# Patient Record
Sex: Female | Born: 1935
Health system: Southern US, Community
[De-identification: ages and names within clinical notes are randomized; demographics above are authoritative.]

## PROBLEM LIST (undated history)

## (undated) DIAGNOSIS — I10 Essential (primary) hypertension: Secondary | ICD-10-CM

## (undated) DIAGNOSIS — M810 Age-related osteoporosis without current pathological fracture: Secondary | ICD-10-CM

## (undated) DIAGNOSIS — F419 Anxiety disorder, unspecified: Secondary | ICD-10-CM

## (undated) DIAGNOSIS — K2970 Gastritis, unspecified, without bleeding: Secondary | ICD-10-CM

## (undated) DIAGNOSIS — J449 Chronic obstructive pulmonary disease, unspecified: Secondary | ICD-10-CM

## (undated) DIAGNOSIS — K219 Gastro-esophageal reflux disease without esophagitis: Secondary | ICD-10-CM

## (undated) DIAGNOSIS — R0600 Dyspnea, unspecified: Secondary | ICD-10-CM

## (undated) DIAGNOSIS — M1990 Unspecified osteoarthritis, unspecified site: Secondary | ICD-10-CM

## (undated) HISTORY — PX: COLONOSCOPY: SHX174

## (undated) HISTORY — PX: OTHER SURGICAL HISTORY: SHX169

## (undated) HISTORY — PX: LAPAROSCOPY: SHX197

---

## 2004-11-07 ENCOUNTER — Ambulatory Visit: Payer: Self-pay | Admitting: Internal Medicine

## 2005-04-27 ENCOUNTER — Ambulatory Visit: Payer: Self-pay | Admitting: Internal Medicine

## 2005-08-10 ENCOUNTER — Emergency Department: Payer: Self-pay | Admitting: Emergency Medicine

## 2005-08-10 ENCOUNTER — Other Ambulatory Visit: Payer: Self-pay

## 2005-08-17 ENCOUNTER — Ambulatory Visit: Payer: Self-pay | Admitting: Unknown Physician Specialty

## 2006-01-23 ENCOUNTER — Other Ambulatory Visit: Payer: Self-pay

## 2006-01-23 ENCOUNTER — Emergency Department: Payer: Self-pay | Admitting: Emergency Medicine

## 2006-01-24 ENCOUNTER — Ambulatory Visit: Payer: Self-pay | Admitting: Emergency Medicine

## 2006-07-10 ENCOUNTER — Ambulatory Visit: Payer: Self-pay

## 2006-07-22 ENCOUNTER — Ambulatory Visit: Payer: Self-pay | Admitting: Internal Medicine

## 2006-10-25 ENCOUNTER — Ambulatory Visit: Payer: Self-pay | Admitting: Unknown Physician Specialty

## 2007-03-04 ENCOUNTER — Ambulatory Visit: Payer: Self-pay | Admitting: Ophthalmology

## 2007-03-25 ENCOUNTER — Other Ambulatory Visit: Payer: Self-pay

## 2007-03-25 ENCOUNTER — Ambulatory Visit: Payer: Self-pay | Admitting: Ophthalmology

## 2007-04-22 ENCOUNTER — Ambulatory Visit: Payer: Self-pay | Admitting: Ophthalmology

## 2007-04-29 ENCOUNTER — Ambulatory Visit: Payer: Self-pay | Admitting: Ophthalmology

## 2007-08-13 ENCOUNTER — Ambulatory Visit: Payer: Self-pay | Admitting: Internal Medicine

## 2008-08-17 ENCOUNTER — Ambulatory Visit: Payer: Self-pay | Admitting: Internal Medicine

## 2008-11-21 ENCOUNTER — Emergency Department: Payer: Self-pay | Admitting: Internal Medicine

## 2009-08-18 ENCOUNTER — Ambulatory Visit: Payer: Self-pay | Admitting: Internal Medicine

## 2010-04-17 ENCOUNTER — Observation Stay: Payer: Self-pay | Admitting: Internal Medicine

## 2010-06-01 ENCOUNTER — Ambulatory Visit: Payer: Self-pay | Admitting: Internal Medicine

## 2010-06-13 ENCOUNTER — Ambulatory Visit: Payer: Self-pay | Admitting: Unknown Physician Specialty

## 2010-06-15 ENCOUNTER — Ambulatory Visit: Payer: Self-pay | Admitting: Internal Medicine

## 2010-08-25 ENCOUNTER — Ambulatory Visit: Payer: Self-pay

## 2011-07-10 ENCOUNTER — Emergency Department: Payer: Self-pay | Admitting: Emergency Medicine

## 2011-11-08 ENCOUNTER — Ambulatory Visit: Payer: Self-pay | Admitting: Internal Medicine

## 2012-11-10 ENCOUNTER — Ambulatory Visit: Payer: Self-pay | Admitting: Internal Medicine

## 2014-01-05 ENCOUNTER — Ambulatory Visit: Payer: Self-pay | Admitting: Internal Medicine

## 2014-05-03 ENCOUNTER — Inpatient Hospital Stay: Payer: Self-pay | Admitting: Internal Medicine

## 2014-05-03 LAB — URINALYSIS, COMPLETE
BACTERIA: NONE SEEN
Bilirubin,UR: NEGATIVE
Blood: NEGATIVE
GLUCOSE, UR: NEGATIVE mg/dL (ref 0–75)
KETONE: NEGATIVE
LEUKOCYTE ESTERASE: NEGATIVE
NITRITE: NEGATIVE
Ph: 9 (ref 4.5–8.0)
Protein: NEGATIVE
RBC,UR: 1 /HPF (ref 0–5)
Specific Gravity: 1.01 (ref 1.003–1.030)
Squamous Epithelial: NONE SEEN
WBC UR: NONE SEEN /HPF (ref 0–5)

## 2014-05-03 LAB — COMPREHENSIVE METABOLIC PANEL
ALT: 27 U/L (ref 12–78)
AST: 38 U/L — AB (ref 15–37)
Albumin: 4 g/dL (ref 3.4–5.0)
Alkaline Phosphatase: 91 U/L
Anion Gap: 8 (ref 7–16)
BUN: 10 mg/dL (ref 7–18)
Bilirubin,Total: 0.6 mg/dL (ref 0.2–1.0)
CHLORIDE: 89 mmol/L — AB (ref 98–107)
Calcium, Total: 9.9 mg/dL (ref 8.5–10.1)
Co2: 28 mmol/L (ref 21–32)
Creatinine: 0.76 mg/dL (ref 0.60–1.30)
Glucose: 131 mg/dL — ABNORMAL HIGH (ref 65–99)
Osmolality: 252 (ref 275–301)
Potassium: 4.3 mmol/L (ref 3.5–5.1)
Sodium: 125 mmol/L — ABNORMAL LOW (ref 136–145)
Total Protein: 7.3 g/dL (ref 6.4–8.2)

## 2014-05-03 LAB — CBC
HCT: 37.5 % (ref 35.0–47.0)
HGB: 12.5 g/dL (ref 12.0–16.0)
MCH: 31.3 pg (ref 26.0–34.0)
MCHC: 33.4 g/dL (ref 32.0–36.0)
MCV: 94 fL (ref 80–100)
PLATELETS: 262 10*3/uL (ref 150–440)
RBC: 4 10*6/uL (ref 3.80–5.20)
RDW: 13.6 % (ref 11.5–14.5)
WBC: 5.9 10*3/uL (ref 3.6–11.0)

## 2014-05-03 LAB — TSH: THYROID STIMULATING HORM: 2.74 u[IU]/mL

## 2014-05-04 LAB — BASIC METABOLIC PANEL
ANION GAP: 10 (ref 7–16)
BUN: 7 mg/dL (ref 7–18)
CO2: 24 mmol/L (ref 21–32)
CREATININE: 0.6 mg/dL (ref 0.60–1.30)
Calcium, Total: 9.4 mg/dL (ref 8.5–10.1)
Chloride: 96 mmol/L — ABNORMAL LOW (ref 98–107)
EGFR (African American): >60
EGFR (Non-African Amer.): >60
GLUCOSE: 118 mg/dL — AB (ref 65–99)
Osmolality: 260 (ref 275–301)
POTASSIUM: 3.4 mmol/L — AB (ref 3.5–5.1)
Sodium: 130 mmol/L — ABNORMAL LOW (ref 136–145)

## 2014-05-04 LAB — CBC WITH DIFFERENTIAL/PLATELET
BASOS ABS: 0 10*3/uL (ref 0.0–0.1)
Basophil %: 0.3 %
Eosinophil #: 0 10*3/uL (ref 0.0–0.7)
Eosinophil %: 0.1 %
HCT: 39.7 % (ref 35.0–47.0)
HGB: 13.3 g/dL (ref 12.0–16.0)
LYMPHS ABS: 1 10*3/uL (ref 1.0–3.6)
LYMPHS PCT: 12.9 %
MCH: 31 pg (ref 26.0–34.0)
MCHC: 33.6 g/dL (ref 32.0–36.0)
MCV: 92 fL (ref 80–100)
MONOS PCT: 6.9 %
Monocyte #: 0.5 x10 3/mm (ref 0.2–0.9)
Neutrophil #: 5.9 10*3/uL (ref 1.4–6.5)
Neutrophil %: 79.8 %
Platelet: 298 10*3/uL (ref 150–440)
RBC: 4.3 10*6/uL (ref 3.80–5.20)
RDW: 13.7 % (ref 11.5–14.5)
WBC: 7.4 10*3/uL (ref 3.6–11.0)

## 2014-05-04 LAB — LIPID PANEL
Cholesterol: 193 mg/dL (ref 0–200)
HDL: 90 mg/dL — AB (ref 40–60)
Ldl Cholesterol, Calc: 93 mg/dL (ref 0–100)
TRIGLYCERIDES: 52 mg/dL (ref 0–200)
VLDL CHOLESTEROL, CALC: 10 mg/dL (ref 5–40)

## 2014-05-04 LAB — MAGNESIUM: MAGNESIUM: 2 mg/dL

## 2014-05-04 LAB — TSH: Thyroid Stimulating Horm: 2.44 u[IU]/mL

## 2014-10-19 DIAGNOSIS — J301 Allergic rhinitis due to pollen: Secondary | ICD-10-CM | POA: Diagnosis not present

## 2014-10-19 DIAGNOSIS — I119 Hypertensive heart disease without heart failure: Secondary | ICD-10-CM | POA: Diagnosis not present

## 2014-10-19 DIAGNOSIS — E039 Hypothyroidism, unspecified: Secondary | ICD-10-CM | POA: Diagnosis not present

## 2014-10-19 DIAGNOSIS — M150 Primary generalized (osteo)arthritis: Secondary | ICD-10-CM | POA: Diagnosis not present

## 2014-12-02 DIAGNOSIS — F419 Anxiety disorder, unspecified: Secondary | ICD-10-CM | POA: Diagnosis not present

## 2014-12-02 DIAGNOSIS — I10 Essential (primary) hypertension: Secondary | ICD-10-CM | POA: Diagnosis not present

## 2014-12-02 DIAGNOSIS — E039 Hypothyroidism, unspecified: Secondary | ICD-10-CM | POA: Diagnosis not present

## 2014-12-06 DIAGNOSIS — R1013 Epigastric pain: Secondary | ICD-10-CM | POA: Diagnosis not present

## 2014-12-06 DIAGNOSIS — K219 Gastro-esophageal reflux disease without esophagitis: Secondary | ICD-10-CM | POA: Diagnosis not present

## 2014-12-06 DIAGNOSIS — Z8719 Personal history of other diseases of the digestive system: Secondary | ICD-10-CM | POA: Diagnosis not present

## 2014-12-10 DIAGNOSIS — K219 Gastro-esophageal reflux disease without esophagitis: Secondary | ICD-10-CM | POA: Insufficient documentation

## 2014-12-10 DIAGNOSIS — Z8719 Personal history of other diseases of the digestive system: Secondary | ICD-10-CM | POA: Insufficient documentation

## 2014-12-15 DIAGNOSIS — R1013 Epigastric pain: Secondary | ICD-10-CM | POA: Diagnosis not present

## 2015-01-20 DIAGNOSIS — M810 Age-related osteoporosis without current pathological fracture: Secondary | ICD-10-CM | POA: Diagnosis not present

## 2015-01-20 DIAGNOSIS — M150 Primary generalized (osteo)arthritis: Secondary | ICD-10-CM | POA: Diagnosis not present

## 2015-01-20 DIAGNOSIS — Z0001 Encounter for general adult medical examination with abnormal findings: Secondary | ICD-10-CM | POA: Diagnosis not present

## 2015-01-20 DIAGNOSIS — F411 Generalized anxiety disorder: Secondary | ICD-10-CM | POA: Diagnosis not present

## 2015-01-20 DIAGNOSIS — R300 Dysuria: Secondary | ICD-10-CM | POA: Diagnosis not present

## 2015-01-20 DIAGNOSIS — E039 Hypothyroidism, unspecified: Secondary | ICD-10-CM | POA: Diagnosis not present

## 2015-01-20 DIAGNOSIS — K219 Gastro-esophageal reflux disease without esophagitis: Secondary | ICD-10-CM | POA: Diagnosis not present

## 2015-02-05 NOTE — Discharge Summary (Signed)
PATIENT NAME:  Pam Cruz, BLASINGAME MR#:  [id number] DATE OF BIRTH:  1935/09/01  DATE OF ADMISSION:  05/03/2014 DATE OF DISCHARGE:  05/04/2014  PRIMARY CARE PHYSICIAN: Lavera Guise, MD  DISCHARGE DIAGNOSES: 1. Migraine.  2. Dehydration.  3. Hypertension.  4. Hyponatremia due to hydrochlorothiazide and dehydration.  5. Gastroesophageal reflux disease.  6. Arthritis.  7. Asthma.   DISCHARGE MEDICATIONS: 1. Levothyroxine 50 mcg oral once a day.  2. Desipramine 25 mg 1/2 tablet oral once a day at bedtime.  3. Tylenol arthritis twice a day as needed.  4. Omeprazole 40 mg daily.  5. Carafate 1 gram 4 times daily with meals.  6. Klonopin 0.5 mg by mouth at bedtime.  7. PreserVision 1 capsule oral once a day.  8. Calcium/vitamin D 1 tablet daily.  9. Montelukast 10 mg oral once a day.  10. Advair Diskus 1 puff inhaled 2 times a day.  11. Fluticasone propionate nasal twice a day.  12. Amlodipine 5 mg 2 tablets daily.  13. Hydralazine 50 mg oral 3 times a day.  14. Cozaar 100 mg oral once a day.  15. Fioricet 1 tablet oral every 6 hours as needed.  16. Zofran 4 mg oral every 6 hours as needed for nausea.   IMAGING STUDIES DONE: Include a CT scan of the head without contrast which showed no acute abnormalities. Mild atrophy.   ADMITTING HISTORY AND PHYSICAL: Please see detailed H and P dictated previously by Dr. Margaretmary Eddy. In brief, a 80 year old female patient with history of hypertension, asthma who presented to the hospital complaining of headache and intractable nausea. The patient had a CT scan of the head, which was normal and found to be hyponatremic; was admitted to the hospitalist service.   HOSPITAL COURSE: 1. Migraine. Patient had headaches, nausea, photophobia, was diagnosed with migraine after CT head was normal. Did not have any focal neurological deficits. She was treated with the prochlorperazine, Toradol and IV fluids, with which she improved well. Her headache had resolved by the  time of discharge. She ambulated in the hallway without any problems, felt back to baseline. She was treated symptomatically for her nausea and vomiting. Did not have any abdominal tenderness on examination prior to discharge. Motor strength was 5/5 in upper and lower extremities. Sensation was intact all over.  2. Hyponatremia. Secondary to hydrochlorothiazide and dehydration. The patient's hydrochlorothiazide was stopped. She was rehydrated with IV fluids with which her sodium is improving. She does seem to have some mild hyponatremia and this has been asymptomatic.  3. Uncontrolled hypertension with stopping her hydrochlorothiazide. The patient's blood pressure was uncontrolled but was treated with IV p.r.n. medications along with increasing Cozaar and amlodipine doses and added hydroxyzine, and blood pressure is much improved prior to discharge.   DISCHARGE INSTRUCTIONS:  1. Low-salt diet.  2. Activity as tolerated.  3. Follow up with primary care physician in 1 to 2 days.    TIME SPENT ON DISCHARGE: 45 minutes.    ____________________________ Leia Alf Murl Golladay, MD srs:lm D: 05/10/2014 14:00:20 ET T: 05/11/2014 01:28:43 ET JOB#: [id number]  cc: Alveta Heimlich R. Darvin Neighbours, MD, <Dictator> Lavera Guise, MD Neita Carp MD ELECTRONICALLY SIGNED 05/26/2014 15:44

## 2015-02-05 NOTE — H&P (Signed)
PATIENT NAME:  Pam Cruz, Pam Cruz MR#:  [id number] DATE OF BIRTH:  Jun 06, 1935  DATE OF ADMISSION:  05/04/2014  PRIMARY CARE PHYSICIAN: Dr. [name]   REFERRING PHYSICIAN: Dr. Jacqualine Code  CHIEF COMPLAINT: Headache and intractable nausea.   HISTORY OF PRESENT ILLNESS: The patient is a 80 year old, pleasant, thin-looking Caucasian female who is presenting to the ED with a chief complaint of headache and nausea. The patient is reporting that she woke up this morning with severe headache associated with nausea. She states that the whole day yesterday, she was unable to eat or drink because of the intractable nausea. She has some looseness of bowel movements, but denies any blood in her stool. Denies any vomiting. She had similar complaints approximately one month ago and was evaluated by her primary care physician, Dr. [name] PA, who ordered some lab work. The patient reports that, according to the lab work which was done recently, her sodium was low. The patient was recommended to drink Gatorade by the PA. Yesterday morning again, she started having severe headache associated with nausea. She called her primary care physician's office, and they took an appointment for tomorrow at 11:00 a.m. As the patient's nausea was persistent and that she was not able to eat or drink for the whole day yesterday, she was feeling dizzy. She was feeling extremely weak and tired also. She came into the ED, and her sodium was low at 1.85. The patient reports that she usually gets headache whenever her sinuses are acting up. She denies any shortness of breath, denies any chest pain. She has somewhat epigastric discomfort. She has chronic history of gastritis, and takes Carafate regarding that.   PAST MEDICAL HISTORY: Gastroesophageal reflux disease, chronic gastritis, chronic obstructive pulmonary disease, benign hypertension, osteoarthritis, osteoporosis, and hypothyroidism.   PAST SURGICAL HISTORY: None.   ALLERGIES: SHE  IS ALLERGIC TO AMOXICILLIN, CODEIN, DARVOCET-N, ERYTHROMYCIN, PENICILLIN, REGLAN, SULFA.  PSYCHOSOCIAL HISTORY: Lives at home, lives alone. She used to smoke, but quit smoking in the year 2004. Occasional intake of alcohol. Denies any illicit drug usage.   FAMILY HISTORY: Hypertension runs in her family. Also coronary artery disease runs in her family.   HOME MEDICATIONS: Tylenol Arthritis extended release 1 tablet p.r.n., omeprazole 40 mg p.o. once daily, montelukast 10 mg once daily, levothyroxine 50 mcg p.o. once daily, Klonopin 4.5 mg by mouth at bedtime, hydrochlorothiazide/losartan 12.5/50 once daily, desipramine 20 mg 1/2 tablet once a day, Carafate 1 gram 4 times a day, calcium with vitamin D daily, amlodipine 5 mg once daily, Advair 250/50 1 puff inhalation 2 times a day.   REVIEW OF SYSTEMS: CONSTITUTIONAL: Denies any fever, fatigue. Denies any weight loss or weight gain. The patient is chronically thin, as she watches her diet.  EYES: Denies blurry vision, double vision. Denies any glaucoma or cataracts.  EARS, NOSE, THROAT: Denies epistaxis, discharge, snoring.  SOCIAL HISTORY: Denies cough or wheezing. Has chronic history of chronic obstructive pulmonary disease.  CARDIOVASCULAR: Denies any chest pain or palpitations, but complaining of dizziness.  GASTROINTESTINAL: Complaining of persistent nausea. Denies any vomiting. Has loose bowel movements. Complaining of epigastric abdominal pain. No hematemesis or melena.  GENITOURINARY: No dysuria, hematuria, melena, calculi, or frequency.  GYNECOLOGIC AND BREASTS: Denies any breast mass or vaginal discharge.  ENDOCRINE: Denies any polyuria, nocturia. Has a chronic history of hypothyroidism.  HEMATOLOGIC AND LYMPHATIC: No anemia, easy bruising, bleeding.  INTEGUMENTARY: No acne, rash, lesions.  MUSCULOSKELETAL: No joint pain in the neck and back. Denies gout.  NEUROLOGIC: Denies vertigo, ataxia, dementia. PSYCHIATRIC: No ADD or OCD.    PHYSICAL EXAMINATION: VITAL SIGNS: Temperature 97.5, pulse 60, respirations 18, blood pressure 173/99, pulse oximetry is 100%.  GENERAL APPEARANCE: Not in acute distress. Moderately built and thin-looking female under no apparent distress.  HEENT: Normocephalic, atraumatic. Pupils are equal, reacting to light and accommodation. No scleral icterus. No conjunctival injection. No sinus tenderness. No postnasal drip. Dry mucous membranes.  NECK: Supple. No JVD. No thyromegaly. Range of motion is intact.  LUNGS: Clear to auscultation bilaterally. No accessory muscle use. No anterior chest wall tenderness on palpation.  CARDIAC: S1, S2 normal. Regular rate and rhythm. No murmurs.  ABDOMEN: Soft. Bowel sounds are positive in all four quadrants. Nontender, but positive epigastric discomfort. Not distended. No masses felt.  NEUROLOGIC: Awake, alert and oriented x3. Cranial nerves II through XII are grossly intact. Motor and sensory are intact. Reflexes are 2+.  EXTREMITIES: No edema. No cyanosis. No clubbing.  SKIN: Warm to touch. Normal turgor. No rashes. No lesions.  MUSCULOSKELETAL: No joint effusion, tenderness, erythema.  PSYCHIATRIC: Normal mood and affect.   LABORATORY AND IMAGING STUDIES: TSH is 2.74. CBC is normal. Urinalysis: Yellow in color, cloudy in appearance, nitrites are negative, leukocyte esterase is negative. LFTs are normal except AST of 38. BMP: Glucose 131, BUN 10, creatinine 0.76. Sodium is 125, but the patient is reporting that chronically her sodium is low, but she does not know the exact number. Potassium 4.3, chloride is 89. The rest of the BMP is normal. CT head is normal, with no acute findings.   ASSESSMENT AND PLAN: A 80 year old Caucasian female, presenting to the Emergency Department with a chief complaint of headache and nausea since yesterday morning. She could not tolerate oral intake for the whole day yesterday.   1.  Hyponatremia, probably from dehydration from  persistent nausea, as the patient was unable to tolerate oral intake, adverse effects of hydrochlorothiazide. The plan is provide her IV fluids, antinausea medications. Gastrointestinal prophylaxis with ranitidine and Carafate. Hold  hydrochlorothiazide. Repeat BMP. Check TSH. Provide IV fluids. Follow serial BMP. If necessary, will consider nephrology consult. We will try to get records from the primary care physician's office to get her baseline.  2.  Acute on chronic headaches, probably from elevated blood pressure as well as sinusitis. CT head is negative. Will provide her Tylenol as needed. 3.  Hypertension, elevated. Hold hydrochlorothiazide in view of hyponatremia. Increase amlodipine dose to 10 mg and continue losartan at 50 mg. Provide her IV Lopressor on as-needed basis.  4.  Chronic history of chronic obstructive pulmonary disease, not in exacerbation. We will provide nebulizer treatments on as-needed basis and continue Singulair.  5.  Chronic history of hypothyroidism. Continue Synthroid.  6.  Osteoporosis. Continue calcium with vitamin D supplements.  7.  Will provide gastrointestinal and deep vein thrombosis prophylaxis.   Plan of care discussed with the patient in detail. She is aware of the plan.   CODE STATUS: She is full code. Brother is the medical power of attorney.   TOTAL TIME SPENT ON ADMISSION: 50 minutes.    ____________________________ Nicholes Mango, MD ag:cg D: 05/04/2014 92:11:94 ET T: 05/04/2014 00:56:16 ET JOB#: [id number]  cc: Nicholes Mango, MD, <Dictator> Lavera Guise, MD Nicholes Mango MD ELECTRONICALLY SIGNED 05/05/2014 2:01

## 2015-03-10 DIAGNOSIS — Z1231 Encounter for screening mammogram for malignant neoplasm of breast: Secondary | ICD-10-CM | POA: Diagnosis not present

## 2015-04-19 DIAGNOSIS — H3530 Unspecified macular degeneration: Secondary | ICD-10-CM | POA: Diagnosis not present

## 2015-05-24 DIAGNOSIS — M810 Age-related osteoporosis without current pathological fracture: Secondary | ICD-10-CM | POA: Diagnosis not present

## 2015-05-24 DIAGNOSIS — F411 Generalized anxiety disorder: Secondary | ICD-10-CM | POA: Diagnosis not present

## 2015-05-24 DIAGNOSIS — M150 Primary generalized (osteo)arthritis: Secondary | ICD-10-CM | POA: Diagnosis not present

## 2015-05-24 DIAGNOSIS — I119 Hypertensive heart disease without heart failure: Secondary | ICD-10-CM | POA: Diagnosis not present

## 2015-05-24 DIAGNOSIS — G2581 Restless legs syndrome: Secondary | ICD-10-CM | POA: Diagnosis not present

## 2015-07-28 DIAGNOSIS — M8580 Other specified disorders of bone density and structure, unspecified site: Secondary | ICD-10-CM | POA: Diagnosis not present

## 2015-07-28 DIAGNOSIS — M810 Age-related osteoporosis without current pathological fracture: Secondary | ICD-10-CM | POA: Diagnosis not present

## 2015-07-28 DIAGNOSIS — M8588 Other specified disorders of bone density and structure, other site: Secondary | ICD-10-CM | POA: Diagnosis not present

## 2015-07-28 DIAGNOSIS — E2839 Other primary ovarian failure: Secondary | ICD-10-CM | POA: Diagnosis not present

## 2015-07-28 DIAGNOSIS — Z780 Asymptomatic menopausal state: Secondary | ICD-10-CM | POA: Diagnosis not present

## 2015-09-27 DIAGNOSIS — E039 Hypothyroidism, unspecified: Secondary | ICD-10-CM | POA: Diagnosis not present

## 2015-09-27 DIAGNOSIS — F411 Generalized anxiety disorder: Secondary | ICD-10-CM | POA: Diagnosis not present

## 2015-09-27 DIAGNOSIS — M810 Age-related osteoporosis without current pathological fracture: Secondary | ICD-10-CM | POA: Diagnosis not present

## 2015-09-27 DIAGNOSIS — I119 Hypertensive heart disease without heart failure: Secondary | ICD-10-CM | POA: Diagnosis not present

## 2015-09-27 DIAGNOSIS — M150 Primary generalized (osteo)arthritis: Secondary | ICD-10-CM | POA: Diagnosis not present

## 2015-10-26 DIAGNOSIS — K219 Gastro-esophageal reflux disease without esophagitis: Secondary | ICD-10-CM | POA: Diagnosis not present

## 2015-10-26 DIAGNOSIS — K589 Irritable bowel syndrome without diarrhea: Secondary | ICD-10-CM | POA: Diagnosis not present

## 2015-10-31 DIAGNOSIS — E039 Hypothyroidism, unspecified: Secondary | ICD-10-CM | POA: Diagnosis not present

## 2015-10-31 DIAGNOSIS — I10 Essential (primary) hypertension: Secondary | ICD-10-CM | POA: Diagnosis not present

## 2015-12-06 DIAGNOSIS — K219 Gastro-esophageal reflux disease without esophagitis: Secondary | ICD-10-CM | POA: Diagnosis not present

## 2015-12-06 DIAGNOSIS — E039 Hypothyroidism, unspecified: Secondary | ICD-10-CM | POA: Diagnosis not present

## 2015-12-06 DIAGNOSIS — M810 Age-related osteoporosis without current pathological fracture: Secondary | ICD-10-CM | POA: Diagnosis not present

## 2015-12-06 DIAGNOSIS — I119 Hypertensive heart disease without heart failure: Secondary | ICD-10-CM | POA: Diagnosis not present

## 2016-01-30 DIAGNOSIS — E782 Mixed hyperlipidemia: Secondary | ICD-10-CM | POA: Diagnosis not present

## 2016-01-30 DIAGNOSIS — I10 Essential (primary) hypertension: Secondary | ICD-10-CM | POA: Diagnosis not present

## 2016-01-30 DIAGNOSIS — E039 Hypothyroidism, unspecified: Secondary | ICD-10-CM | POA: Diagnosis not present

## 2016-01-30 DIAGNOSIS — Z0001 Encounter for general adult medical examination with abnormal findings: Secondary | ICD-10-CM | POA: Diagnosis not present

## 2016-02-07 DIAGNOSIS — I10 Essential (primary) hypertension: Secondary | ICD-10-CM | POA: Diagnosis not present

## 2016-02-07 DIAGNOSIS — Z0001 Encounter for general adult medical examination with abnormal findings: Secondary | ICD-10-CM | POA: Diagnosis not present

## 2016-02-07 DIAGNOSIS — E039 Hypothyroidism, unspecified: Secondary | ICD-10-CM | POA: Diagnosis not present

## 2016-02-07 DIAGNOSIS — E875 Hyperkalemia: Secondary | ICD-10-CM | POA: Diagnosis not present

## 2016-03-02 DIAGNOSIS — E875 Hyperkalemia: Secondary | ICD-10-CM | POA: Diagnosis not present

## 2016-03-26 DIAGNOSIS — M545 Low back pain: Secondary | ICD-10-CM | POA: Diagnosis not present

## 2016-03-26 DIAGNOSIS — J209 Acute bronchitis, unspecified: Secondary | ICD-10-CM | POA: Diagnosis not present

## 2016-04-03 DIAGNOSIS — J4540 Moderate persistent asthma, uncomplicated: Secondary | ICD-10-CM | POA: Diagnosis not present

## 2016-04-03 DIAGNOSIS — J301 Allergic rhinitis due to pollen: Secondary | ICD-10-CM | POA: Diagnosis not present

## 2016-06-12 DIAGNOSIS — J449 Chronic obstructive pulmonary disease, unspecified: Secondary | ICD-10-CM | POA: Diagnosis not present

## 2016-06-12 DIAGNOSIS — E039 Hypothyroidism, unspecified: Secondary | ICD-10-CM | POA: Diagnosis not present

## 2016-06-12 DIAGNOSIS — I10 Essential (primary) hypertension: Secondary | ICD-10-CM | POA: Diagnosis not present

## 2016-06-12 DIAGNOSIS — M810 Age-related osteoporosis without current pathological fracture: Secondary | ICD-10-CM | POA: Diagnosis not present

## 2016-06-19 DIAGNOSIS — Z1231 Encounter for screening mammogram for malignant neoplasm of breast: Secondary | ICD-10-CM | POA: Diagnosis not present

## 2016-10-02 DIAGNOSIS — I10 Essential (primary) hypertension: Secondary | ICD-10-CM | POA: Diagnosis not present

## 2016-10-02 DIAGNOSIS — K219 Gastro-esophageal reflux disease without esophagitis: Secondary | ICD-10-CM | POA: Diagnosis not present

## 2016-10-02 DIAGNOSIS — M150 Primary generalized (osteo)arthritis: Secondary | ICD-10-CM | POA: Diagnosis not present

## 2016-10-02 DIAGNOSIS — E039 Hypothyroidism, unspecified: Secondary | ICD-10-CM | POA: Diagnosis not present

## 2016-11-01 ENCOUNTER — Emergency Department
Admission: EM | Admit: 2016-11-01 | Discharge: 2016-11-01 | Disposition: A | Discharge status: Home / Self Care | Payer: Medicare Other | Attending: Emergency Medicine | Admitting: Emergency Medicine

## 2016-11-01 ENCOUNTER — Emergency Department: Payer: Medicare Other

## 2016-11-01 ENCOUNTER — Encounter: Payer: Self-pay | Admitting: Emergency Medicine

## 2016-11-01 DIAGNOSIS — R079 Chest pain, unspecified: Secondary | ICD-10-CM | POA: Diagnosis not present

## 2016-11-01 DIAGNOSIS — I10 Essential (primary) hypertension: Secondary | ICD-10-CM | POA: Diagnosis not present

## 2016-11-01 DIAGNOSIS — J449 Chronic obstructive pulmonary disease, unspecified: Secondary | ICD-10-CM | POA: Diagnosis not present

## 2016-11-01 DIAGNOSIS — R1013 Epigastric pain: Secondary | ICD-10-CM | POA: Insufficient documentation

## 2016-11-01 DIAGNOSIS — R109 Unspecified abdominal pain: Secondary | ICD-10-CM | POA: Diagnosis not present

## 2016-11-01 DIAGNOSIS — R0789 Other chest pain: Secondary | ICD-10-CM

## 2016-11-01 HISTORY — DX: Unspecified osteoarthritis, unspecified site: M19.90

## 2016-11-01 HISTORY — DX: Gastritis, unspecified, without bleeding: K29.70

## 2016-11-01 HISTORY — DX: Gastro-esophageal reflux disease without esophagitis: K21.9

## 2016-11-01 HISTORY — DX: Chronic obstructive pulmonary disease, unspecified: J44.9

## 2016-11-01 HISTORY — DX: Age-related osteoporosis without current pathological fracture: M81.0

## 2016-11-01 HISTORY — DX: Essential (primary) hypertension: I10

## 2016-11-01 LAB — TROPONIN I
Troponin I: <0.03 ng/mL (ref <0.03)
Troponin I: <0.03 ng/mL (ref <0.03)

## 2016-11-01 LAB — BASIC METABOLIC PANEL
Anion gap: 7 (ref 5–15)
BUN: 11 mg/dL (ref 6–20)
CHLORIDE: 101 mmol/L (ref 101–111)
CO2: 25 mmol/L (ref 22–32)
Calcium: 9.7 mg/dL (ref 8.9–10.3)
Creatinine, Ser: 0.65 mg/dL (ref 0.44–1.00)
GFR calc Af Amer: >60 mL/min (ref >60)
GFR calc non Af Amer: >60 mL/min (ref >60)
GLUCOSE: 93 mg/dL (ref 65–99)
POTASSIUM: 3.8 mmol/L (ref 3.5–5.1)
Sodium: 133 mmol/L — ABNORMAL LOW (ref 135–145)

## 2016-11-01 LAB — CBC
HEMATOCRIT: 39.3 % (ref 35.0–47.0)
Hemoglobin: 13.6 g/dL (ref 12.0–16.0)
MCH: 31.9 pg (ref 26.0–34.0)
MCHC: 34.5 g/dL (ref 32.0–36.0)
MCV: 92.4 fL (ref 80.0–100.0)
Platelets: 282 10*3/uL (ref 150–440)
RBC: 4.25 MIL/uL (ref 3.80–5.20)
RDW: 13.6 % (ref 11.5–14.5)
WBC: 5.9 10*3/uL (ref 3.6–11.0)

## 2016-11-01 NOTE — ED Notes (Signed)
Pt is in good condition; discharge instructions reviewed, follow up care reviewed; pt verbalized understanding; pt is ambulatory, but sent to ED lobby in a wheelchair to await a ride to pick her up.

## 2016-11-01 NOTE — ED Notes (Signed)
Assisted pt to ambulate to the bathroom; pt tolerated it well.

## 2016-11-01 NOTE — ED Triage Notes (Signed)
Per [facility], patient comes from home with c/o CP. Pain started when patient was being over to put her socks on. Patient described it as a sharp/cramping pain. Patient points to epigastric area. No cardiac hx. Patient in right BBB on monitor. Initial BP on scene 180/120. Patient A&O x4.

## 2016-11-01 NOTE — ED Notes (Signed)
Pt requesting to know how much longer for cardiac enzyme to result. Informed pt results where not back yet and will let her know as soon as her troponin level results. Pt verbally acknowledged understanding. Pt alert, NAD noted at this time.

## 2016-11-01 NOTE — Discharge Instructions (Signed)
Please continue all your current medications.  Please return immediately if condition worsens. Please contact her primary physician or the physician you were given for referral. If you have any specialist physicians involved in her treatment and plan please also contact them. Thank you for using  regional emergency Department.

## 2016-11-01 NOTE — ED Notes (Signed)
Patient transported to X-ray 

## 2016-11-01 NOTE — ED Provider Notes (Signed)
Time Seen: Approximately 1655  I have reviewed the triage notes  Chief Complaint: Chest Pain   History of Present Illness: Pam Cruz is a 82 y.o. female who was transported here by EMS after a approximately 30-35 minute episode of some epigastric and lower chest discomfort. Patient states she bent over to put her socks on and noticed of a sharp crampy pain and points primarily again to the epigastric area. Patient denies any persistent nausea, vomiting, shortness of breath, diaphoresis. She denies any pain at present and has a history of hypertension and gastroesophageal reflux disease. She denies any radiation of the pain to the back or flank area.   Past Medical History:  Diagnosis Date  . Arthritis   . COPD (chronic obstructive pulmonary disease) ([place])   . Gastritis   . GERD (gastroesophageal reflux disease)   . Hypertension   . Osteoporosis     There are no active problems to display for this patient.   No past surgical history on file.  No past surgical history on file.    Allergies:  Penicillins  Family History: No family history on file.  Social History: Social History  Substance Use Topics  . Smoking status: Never Smoker  . Smokeless tobacco: Never Used  . Alcohol use No     Review of Systems:   10 point review of systems was performed and was otherwise negative:  Constitutional: No fever Eyes: No visual disturbances ENT: No sore throat, ear pain Cardiac: Mild lower midline chest pain. Patient states that currently she is pain free Respiratory: No shortness of breath, wheezing, or stridor Abdomen: Epigastric abdominal pain, no vomiting, No diarrhea. Patient denies any lower abdominal pain, flank pain, midline to low back discomfort. Endocrine: No weight loss, No night sweats Extremities: No peripheral edema, cyanosis Skin: No rashes, easy bruising Neurologic: No focal weakness, trouble with speech or swollowing Urologic: No dysuria,  Hematuria, or urinary frequency   Physical Exam:  ED Triage Vitals [11/01/16 1640]  Enc Vitals Group     BP (!) 165/105     Pulse Rate 69     Resp 18     Temp 98 F (36.7 C)     Temp Source Oral     SpO2 100 %     Weight 100 lb (45.4 kg)     Height 5' (1.524 m)     Head Circumference      Peak Flow      Pain Score      Pain Loc      Pain Edu?      Excl. in [place]?     General: Awake , Alert , and Oriented times 3; GCS 15 Patient's very anxious otherwise in no apparent distress Head: Normal cephalic , atraumatic Eyes: Pupils equal , round, reactive to light Nose/Throat: No nasal drainage, patent upper airway without erythema or exudate.  Neck: Supple, Full range of motion, No anterior adenopathy or palpable thyroid masses Lungs: Clear to ascultation without wheezes , rhonchi, or rales Heart: Regular rate, regular rhythm without murmurs , gallops , or rubs Abdomen: Soft, non tender without rebound, guarding , or rigidity; bowel sounds positive and symmetric in all 4 quadrants. No organomegaly .        Extremities: 2 plus symmetric pulses. No edema, clubbing or cyanosis Neurologic: normal ambulation, Motor symmetric without deficits, sensory intact Skin: warm, dry, no rashes   Labs:   All laboratory work was reviewed including any pertinent negatives or  positives listed below:  Labs Reviewed  BASIC METABOLIC PANEL - Abnormal; Notable for the following:       Result Value   Sodium 133 (*)    All other components within normal limits  CBC  TROPONIN I  TROPONIN I  Initial and repeat troponin was negative  EKG:  ED ECG REPORT I, Daymon Larsen, the attending physician, personally viewed and interpreted this ECG.  Date: 11/01/2016 EKG Time:1651 Rate: 54 Rhythm: normal sinus rhythm QRS Axis: normal Intervals: Mild right bundle-branch block pattern and a left anterior fascicular block ST/T Wave abnormalities: normal Conduction Disturbances: none Narrative  Interpretation: unremarkable No acute ischemic changes noted   Radiology: * "Dg Chest 2 View  Result Date: 11/01/2016 CLINICAL DATA:  Chest pain, epigastric. EXAM: CHEST  2 VIEW COMPARISON:  Chest x-rays dated 04/17/2010 and 11/21/2008. FINDINGS: Heart size and mediastinal contours are normal. Atherosclerotic changes noted at the aortic arch. Lungs are hyperexpanded. Lungs are clear. No pleural effusion or pneumothorax seen. No acute or suspicious osseous finding. At least mild degenerative change noted within the upper lumbar spine, incompletely imaged. IMPRESSION: 1. No active cardiopulmonary disease. No evidence of pneumonia or pulmonary edema. 2. Hyperexpanded lungs indicating COPD/emphysema. 3. Aortic atherosclerosis. Electronically Signed   By: Franki Cabot M.D.   On: 11/01/2016 18:14  "  I personally reviewed the radiologic studies    ED Course: * Differential includes all life-threatening causes for chest pain. This includes but is not exclusive to acute coronary syndrome, aortic dissection, pulmonary embolism, cardiac tamponade, community-acquired pneumonia, pericarditis, musculoskeletal chest wall pain, etc.  Given the patient's current clinical presentation and objective findings I did not see any obvious indications of a life-threatening cause for chest pain. She does have a history of reflux. Patient's otherwise hemodynamically stable and does not present with a surgical abdomen. Initial and repeat troponin was negative. Patient was advised contact her primary physician for further outpatient follow-up     Assessment: Acute unspecified chest pain   Final Clinical Impression:  Final diagnoses:  Atypical chest pain     Plan: * Outpatient management Patient was advised continue with all her current medications. Patient was advised to return immediately if condition worsens. Patient was advised to follow up with their primary care physician or other specialized physicians  involved in their outpatient care. The patient and/or family member/power of attorney had laboratory results reviewed at the bedside. All questions and concerns were addressed and appropriate discharge instructions were distributed by the nursing staff.             Daymon Larsen, MD 11/01/16 [phone]

## 2016-11-12 ENCOUNTER — Other Ambulatory Visit: Payer: Self-pay | Admitting: Unknown Physician Specialty

## 2016-11-12 DIAGNOSIS — R0789 Other chest pain: Secondary | ICD-10-CM | POA: Diagnosis not present

## 2016-11-13 ENCOUNTER — Other Ambulatory Visit: Payer: Self-pay | Admitting: Nurse Practitioner

## 2016-11-13 DIAGNOSIS — R0602 Shortness of breath: Secondary | ICD-10-CM

## 2016-11-13 DIAGNOSIS — I10 Essential (primary) hypertension: Secondary | ICD-10-CM | POA: Diagnosis not present

## 2016-11-13 DIAGNOSIS — J4540 Moderate persistent asthma, uncomplicated: Secondary | ICD-10-CM | POA: Diagnosis not present

## 2016-11-13 DIAGNOSIS — R079 Chest pain, unspecified: Secondary | ICD-10-CM

## 2016-11-13 DIAGNOSIS — K219 Gastro-esophageal reflux disease without esophagitis: Secondary | ICD-10-CM | POA: Diagnosis not present

## 2016-11-14 DIAGNOSIS — R079 Chest pain, unspecified: Secondary | ICD-10-CM | POA: Diagnosis not present

## 2016-11-21 ENCOUNTER — Ambulatory Visit
Admission: RE | Admit: 2016-11-21 | Discharge: 2016-11-21 | Disposition: A | Discharge status: Home / Self Care | Payer: Medicare Other | Source: Ambulatory Visit | Attending: Unknown Physician Specialty | Admitting: Unknown Physician Specialty

## 2016-11-21 ENCOUNTER — Ambulatory Visit: Payer: Medicare Other

## 2016-11-21 DIAGNOSIS — K228 Other specified diseases of esophagus: Secondary | ICD-10-CM | POA: Insufficient documentation

## 2016-11-21 DIAGNOSIS — R0602 Shortness of breath: Secondary | ICD-10-CM | POA: Insufficient documentation

## 2016-11-21 DIAGNOSIS — K219 Gastro-esophageal reflux disease without esophagitis: Secondary | ICD-10-CM | POA: Diagnosis not present

## 2016-11-21 DIAGNOSIS — R0789 Other chest pain: Secondary | ICD-10-CM | POA: Insufficient documentation

## 2016-11-28 ENCOUNTER — Ambulatory Visit: Payer: Medicare Other

## 2017-02-06 DIAGNOSIS — J301 Allergic rhinitis due to pollen: Secondary | ICD-10-CM | POA: Diagnosis not present

## 2017-02-06 DIAGNOSIS — K219 Gastro-esophageal reflux disease without esophagitis: Secondary | ICD-10-CM | POA: Diagnosis not present

## 2017-02-06 DIAGNOSIS — J4540 Moderate persistent asthma, uncomplicated: Secondary | ICD-10-CM | POA: Diagnosis not present

## 2017-02-06 DIAGNOSIS — I10 Essential (primary) hypertension: Secondary | ICD-10-CM | POA: Diagnosis not present

## 2017-02-06 DIAGNOSIS — Z0001 Encounter for general adult medical examination with abnormal findings: Secondary | ICD-10-CM | POA: Diagnosis not present

## 2017-06-12 DIAGNOSIS — H353131 Nonexudative age-related macular degeneration, bilateral, early dry stage: Secondary | ICD-10-CM | POA: Diagnosis not present

## 2017-07-04 DIAGNOSIS — D509 Iron deficiency anemia, unspecified: Secondary | ICD-10-CM | POA: Diagnosis not present

## 2017-07-04 DIAGNOSIS — H8149 Vertigo of central origin, unspecified ear: Secondary | ICD-10-CM | POA: Diagnosis not present

## 2017-07-04 DIAGNOSIS — E875 Hyperkalemia: Secondary | ICD-10-CM | POA: Diagnosis not present

## 2017-07-04 DIAGNOSIS — E039 Hypothyroidism, unspecified: Secondary | ICD-10-CM | POA: Diagnosis not present

## 2017-07-04 DIAGNOSIS — I10 Essential (primary) hypertension: Secondary | ICD-10-CM | POA: Diagnosis not present

## 2017-08-08 DIAGNOSIS — E039 Hypothyroidism, unspecified: Secondary | ICD-10-CM | POA: Diagnosis not present

## 2017-08-08 DIAGNOSIS — I10 Essential (primary) hypertension: Secondary | ICD-10-CM | POA: Diagnosis not present

## 2017-08-08 DIAGNOSIS — E871 Hypo-osmolality and hyponatremia: Secondary | ICD-10-CM | POA: Diagnosis not present

## 2017-08-08 DIAGNOSIS — H8149 Vertigo of central origin, unspecified ear: Secondary | ICD-10-CM | POA: Diagnosis not present

## 2017-08-08 DIAGNOSIS — R042 Hemoptysis: Secondary | ICD-10-CM | POA: Diagnosis not present

## 2017-08-08 DIAGNOSIS — R05 Cough: Secondary | ICD-10-CM | POA: Diagnosis not present

## 2017-09-11 DIAGNOSIS — H40053 Ocular hypertension, bilateral: Secondary | ICD-10-CM | POA: Diagnosis not present

## 2017-10-14 ENCOUNTER — Other Ambulatory Visit: Payer: Self-pay | Admitting: Internal Medicine

## 2017-11-04 ENCOUNTER — Other Ambulatory Visit: Payer: Self-pay | Admitting: Internal Medicine

## 2017-11-04 NOTE — Telephone Encounter (Signed)
CAN  YOU SEND HER CLONAZEPAM

## 2017-11-06 DIAGNOSIS — R1013 Epigastric pain: Secondary | ICD-10-CM | POA: Diagnosis not present

## 2017-11-25 ENCOUNTER — Other Ambulatory Visit: Payer: Self-pay | Admitting: Internal Medicine

## 2017-12-23 ENCOUNTER — Other Ambulatory Visit: Payer: Self-pay | Admitting: Internal Medicine

## 2018-02-11 ENCOUNTER — Encounter: Payer: Self-pay | Admitting: Nurse Practitioner

## 2018-02-16 IMAGING — CR DG CHEST 2V
1 series · 2 of 2 positions shown · non-contrast
Comparison: Chest x-rays dated 04/17/2010 and 11/21/2008.

CLINICAL DATA: Chest pain, epigastric.

EXAM:
CHEST  2 VIEW

[Series 1: w chest pa · 0.14mm/px · 2 of 2 slices shown]
[im 1/2]
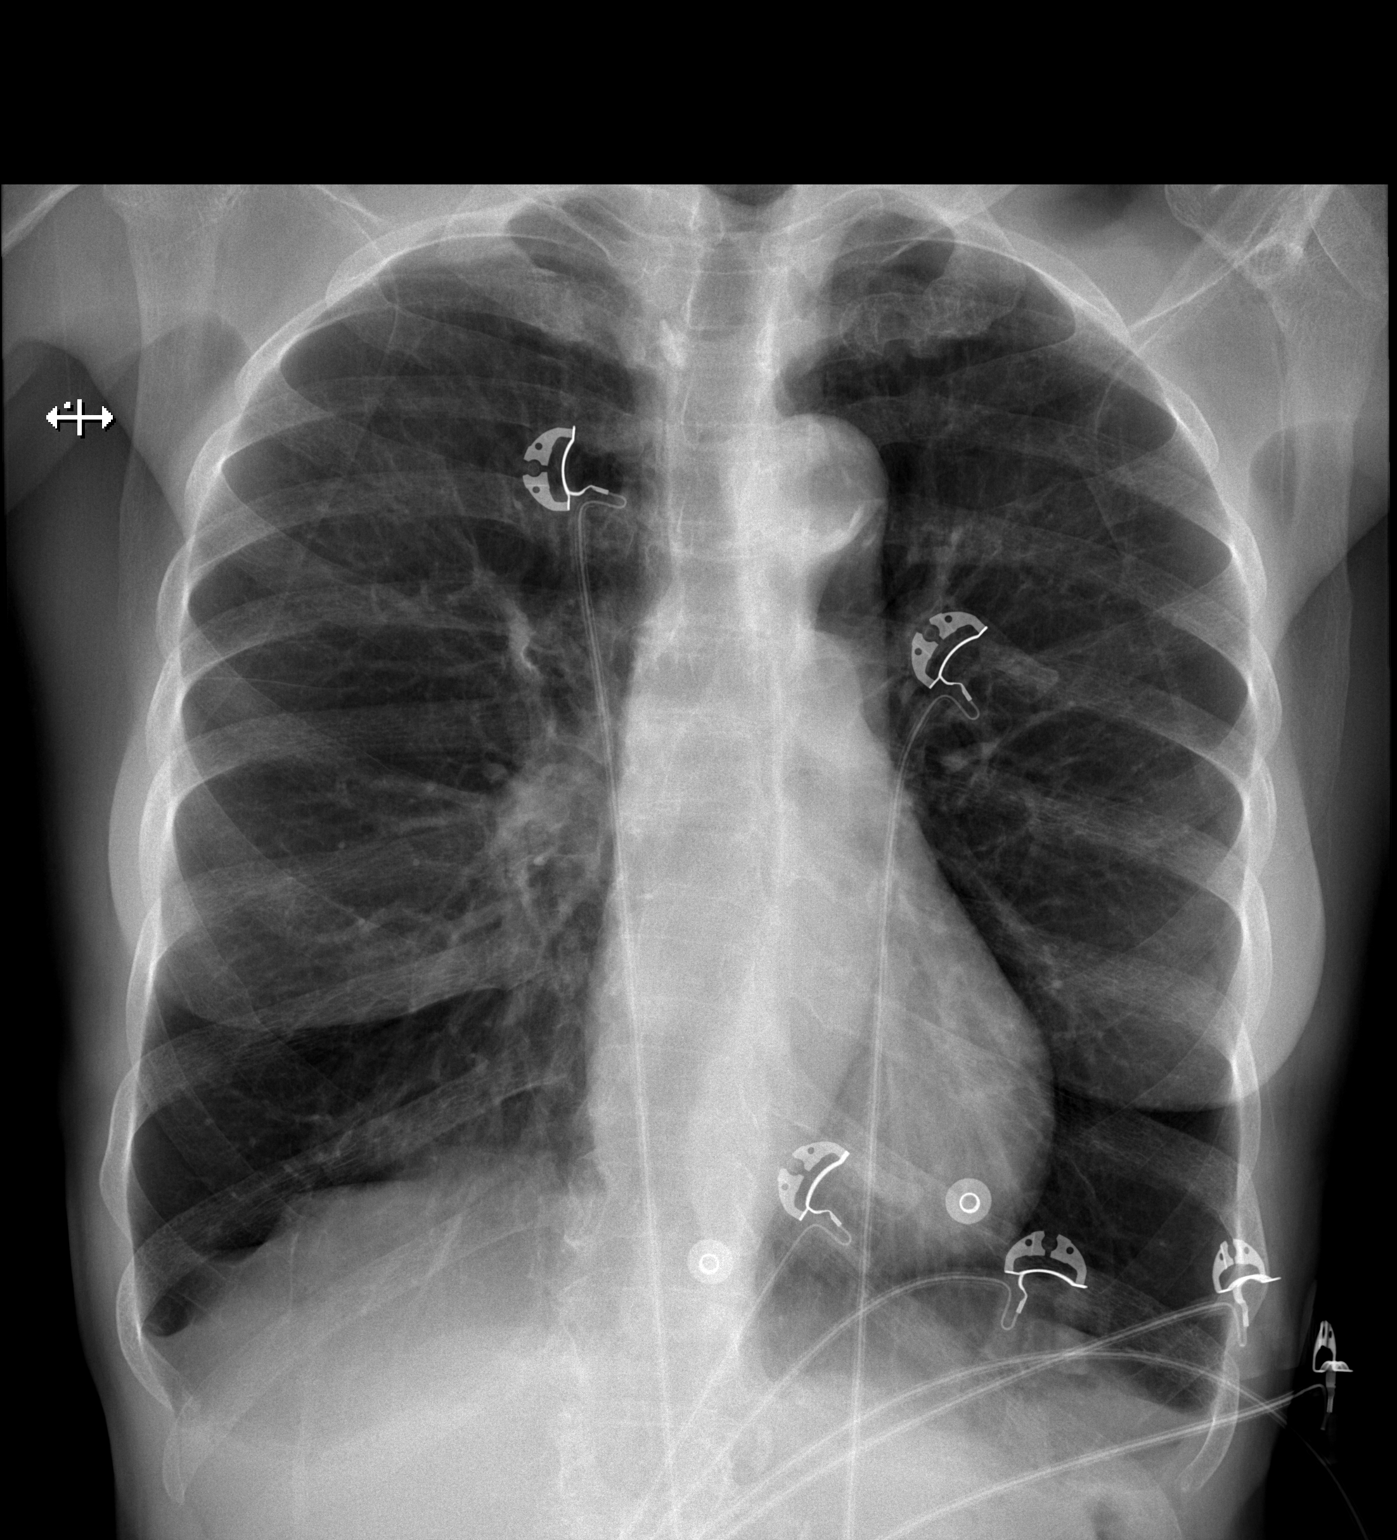
[im 2/2]
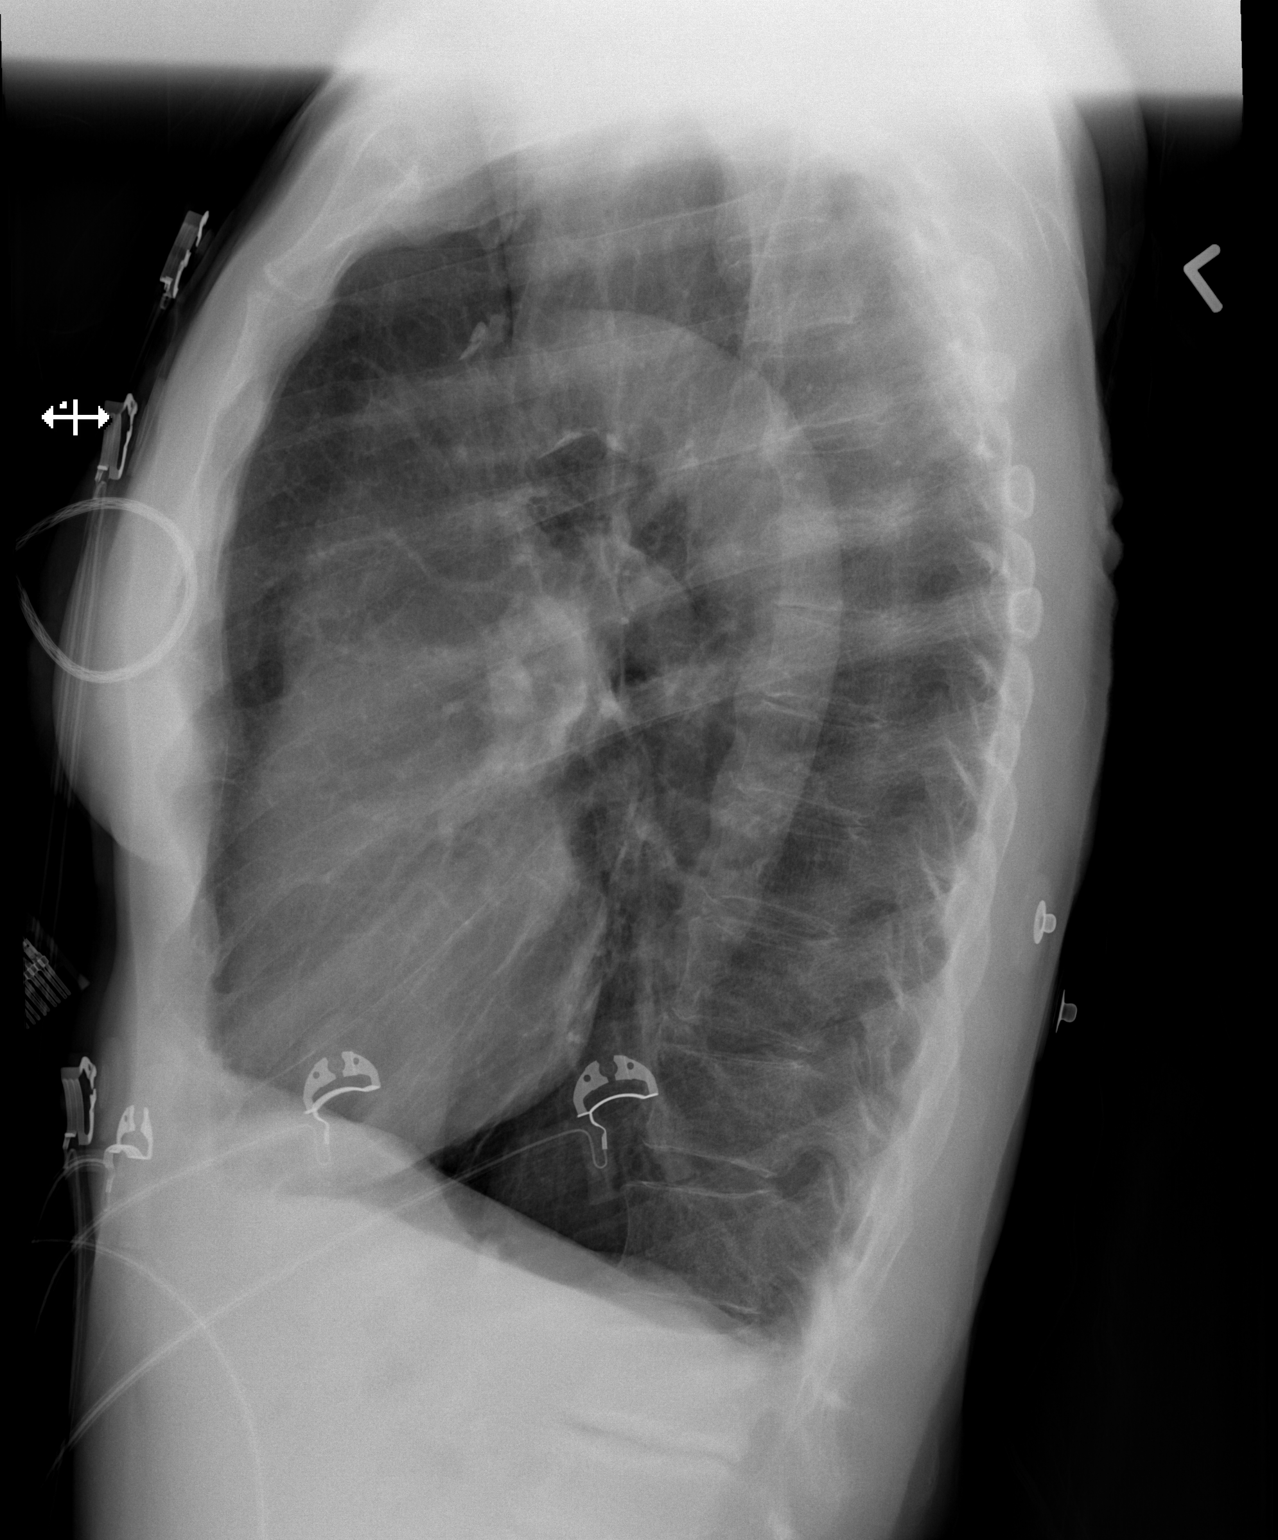

[2 of 2 positions shown; findings below may reference images not displayed]

FINDINGS: Heart size and mediastinal contours are normal. Atherosclerotic
changes noted at the aortic arch.

Lungs are hyperexpanded. Lungs are clear. No pleural effusion or
pneumothorax seen. No acute or suspicious osseous finding. At least
mild degenerative change noted within the upper lumbar spine,
incompletely imaged.
IMPRESSION: 1. No active cardiopulmonary disease. No evidence of pneumonia or
pulmonary edema.
2. Hyperexpanded lungs indicating COPD/emphysema.
3. Aortic atherosclerosis.

## 2018-02-18 ENCOUNTER — Ambulatory Visit
Admission: RE | Admit: 2018-02-18 | Discharge: 2018-02-18 | Disposition: A | Discharge status: Home / Self Care | Payer: Medicare Other | Source: Ambulatory Visit | Attending: Internal Medicine | Admitting: Internal Medicine

## 2018-02-18 ENCOUNTER — Ambulatory Visit (INDEPENDENT_AMBULATORY_CARE_PROVIDER_SITE_OTHER): Payer: Medicare Other | Admitting: Internal Medicine

## 2018-02-18 ENCOUNTER — Encounter: Payer: Self-pay | Admitting: Internal Medicine

## 2018-02-18 VITALS — BP 126/82 | HR 64 | Resp 16 | Ht <= 58 in | Wt 81.4 lb

## 2018-02-18 DIAGNOSIS — R7309 Other abnormal glucose: Secondary | ICD-10-CM

## 2018-02-18 DIAGNOSIS — E039 Hypothyroidism, unspecified: Secondary | ICD-10-CM

## 2018-02-18 DIAGNOSIS — R05 Cough: Secondary | ICD-10-CM | POA: Diagnosis not present

## 2018-02-18 DIAGNOSIS — R300 Dysuria: Secondary | ICD-10-CM | POA: Diagnosis not present

## 2018-02-18 DIAGNOSIS — Z0001 Encounter for general adult medical examination with abnormal findings: Secondary | ICD-10-CM

## 2018-02-18 DIAGNOSIS — J440 Chronic obstructive pulmonary disease with acute lower respiratory infection: Secondary | ICD-10-CM | POA: Diagnosis not present

## 2018-02-18 DIAGNOSIS — J449 Chronic obstructive pulmonary disease, unspecified: Secondary | ICD-10-CM | POA: Diagnosis not present

## 2018-02-18 DIAGNOSIS — J209 Acute bronchitis, unspecified: Secondary | ICD-10-CM | POA: Insufficient documentation

## 2018-02-18 DIAGNOSIS — E782 Mixed hyperlipidemia: Secondary | ICD-10-CM

## 2018-02-18 DIAGNOSIS — I10 Essential (primary) hypertension: Secondary | ICD-10-CM | POA: Diagnosis not present

## 2018-02-18 DIAGNOSIS — I700 Atherosclerosis of aorta: Secondary | ICD-10-CM | POA: Diagnosis not present

## 2018-02-18 MED ORDER — DESIPRAMINE HCL 25 MG PO TABS: 25.0000 mg | ORAL_TABLET | Freq: Every day | ORAL | 3 refills | Status: DC

## 2018-02-18 MED ORDER — IPRATROPIUM-ALBUTEROL 0.5-2.5 (3) MG/3ML IN SOLN
3.0000 mL | Freq: Once | RESPIRATORY_TRACT | Status: AC
  Administered 2018-02-18: 3 mL via RESPIRATORY_TRACT

## 2018-02-18 MED ORDER — AZITHROMYCIN 250 MG PO TABS: ORAL_TABLET | ORAL | 0 refills | Status: DC

## 2018-02-18 MED ORDER — IPRATROPIUM-ALBUTEROL 0.5-2.5 (3) MG/3ML IN SOLN: 3.0000 mL | Freq: Once | RESPIRATORY_TRACT | Status: DC

## 2018-02-18 NOTE — Progress Notes (Signed)
[facility] [place], [place] [address]  Internal MEDICINE  Office Visit Note  Patient Name: Pam Cruz  [id number]  [id number]  Date of Service: 02/18/2018  Chief Complaint  Patient presents with  . Annual Exam    medicare physical    HPI Pt is here for routine health maintenance examination. She has few complaints today 1. Cough and wheezing, just got her advair.  2. Feeling depressed since could not afford desipramine 3. Hip pain getting worse but cannot afford surgery   Surgical History: Past Surgical History:  Procedure Laterality Date  . COLONOSCOPY    . LAPAROSCOPY     Medical History: Past Medical History:  Diagnosis Date  . Arthritis   . COPD (chronic obstructive pulmonary disease) ([place])   . Gastritis   . GERD (gastroesophageal reflux disease)   . Hypertension   . Osteoporosis     Family History: Family History  Problem Relation Age of Onset  . Hypertension Mother   . Coronary artery disease Father   . Heart disease Father    Review of Systems  Constitutional: Negative for chills, diaphoresis and fatigue.  HENT: Negative for ear pain, postnasal drip and sinus pressure.   Eyes: Negative for photophobia, discharge, redness, itching and visual disturbance.  Respiratory: Negative for cough, shortness of breath and wheezing.   Cardiovascular: Negative for chest pain, palpitations and leg swelling.  Gastrointestinal: Negative for abdominal pain, constipation, diarrhea, nausea and vomiting.       Heartburn   Genitourinary: Negative for dysuria and flank pain.  Musculoskeletal: Negative for arthralgias, back pain, gait problem and neck pain.  Skin: Negative for color change.  Allergic/Immunologic: Negative for environmental allergies and food allergies.  Neurological: Negative for dizziness and headaches.  Hematological: Does not bruise/bleed easily.  Psychiatric/Behavioral: Negative for agitation, behavioral problems  (depression) and hallucinations.   Vital Signs: BP 126/82   Pulse 64   Resp 16   Ht 4\' 7"  (1.397 m)   Wt 81 lb 6.4 oz (36.9 kg)   SpO2 97%   BMI 18.92 kg/m   Physical Exam  Constitutional: She is oriented to person, place, and time. She appears well-developed and well-nourished.  HENT:  Head: Normocephalic and atraumatic.  Right Ear: External ear normal.  Left Ear: External ear normal.  Nose: Nose normal.  Mouth/Throat: Oropharynx is clear and moist. No oropharyngeal exudate.  Eyes: Pupils are equal, round, and reactive to light. EOM are normal.  Neck: Normal range of motion. Neck supple. No JVD present. No tracheal deviation present. No thyromegaly present.  Cardiovascular: Normal rate, regular rhythm and normal heart sounds. Exam reveals no gallop and no friction rub.  No murmur heard. Pulmonary/Chest: Effort normal. No respiratory distress. She has wheezes. She has no rales. She exhibits no tenderness. Right breast exhibits no mass. Left breast exhibits no mass. No breast tenderness.  Abdominal: Soft. Bowel sounds are normal. There is no tenderness.  Musculoskeletal: She exhibits tenderness and deformity.  Pt has a limp in her gait   Lymphadenopathy:    She has no cervical adenopathy.  Neurological: She is alert and oriented to person, place, and time. No cranial nerve deficit.  Skin: Skin is warm and dry. She is not diaphoretic.  Psychiatric: Her behavior is normal. Judgment and thought content normal.  depressed   Assessment/Plan: 1. Encounter for general adult medical examination with abnormal findings - Updated , pt was instructed to restart her desipramine for abdominal pain and depression  2. Acute bronchitis with COPD ([place]) - ipratropium-albuterol (DUONEB) 0.5-2.5 (3) MG/3ML nebulizer solution 3 mL - CBC with Differential/Platelet - DG Chest 2 View; Future  3. Mixed hyperlipidemia - Lipid Panel With LDL/HDL Ratio  4. Essential hypertension, benign - Continue  all meds, controlled  - Comprehensive metabolic panel  5. Abnormal glucose - Hemoglobin A1c  6. Hypothyroidism, unspecified type - TSH - T4, free  7. Dysuria - Urinalysis, Routine w reflex microscopic  General Counseling: deloras reichard understanding of the findings of todays visit and agrees with plan of treatment. I have discussed any further diagnostic evaluation that may be needed or ordered today. We also reviewed her medications today. she has been encouraged to call the office with any questions or concerns that should arise related to todays visit.  Orders Placed This Encounter  Procedures  . DG Chest 2 View  . Urinalysis, Routine w reflex microscopic  . CBC with Differential/Platelet  . Lipid Panel With LDL/HDL Ratio  . TSH  . T4, free  . Comprehensive metabolic panel    Meds ordered this encounter  Medications  . ipratropium-albuterol (DUONEB) 0.5-2.5 (3) MG/3ML nebulizer solution 3 mL  . DISCONTD: ipratropium-albuterol (DUONEB) 0.5-2.5 (3) MG/3ML nebulizer solution 3 mL    Time spent:25  Minutes   Lavera Guise, MD  Internal Medicine

## 2018-02-19 LAB — URINALYSIS, ROUTINE W REFLEX MICROSCOPIC
BILIRUBIN UA: NEGATIVE
Glucose, UA: NEGATIVE
Ketones, UA: NEGATIVE
Leukocytes, UA: NEGATIVE
NITRITE UA: NEGATIVE
PH UA: 5.5 (ref 5.0–7.5)
PROTEIN UA: NEGATIVE
RBC UA: NEGATIVE
Specific Gravity, UA: 1.007 (ref 1.005–1.030)
UUROB: 0.2 mg/dL (ref 0.2–1.0)

## 2018-02-20 ENCOUNTER — Encounter: Payer: Self-pay | Admitting: Internal Medicine

## 2018-02-20 DIAGNOSIS — E782 Mixed hyperlipidemia: Secondary | ICD-10-CM | POA: Diagnosis not present

## 2018-02-20 DIAGNOSIS — J209 Acute bronchitis, unspecified: Secondary | ICD-10-CM | POA: Diagnosis not present

## 2018-02-20 DIAGNOSIS — I10 Essential (primary) hypertension: Secondary | ICD-10-CM | POA: Diagnosis not present

## 2018-02-20 DIAGNOSIS — J440 Chronic obstructive pulmonary disease with acute lower respiratory infection: Secondary | ICD-10-CM | POA: Diagnosis not present

## 2018-02-20 DIAGNOSIS — E039 Hypothyroidism, unspecified: Secondary | ICD-10-CM | POA: Diagnosis not present

## 2018-02-21 LAB — CBC WITH DIFFERENTIAL/PLATELET
BASOS ABS: 0 10*3/uL (ref 0.0–0.2)
Basos: 1 %
EOS (ABSOLUTE): 0 10*3/uL (ref 0.0–0.4)
EOS: 1 %
Hematocrit: 36.8 % (ref 34.0–46.6)
Hemoglobin: 12.5 g/dL (ref 11.1–15.9)
IMMATURE GRANULOCYTES: 1 %
Immature Grans (Abs): 0 10*3/uL (ref 0.0–0.1)
Lymphocytes Absolute: 1 10*3/uL (ref 0.7–3.1)
Lymphs: 28 %
MCH: 29.8 pg (ref 26.6–33.0)
MCHC: 34 g/dL (ref 31.5–35.7)
MCV: 88 fL (ref 79–97)
MONOS ABS: 0.2 10*3/uL (ref 0.1–0.9)
Monocytes: 6 %
NEUTROS PCT: 63 %
Neutrophils Absolute: 2.3 10*3/uL (ref 1.4–7.0)
PLATELETS: 286 10*3/uL (ref 150–379)
RBC: 4.19 x10E6/uL (ref 3.77–5.28)
RDW: 14.7 % (ref 12.3–15.4)
WBC: 3.6 10*3/uL (ref 3.4–10.8)

## 2018-02-21 LAB — COMPREHENSIVE METABOLIC PANEL
ALBUMIN: 4.4 g/dL (ref 3.5–4.7)
ALK PHOS: 121 IU/L — AB (ref 39–117)
ALT: 24 IU/L (ref 0–32)
AST: 29 IU/L (ref 0–40)
Albumin/Globulin Ratio: 2.1 (ref 1.2–2.2)
BUN / CREAT RATIO: 13 (ref 12–28)
BUN: 9 mg/dL (ref 8–27)
Bilirubin Total: 0.5 mg/dL (ref 0.0–1.2)
CO2: 23 mmol/L (ref 20–29)
CREATININE: 0.69 mg/dL (ref 0.57–1.00)
Calcium: 10.1 mg/dL (ref 8.7–10.3)
Chloride: 104 mmol/L (ref 96–106)
GFR calc Af Amer: 94 mL/min/{1.73_m2} (ref >59)
GFR, EST NON AFRICAN AMERICAN: 82 mL/min/{1.73_m2} (ref >59)
GLOBULIN, TOTAL: 2.1 g/dL (ref 1.5–4.5)
GLUCOSE: 84 mg/dL (ref 65–99)
Potassium: 4.5 mmol/L (ref 3.5–5.2)
SODIUM: 142 mmol/L (ref 134–144)
Total Protein: 6.5 g/dL (ref 6.0–8.5)

## 2018-02-21 LAB — LIPID PANEL WITH LDL/HDL RATIO
CHOLESTEROL TOTAL: 164 mg/dL (ref 100–199)
HDL: 71 mg/dL (ref >39)
LDL Calculated: 86 mg/dL (ref 0–99)
LDl/HDL Ratio: 1.2 ratio (ref 0.0–3.2)
TRIGLYCERIDES: 36 mg/dL (ref 0–149)
VLDL Cholesterol Cal: 7 mg/dL (ref 5–40)

## 2018-02-21 LAB — T4, FREE: Free T4: 1.23 ng/dL (ref 0.82–1.77)

## 2018-02-21 LAB — TSH: TSH: 4.04 u[IU]/mL (ref 0.450–4.500)

## 2018-02-23 ENCOUNTER — Other Ambulatory Visit: Payer: Self-pay | Admitting: Internal Medicine

## 2018-03-07 ENCOUNTER — Other Ambulatory Visit: Payer: Self-pay | Admitting: Internal Medicine

## 2018-03-13 DIAGNOSIS — H40053 Ocular hypertension, bilateral: Secondary | ICD-10-CM | POA: Diagnosis not present

## 2018-04-29 ENCOUNTER — Other Ambulatory Visit: Payer: Self-pay | Admitting: Internal Medicine

## 2018-06-24 ENCOUNTER — Ambulatory Visit (INDEPENDENT_AMBULATORY_CARE_PROVIDER_SITE_OTHER): Payer: Medicare Other | Admitting: Internal Medicine

## 2018-06-24 ENCOUNTER — Other Ambulatory Visit: Payer: Self-pay

## 2018-06-24 ENCOUNTER — Encounter: Payer: Self-pay | Admitting: Internal Medicine

## 2018-06-24 DIAGNOSIS — J449 Chronic obstructive pulmonary disease, unspecified: Secondary | ICD-10-CM | POA: Diagnosis not present

## 2018-06-24 DIAGNOSIS — F411 Generalized anxiety disorder: Secondary | ICD-10-CM

## 2018-06-24 DIAGNOSIS — I10 Essential (primary) hypertension: Secondary | ICD-10-CM

## 2018-06-24 DIAGNOSIS — M159 Polyosteoarthritis, unspecified: Secondary | ICD-10-CM

## 2018-06-24 MED ORDER — PNEUMOCOCCAL VAC POLYVALENT 25 MCG/0.5ML IJ INJ: 0.5000 mL | INJECTION | INTRAMUSCULAR | 0 refills | Status: AC

## 2018-06-24 NOTE — Progress Notes (Signed)
[facility] [place], [place] [address]  Internal MEDICINE  Office Visit Note  Patient Name: Pam Cruz  [id number]  [id number]  Date of Service: 06/24/2018  Chief Complaint  Patient presents with  . Hypertension  . Gastroesophageal Reflux  . Arthritis   Hypertension  This is a chronic problem. The current episode started more than 1 year ago. The problem has been waxing and waning since onset. The problem is controlled. Associated symptoms include anxiety. Pertinent negatives include no chest pain, headaches, neck pain, palpitations or shortness of breath. Compliance problems include exercise.   Gastroesophageal Reflux  She complains of heartburn. She reports no abdominal pain, no chest pain, no coughing, no nausea or no wheezing. This is a chronic problem. The current episode started more than 1 year ago. Heartburn duration: She still has to watch her diet. Pertinent negatives include no fatigue.  Arthritis  Presents for follow-up visit. She complains of joint swelling. The symptoms have been worsening. Affected locations include the right hip. Pertinent negatives include no diarrhea, dysuria or fatigue. Compliance problems include medication cost.     Current Medication: Outpatient Encounter Medications as of 06/24/2018  Medication Sig  . acetaminophen (TYLENOL) 500 MG tablet Take 500 mg by mouth every 6 (six) hours as needed.  Marland Kitchen amLODipine (NORVASC) 5 MG tablet TAKE 1 AND 1/2 TABLETS BY  MOUTH EVERY DAY FOR  HYPERTENSION  . clonazePAM (KLONOPIN) 0.5 MG tablet TAKE 1 TABLET BY MOUTH  EVERY DAY AS NEEDED, MAY  TAKE AN ADDITIONAL 1/2  TABLET AT NIGHT IF NEEDED  . desipramine (NORPRAMIN) 25 MG tablet Take 1 tablet (25 mg total) by mouth daily.  . fluticasone (FLONASE) 50 MCG/ACT nasal spray USE 2 SPRAYS IN EACH  NOSTRIL EVERY NIGHT AT  BEDTIME  . fluticasone (FLONASE) 50 MCG/ACT nasal spray USE 2 SPRAYS IN EACH  NOSTRIL EVERY NIGHT AT  BEDTIME  .  hydrALAZINE (APRESOLINE) 50 MG tablet TAKE 1/2 TABLET BY MOUTH  THREE TIMES A DAY  . levothyroxine (SYNTHROID, LEVOTHROID) 50 MCG tablet TAKE ONE-HALF TABLET BY  MOUTH ONCE A DAY ON AN  EMPTY STOMACH  . losartan (COZAAR) 100 MG tablet TAKE 1 TABLET BY MOUTH  DAILY  . montelukast (SINGULAIR) 10 MG tablet TAKE 1 TABLET BY MOUTH  EVERY DAY  . Multiple Vitamins-Minerals (PRESERVISION AREDS 2+MULTI VIT) CAPS Take by mouth.  Marland Kitchen omeprazole (PRILOSEC) 20 MG capsule Take 20 mg by mouth daily.  Orlie Dakin Sodium (SENNA-DOCUSATE SODIUM PO) Take by mouth.  Grant Ruts INHUB 250-50 MCG/DOSE AEPB USE 1 PUFF TWICE DAILY  . [DISCONTINUED] azithromycin (ZITHROMAX) 250 MG tablet Take one tab a day for 7 days (Patient not taking: Reported on 06/24/2018)   No facility-administered encounter medications on file as of 06/24/2018.     Surgical History: Past Surgical History:  Procedure Laterality Date  . COLONOSCOPY    . LAPAROSCOPY      Medical History: Past Medical History:  Diagnosis Date  . Arthritis   . COPD (chronic obstructive pulmonary disease) ([place])   . Gastritis   . GERD (gastroesophageal reflux disease)   . Hypertension   . Osteoporosis     Family History: Family History  Problem Relation Age of Onset  . Hypertension Mother   . Coronary artery disease Father   . Heart disease Father     Social History   Socioeconomic History  . Marital status: Single    Spouse name: Not on file  . Number of  children: Not on file  . Years of education: Not on file  . Highest education level: Not on file  Occupational History  . Not on file  Social Needs  . Financial resource strain: Not on file  . Food insecurity:    Worry: Not on file    Inability: Not on file  . Transportation needs:    Medical: Not on file    Non-medical: Not on file  Tobacco Use  . Smoking status: Former Smoker    Last attempt to quit: 02/20/2001    Years since quitting: 17.3  . Smokeless tobacco: Never Used  .  Tobacco comment: quit 17 years ago  Substance and Sexual Activity  . Alcohol use: No  . Drug use: Not on file  . Sexual activity: Not on file  Lifestyle  . Physical activity:    Days per week: Not on file    Minutes per session: Not on file  . Stress: Not on file  Relationships  . Social connections:    Talks on phone: Not on file    Gets together: Not on file    Attends religious service: Not on file    Active member of club or organization: Not on file    Attends meetings of clubs or organizations: Not on file    Relationship status: Not on file  . Intimate partner violence:    Fear of current or ex partner: Not on file    Emotionally abused: Not on file    Physically abused: Not on file    Forced sexual activity: Not on file  Other Topics Concern  . Not on file  Social History Narrative  . Not on file   Review of Systems  Constitutional: Negative for chills, diaphoresis and fatigue.  HENT: Negative for ear pain, postnasal drip and sinus pressure.   Eyes: Negative for photophobia, discharge, redness, itching and visual disturbance.  Respiratory: Negative for cough, shortness of breath and wheezing.   Cardiovascular: Negative for chest pain, palpitations and leg swelling.  Gastrointestinal: Positive for heartburn. Negative for abdominal pain, constipation, diarrhea, nausea and vomiting.  Genitourinary: Negative for dysuria and flank pain.  Musculoskeletal: Positive for arthritis and joint swelling. Negative for arthralgias, back pain, gait problem and neck pain.  Skin: Negative for color change.  Allergic/Immunologic: Negative for environmental allergies and food allergies.  Neurological: Negative for dizziness and headaches.  Hematological: Does not bruise/bleed easily.  Psychiatric/Behavioral: Negative for agitation, behavioral problems (depression) and hallucinations.    Vital Signs: BP 140/80 (BP Location: Left Arm, Patient Position: Sitting, Cuff Size: Small)    Pulse 65   Resp 16   Ht 4\' 7"  (1.397 m)   Wt 80 lb 3.2 oz (36.4 kg)   SpO2 97%   BMI 18.64 kg/m    Physical Exam  Constitutional: She is oriented to person, place, and time. She appears well-developed and well-nourished. No distress.  HENT:  Head: Normocephalic and atraumatic.  Mouth/Throat: Oropharynx is clear and moist. No oropharyngeal exudate.  Eyes: Pupils are equal, round, and reactive to light. EOM are normal.  Neck: Normal range of motion. Neck supple. No JVD present. No tracheal deviation present. No thyromegaly present.  Cardiovascular: Normal rate, regular rhythm and normal heart sounds. Exam reveals no gallop and no friction rub.  No murmur heard. Pulmonary/Chest: Effort normal. No respiratory distress. She has no wheezes. She has no rales. She exhibits no tenderness.  Abdominal: Soft. Bowel sounds are normal.  Musculoskeletal: Normal range of  motion.  Lymphadenopathy:    She has no cervical adenopathy.  Neurological: She is alert and oriented to person, place, and time. No cranial nerve deficit.  Skin: Skin is warm and dry. She is not diaphoretic.  Psychiatric: She has a normal mood and affect. Her behavior is normal. Judgment and thought content normal.   Assessment/Plan: 1. Essential hypertension, benign Stable  2. Chronic obstructive pulmonary disease, unspecified COPD type ([place]) Improved   3. Generalized anxiety disorder Continue on klonopin  4. Generalized osteoarthritis of multiple sites Pt will like to see ortho for hip replacement   General Counseling: scherry laverne understanding of the findings of todays visit and agrees with plan of treatment. I have discussed any further diagnostic evaluation that may be needed or ordered today. We also reviewed her medications today. she has been encouraged to call the office with any questions or concerns that should arise related to todays visit.  Time spent:25 Minutes  Dr Lavera Guise Internal medicine

## 2018-07-11 DIAGNOSIS — M1611 Unilateral primary osteoarthritis, right hip: Secondary | ICD-10-CM | POA: Diagnosis not present

## 2018-07-11 DIAGNOSIS — M25551 Pain in right hip: Secondary | ICD-10-CM | POA: Diagnosis not present

## 2018-07-21 ENCOUNTER — Telehealth: Payer: Self-pay | Admitting: Internal Medicine

## 2018-07-23 ENCOUNTER — Other Ambulatory Visit: Payer: Self-pay | Admitting: Internal Medicine

## 2018-07-23 MED ORDER — CLONAZEPAM 0.5 MG PO TABS: ORAL_TABLET | ORAL | 0 refills | Status: DC

## 2018-07-24 NOTE — Telephone Encounter (Signed)
Med refill request

## 2018-09-08 ENCOUNTER — Other Ambulatory Visit: Payer: Self-pay | Admitting: Internal Medicine

## 2018-09-24 DIAGNOSIS — H40053 Ocular hypertension, bilateral: Secondary | ICD-10-CM | POA: Diagnosis not present

## 2018-09-25 ENCOUNTER — Encounter: Payer: Self-pay | Admitting: Adult Health

## 2018-09-25 ENCOUNTER — Ambulatory Visit (INDEPENDENT_AMBULATORY_CARE_PROVIDER_SITE_OTHER): Payer: Medicare Other | Admitting: Adult Health

## 2018-09-25 VITALS — BP 141/80 | HR 78 | Temp 95.6°F | Resp 16 | Ht <= 58 in | Wt 78.4 lb

## 2018-09-25 DIAGNOSIS — I10 Essential (primary) hypertension: Secondary | ICD-10-CM

## 2018-09-25 DIAGNOSIS — J449 Chronic obstructive pulmonary disease, unspecified: Secondary | ICD-10-CM | POA: Diagnosis not present

## 2018-09-25 DIAGNOSIS — J069 Acute upper respiratory infection, unspecified: Secondary | ICD-10-CM

## 2018-09-25 MED ORDER — AZITHROMYCIN 250 MG PO TABS: ORAL_TABLET | ORAL | 0 refills | Status: DC

## 2018-09-25 NOTE — Progress Notes (Signed)
[facility] [place], [place] [address]  Internal MEDICINE  Office Visit Note  Patient Name: Pam Cruz  [id number]  [id number]  Date of Service: 09/26/2018  Chief Complaint  Patient presents with  . Cough  . Sinusitis  . Hypertension     HPI Pt is here for a sick visit. She is reporting sore throat, sinus pain/pressure with productive cough.  She reports this is going on for almost a week.  She is worried that her sinus drainage has caused a lung infection.  She denies fever but reports some chills intermittently.     Current Medication:  Outpatient Encounter Medications as of 09/25/2018  Medication Sig  . acetaminophen (TYLENOL) 500 MG tablet Take 500 mg by mouth every 6 (six) hours as needed.  Marland Kitchen amLODipine (NORVASC) 5 MG tablet TAKE 1 AND 1/2 TABLETS BY  MOUTH EVERY DAY FOR  HYPERTENSION  . clonazePAM (KLONOPIN) 0.5 MG tablet TAKE 1 TABLET BY MOUTH  EVERY DAY AS NEEDED, MAY  TAKE AN ADDITIONAL 1/2  TABLET AT NIGHT IF NEEDED  . desipramine (NORPRAMIN) 25 MG tablet Take 1 tablet (25 mg total) by mouth daily.  . fluticasone (FLONASE) 50 MCG/ACT nasal spray USE 2 SPRAYS IN EACH  NOSTRIL EVERY NIGHT AT  BEDTIME  . fluticasone (FLONASE) 50 MCG/ACT nasal spray USE 2 SPRAYS IN EACH  NOSTRIL EVERY NIGHT AT  BEDTIME  . hydrALAZINE (APRESOLINE) 50 MG tablet TAKE 1/2 TABLET BY MOUTH  THREE TIMES A DAY  . levothyroxine (SYNTHROID, LEVOTHROID) 50 MCG tablet TAKE ONE-HALF TABLET BY  MOUTH ONCE A DAY ON AN  EMPTY STOMACH  . losartan (COZAAR) 100 MG tablet TAKE 1 TABLET BY MOUTH  DAILY  . montelukast (SINGULAIR) 10 MG tablet TAKE 1 TABLET BY MOUTH  EVERY DAY  . Multiple Vitamins-Minerals (PRESERVISION AREDS 2+MULTI VIT) CAPS Take by mouth.  Marland Kitchen omeprazole (PRILOSEC) 20 MG capsule Take 20 mg by mouth daily.  Orlie Dakin Sodium (SENNA-DOCUSATE SODIUM PO) Take by mouth.  Grant Ruts INHUB 250-50 MCG/DOSE AEPB USE 1 PUFF TWICE DAILY  . azithromycin  (ZITHROMAX) 250 MG tablet Take as directed.   No facility-administered encounter medications on file as of 09/25/2018.       Medical History: Past Medical History:  Diagnosis Date  . Arthritis   . COPD (chronic obstructive pulmonary disease) ([place])   . Gastritis   . GERD (gastroesophageal reflux disease)   . Hypertension   . Osteoporosis      Vital Signs: BP (!) 141/80   Pulse 78   Temp (!) 95.6 F (35.3 C)   Resp 16   Ht 4\' 7"  (1.397 m)   Wt 78 lb 6.4 oz (35.6 kg)   SpO2 97%   BMI 18.22 kg/m    Review of Systems  Constitutional: Negative for chills, fatigue and unexpected weight change.  HENT: Positive for postnasal drip, sinus pressure and sinus pain. Negative for congestion, rhinorrhea, sneezing and sore throat.   Eyes: Negative for photophobia, pain and redness.  Respiratory: Positive for cough. Negative for chest tightness and shortness of breath.        Chest congestion  Cardiovascular: Negative for chest pain and palpitations.  Gastrointestinal: Negative for abdominal pain, constipation, diarrhea, nausea and vomiting.  Endocrine: Negative.   Genitourinary: Negative for dysuria and frequency.  Musculoskeletal: Negative for arthralgias, back pain, joint swelling and neck pain.  Skin: Negative for rash.  Allergic/Immunologic: Negative.   Neurological: Negative for tremors and numbness.  Hematological: Negative for adenopathy. Does not bruise/bleed easily.  Psychiatric/Behavioral: Negative for behavioral problems and sleep disturbance. The patient is not nervous/anxious.     Physical Exam Vitals signs and nursing note reviewed.  Constitutional:      General: She is not in acute distress.    Appearance: She is well-developed. She is not diaphoretic.  HENT:     Head: Normocephalic and atraumatic.     Mouth/Throat:     Pharynx: No oropharyngeal exudate.  Eyes:     Pupils: Pupils are equal, round, and reactive to light.  Neck:     Musculoskeletal: Normal  range of motion and neck supple.     Thyroid: No thyromegaly.     Vascular: No JVD.     Trachea: No tracheal deviation.  Cardiovascular:     Rate and Rhythm: Normal rate and regular rhythm.     Heart sounds: Normal heart sounds. No murmur. No friction rub. No gallop.   Pulmonary:     Effort: Pulmonary effort is normal. No respiratory distress.     Breath sounds: Normal breath sounds. No wheezing or rales.  Chest:     Chest wall: No tenderness.  Abdominal:     Palpations: Abdomen is soft.     Tenderness: There is no abdominal tenderness. There is no guarding.  Musculoskeletal: Normal range of motion.  Lymphadenopathy:     Cervical: No cervical adenopathy.  Skin:    General: Skin is warm and dry.  Neurological:     Mental Status: She is alert and oriented to person, place, and time.     Cranial Nerves: No cranial nerve deficit.  Psychiatric:        Behavior: Behavior normal.        Thought Content: Thought content normal.        Judgment: Judgment normal.    Assessment/Plan: 1. Upper respiratory tract infection, unspecified type Course of azithromycin ordered for patient.  She is instructed to return to clinic if symptoms fail to improve in 7 to 10 days. - azithromycin (ZITHROMAX) 250 MG tablet; Take as directed.  Dispense: 6 tablet; Refill: 0  2. Essential hypertension, benign Patient's pressure is borderline elevated today 141/80.  We will continue to follow in the future.  Patient is encouraged continue to take her medications as prescribed  3. Chronic obstructive pulmonary disease, unspecified COPD type ([place]) Stable patient has been using inhalers as prescribed.  General Counseling: avaleen brownley understanding of the findings of todays visit and agrees with plan of treatment. I have discussed any further diagnostic evaluation that may be needed or ordered today. We also reviewed her medications today. she has been encouraged to call the office with any questions or  concerns that should arise related to todays visit.   No orders of the defined types were placed in this encounter.   Meds ordered this encounter  Medications  . azithromycin (ZITHROMAX) 250 MG tablet    Sig: Take as directed.    Dispense:  6 tablet    Refill:  0    Time spent: 25 Minutes  This patient was seen by Orson Gear AGNP-C in Collaboration with Dr Lavera Guise as a part of collaborative care agreement.  Kendell Bane AGNP-C Internal Medicine

## 2018-09-26 ENCOUNTER — Other Ambulatory Visit: Payer: Self-pay | Admitting: Internal Medicine

## 2018-09-29 ENCOUNTER — Other Ambulatory Visit: Payer: Self-pay

## 2018-09-29 MED ORDER — LEVOTHYROXINE SODIUM 50 MCG PO TABS: ORAL_TABLET | ORAL | 3 refills | Status: DC

## 2018-09-29 NOTE — Telephone Encounter (Signed)
Last here 09/25/18

## 2018-10-06 ENCOUNTER — Encounter: Payer: Self-pay | Admitting: Adult Health

## 2018-10-06 ENCOUNTER — Ambulatory Visit (INDEPENDENT_AMBULATORY_CARE_PROVIDER_SITE_OTHER): Payer: Medicare Other | Admitting: Adult Health

## 2018-10-06 VITALS — BP 172/81 | HR 62 | Temp 95.9°F | Resp 16 | Ht <= 58 in | Wt 79.6 lb

## 2018-10-06 DIAGNOSIS — J449 Chronic obstructive pulmonary disease, unspecified: Secondary | ICD-10-CM | POA: Diagnosis not present

## 2018-10-06 DIAGNOSIS — K219 Gastro-esophageal reflux disease without esophagitis: Secondary | ICD-10-CM | POA: Diagnosis not present

## 2018-10-06 DIAGNOSIS — I10 Essential (primary) hypertension: Secondary | ICD-10-CM | POA: Diagnosis not present

## 2018-10-06 DIAGNOSIS — R233 Spontaneous ecchymoses: Secondary | ICD-10-CM

## 2018-10-06 NOTE — Patient Instructions (Signed)

## 2018-10-06 NOTE — Progress Notes (Signed)
[facility] [place], [place] [address]  Internal MEDICINE  Office Visit Note  Patient Name: Pam Cruz  [id number]  [id number]  Date of Service: 10/06/2018  Chief Complaint  Patient presents with  . Edema    LEFT & RIGHT LEG W/ RED SPOTS / ONSET LEFT LEG     HPI Pt is here for a sick visit.  Patient is here reporting that her bilateral lower extremities have had some swelling for the last week.  She also noticed a small red flat rash on the medial aspect of her right calf as well as the lateral aspect of her left calf.  She reports she noticed this a few days ago it has not gotten any bigger or smaller.  She denies any fever or other symptoms.  This rash does not itch and is flat and not raised.  Patient denies any trauma.  She states she has not fallen or scraped her legs and that she generally has been doing well.     Current Medication:  Outpatient Encounter Medications as of 10/06/2018  Medication Sig  . acetaminophen (TYLENOL) 500 MG tablet Take 500 mg by mouth every 6 (six) hours as needed.  Marland Kitchen amLODipine (NORVASC) 5 MG tablet TAKE 1 AND 1/2 TABLETS BY  MOUTH EVERY DAY FOR  HYPERTENSION  . azithromycin (ZITHROMAX) 250 MG tablet Take as directed.  . clonazePAM (KLONOPIN) 0.5 MG tablet TAKE 1 TABLET BY MOUTH  DAILY AS NEEDED - MAY TAKE  AN ADDITIONAL 1/2 TABLET AT NIGHT IF NEEDED  . desipramine (NORPRAMIN) 25 MG tablet Take 1 tablet (25 mg total) by mouth daily.  . fluticasone (FLONASE) 50 MCG/ACT nasal spray USE 2 SPRAYS IN EACH  NOSTRIL EVERY NIGHT AT  BEDTIME  . fluticasone (FLONASE) 50 MCG/ACT nasal spray USE 2 SPRAYS IN EACH  NOSTRIL EVERY NIGHT AT  BEDTIME  . hydrALAZINE (APRESOLINE) 50 MG tablet TAKE 1/2 TABLET BY MOUTH  THREE TIMES A DAY  . levothyroxine (SYNTHROID, LEVOTHROID) 50 MCG tablet TAKE ONE-HALF TABLET BY  MOUTH ONCE A DAY ON AN  EMPTY STOMACH  . losartan (COZAAR) 100 MG tablet TAKE 1 TABLET BY MOUTH  DAILY  . montelukast  (SINGULAIR) 10 MG tablet TAKE 1 TABLET BY MOUTH  EVERY DAY  . Multiple Vitamins-Minerals (PRESERVISION AREDS 2+MULTI VIT) CAPS Take by mouth.  Marland Kitchen omeprazole (PRILOSEC) 20 MG capsule Take 20 mg by mouth daily.  Orlie Dakin Sodium (SENNA-DOCUSATE SODIUM PO) Take by mouth.  Grant Ruts INHUB 250-50 MCG/DOSE AEPB USE 1 PUFF TWICE DAILY   No facility-administered encounter medications on file as of 10/06/2018.       Medical History: Past Medical History:  Diagnosis Date  . Arthritis   . COPD (chronic obstructive pulmonary disease) ([place])   . Gastritis   . GERD (gastroesophageal reflux disease)   . Hypertension   . Osteoporosis      Vital Signs: BP (!) 172/81   Pulse 62   Temp (!) 95.9 F (35.5 C)   Resp 16   Ht 4\' 7"  (1.397 m)   Wt 79 lb 9.6 oz (36.1 kg)   SpO2 97%   BMI 18.50 kg/m    Review of Systems  Constitutional: Negative for chills, fatigue and unexpected weight change.  HENT: Negative for congestion, rhinorrhea, sneezing and sore throat.   Eyes: Negative for photophobia, pain and redness.  Respiratory: Negative for cough, chest tightness and shortness of breath.   Cardiovascular: Negative for chest pain and  palpitations.  Gastrointestinal: Negative for abdominal pain, constipation, diarrhea, nausea and vomiting.  Endocrine: Negative.   Genitourinary: Negative for dysuria and frequency.  Musculoskeletal: Negative for arthralgias, back pain, joint swelling and neck pain.  Skin: Negative for rash.       Red spots on legs  Allergic/Immunologic: Negative.   Neurological: Negative for tremors and numbness.  Hematological: Negative for adenopathy. Does not bruise/bleed easily.  Psychiatric/Behavioral: Negative for behavioral problems and sleep disturbance. The patient is not nervous/anxious.     Physical Exam Vitals signs and nursing note reviewed.  Constitutional:      General: She is not in acute distress.    Appearance: She is well-developed. She is not  diaphoretic.  HENT:     Head: Normocephalic and atraumatic.     Mouth/Throat:     Pharynx: No oropharyngeal exudate.  Eyes:     Pupils: Pupils are equal, round, and reactive to light.  Neck:     Musculoskeletal: Normal range of motion and neck supple.     Thyroid: No thyromegaly.     Vascular: No JVD.     Trachea: No tracheal deviation.  Cardiovascular:     Rate and Rhythm: Normal rate and regular rhythm.     Heart sounds: Normal heart sounds. No murmur. No friction rub. No gallop.   Pulmonary:     Effort: Pulmonary effort is normal. No respiratory distress.     Breath sounds: Normal breath sounds. No wheezing or rales.  Chest:     Chest wall: No tenderness.  Abdominal:     Palpations: Abdomen is soft.     Tenderness: There is no abdominal tenderness. There is no guarding.  Musculoskeletal: Normal range of motion.  Lymphadenopathy:     Cervical: No cervical adenopathy.  Skin:    General: Skin is warm and dry.     Findings: Rash present.     Comments: Petechiae noted to medial aspect of right calf as well as lateral aspect of left calf.  Neurological:     Mental Status: She is alert and oriented to person, place, and time.     Cranial Nerves: No cranial nerve deficit.  Psychiatric:        Behavior: Behavior normal.        Thought Content: Thought content normal.        Judgment: Judgment normal.    Assessment/Plan: 1. Petechiae Will check patient's platelet count with CBC.  Patient is instructed to return to clinic if her petechiae do not resolve in 1 to 2 weeks. - CBC w/Diff/Platelet  2. Essential hypertension Patient blood pressure slightly elevated at today's visit 172/81.  Patient reports she did not her blood pressure medications yet today.  3. Chronic obstructive pulmonary disease, unspecified COPD type ([place]) Stable, continue current medications as prescribed  4. Gastroesophageal reflux disease without esophagitis Stable, continue current medication  therapy.  General Counseling: Pam Cruz understanding of the findings of todays visit and agrees with plan of treatment. I have discussed any further diagnostic evaluation that may be needed or ordered today. We also reviewed her medications today. she has been encouraged to call the office with any questions or concerns that should arise related to todays visit.   No orders of the defined types were placed in this encounter.   No orders of the defined types were placed in this encounter.   Time spent: 25  Minutes  This patient was seen by Orson Gear AGNP-C in Collaboration with Dr Timoteo Gaul  Humphrey Rolls as a part of collaborative care agreement.  Kendell Bane AGNP-C Internal Medicine

## 2018-10-07 LAB — CBC WITH DIFFERENTIAL/PLATELET
Basophils Absolute: 0 10*3/uL (ref 0.0–0.2)
Basos: 1 %
EOS (ABSOLUTE): 0 10*3/uL (ref 0.0–0.4)
Eos: 1 %
HEMATOCRIT: 37.5 % (ref 34.0–46.6)
HEMOGLOBIN: 12.8 g/dL (ref 11.1–15.9)
Immature Grans (Abs): 0 10*3/uL (ref 0.0–0.1)
Immature Granulocytes: 1 %
Lymphocytes Absolute: 1.3 10*3/uL (ref 0.7–3.1)
Lymphs: 26 %
MCH: 30.8 pg (ref 26.6–33.0)
MCHC: 34.1 g/dL (ref 31.5–35.7)
MCV: 90 fL (ref 79–97)
Monocytes Absolute: 0.5 10*3/uL (ref 0.1–0.9)
Monocytes: 10 %
Neutrophils Absolute: 3.1 10*3/uL (ref 1.4–7.0)
Neutrophils: 61 %
Platelets: 313 10*3/uL (ref 150–450)
RBC: 4.15 x10E6/uL (ref 3.77–5.28)
RDW: 12.3 % (ref 12.3–15.4)
WBC: 5 10*3/uL (ref 3.4–10.8)

## 2018-10-09 ENCOUNTER — Telehealth: Payer: Self-pay

## 2018-10-09 NOTE — Telephone Encounter (Signed)
Left message on voicemail informing pt of blood work results and if she needed [place] to give [place] a call.

## 2018-10-09 NOTE — Telephone Encounter (Signed)
-----   Message from Kendell Bane, NP sent at 10/09/2018 11:43 AM EST ----- Regarding: rash Please call patient and tell her her blood work came back normal.  Ask if the red spots on her legs have spread.  It will likely take a week or two for them to go away.  Ask her to let us know if they get worse or she develops any new symptoms.

## 2018-10-22 ENCOUNTER — Ambulatory Visit: Payer: Self-pay | Admitting: Adult Health

## 2018-10-28 ENCOUNTER — Ambulatory Visit: Payer: Self-pay | Admitting: Internal Medicine

## 2018-10-28 DIAGNOSIS — M1611 Unilateral primary osteoarthritis, right hip: Secondary | ICD-10-CM | POA: Diagnosis not present

## 2018-11-05 DIAGNOSIS — Z8719 Personal history of other diseases of the digestive system: Secondary | ICD-10-CM | POA: Diagnosis not present

## 2018-11-10 ENCOUNTER — Encounter: Payer: Self-pay | Admitting: Adult Health

## 2018-11-10 ENCOUNTER — Telehealth: Payer: Self-pay | Admitting: Adult Health

## 2018-11-10 ENCOUNTER — Ambulatory Visit (INDEPENDENT_AMBULATORY_CARE_PROVIDER_SITE_OTHER): Payer: Medicare Other | Admitting: Adult Health

## 2018-11-10 ENCOUNTER — Ambulatory Visit: Payer: Self-pay | Admitting: Adult Health

## 2018-11-10 VITALS — BP 150/82 | HR 71 | Resp 16 | Ht <= 58 in | Wt 79.0 lb

## 2018-11-10 DIAGNOSIS — I10 Essential (primary) hypertension: Secondary | ICD-10-CM | POA: Diagnosis not present

## 2018-11-10 DIAGNOSIS — E782 Mixed hyperlipidemia: Secondary | ICD-10-CM

## 2018-11-10 DIAGNOSIS — B351 Tinea unguium: Secondary | ICD-10-CM

## 2018-11-10 DIAGNOSIS — F411 Generalized anxiety disorder: Secondary | ICD-10-CM

## 2018-11-10 DIAGNOSIS — K219 Gastro-esophageal reflux disease without esophagitis: Secondary | ICD-10-CM | POA: Diagnosis not present

## 2018-11-10 DIAGNOSIS — M159 Polyosteoarthritis, unspecified: Secondary | ICD-10-CM

## 2018-11-10 DIAGNOSIS — J449 Chronic obstructive pulmonary disease, unspecified: Secondary | ICD-10-CM

## 2018-11-10 MED ORDER — TERBINAFINE HCL 125 MG PO PACK: 250.0000 mg | PACK | Freq: Every day | ORAL | 1 refills | Status: DC

## 2018-11-10 NOTE — Telephone Encounter (Signed)
kaitlinn from [facility] in [place] , rx for terbinafine would like to switch to 250mg  tablets for one a day is that ok

## 2018-11-10 NOTE — Progress Notes (Signed)
[place], [place] [address]  Internal MEDICINE  Office Visit Note  Patient Name: Pam Cruz  [id number]  [id number]  Date of Service: 11/16/2018  Chief Complaint  Patient presents with  . Hypertension  . Gastroesophageal Reflux  . Osteoarthritis  . Edema    still have some , swells everyday in the leg   . Quality Metric Gaps    pneumonia vaccinations     HPI Pt is here for follow up on HTN, GERD, osteoarthritis, and edema.  Her pressure is slightly elevated at this visit.  She denies any chest pain, shortness of breath, headache or any other issues.  Her GERD is currently well controlled.  She also reports that she continues to have some pain from osteoarthritis however this time she is able to control it.  She does report some very mild edema in her lower legs.  She reports that she notices this with the elastic of her socks seems to make an indention in her skin.  She is also in need of a pneumonia vaccine to close her quality metric gaps.    Current Medication: Outpatient Encounter Medications as of 11/10/2018  Medication Sig  . acetaminophen (TYLENOL) 500 MG tablet Take 500 mg by mouth every 6 (six) hours as needed.  Marland Kitchen amLODipine (NORVASC) 5 MG tablet TAKE 1 AND 1/2 TABLETS BY  MOUTH EVERY DAY FOR  HYPERTENSION  . clonazePAM (KLONOPIN) 0.5 MG tablet TAKE 1 TABLET BY MOUTH  DAILY AS NEEDED - MAY TAKE  AN ADDITIONAL 1/2 TABLET AT NIGHT IF NEEDED  . desipramine (NORPRAMIN) 25 MG tablet Take 1 tablet (25 mg total) by mouth daily.  . fluticasone (FLONASE) 50 MCG/ACT nasal spray USE 2 SPRAYS IN EACH  NOSTRIL EVERY NIGHT AT  BEDTIME  . fluticasone (FLONASE) 50 MCG/ACT nasal spray USE 2 SPRAYS IN EACH  NOSTRIL EVERY NIGHT AT  BEDTIME  . hydrALAZINE (APRESOLINE) 50 MG tablet TAKE 1/2 TABLET BY MOUTH  THREE TIMES A DAY  . levothyroxine (SYNTHROID, LEVOTHROID) 50 MCG tablet TAKE ONE-HALF TABLET BY  MOUTH ONCE A DAY ON AN  EMPTY STOMACH  . losartan  (COZAAR) 100 MG tablet TAKE 1 TABLET BY MOUTH  DAILY  . montelukast (SINGULAIR) 10 MG tablet TAKE 1 TABLET BY MOUTH  EVERY DAY  . Multiple Vitamins-Minerals (PRESERVISION AREDS 2+MULTI VIT) CAPS Take by mouth.  Marland Kitchen omeprazole (PRILOSEC) 20 MG capsule Take 20 mg by mouth daily.  Orlie Dakin Sodium (SENNA-DOCUSATE SODIUM PO) Take by mouth.  Grant Ruts INHUB 250-50 MCG/DOSE AEPB USE 1 PUFF TWICE DAILY  . [DISCONTINUED] azithromycin (ZITHROMAX) 250 MG tablet Take as directed. (Patient not taking: Reported on 11/10/2018)  . [DISCONTINUED] Terbinafine HCl 125 MG PACK Take 250 mg by mouth daily.   No facility-administered encounter medications on file as of 11/10/2018.     Surgical History: Past Surgical History:  Procedure Laterality Date  . COLONOSCOPY    . LAPAROSCOPY      Medical History: Past Medical History:  Diagnosis Date  . Arthritis   . COPD (chronic obstructive pulmonary disease) ([place])   . Gastritis   . GERD (gastroesophageal reflux disease)   . Hypertension   . Osteoporosis     Family History: Family History  Problem Relation Age of Onset  . Hypertension Mother   . Coronary artery disease Father   . Heart disease Father     Social History   Socioeconomic History  . Marital status: Single  Spouse name: Not on file  . Number of children: Not on file  . Years of education: Not on file  . Highest education level: Not on file  Occupational History  . Not on file  Social Needs  . Financial resource strain: Not on file  . Food insecurity:    Worry: Not on file    Inability: Not on file  . Transportation needs:    Medical: Not on file    Non-medical: Not on file  Tobacco Use  . Smoking status: Former Smoker    Last attempt to quit: 02/20/2001    Years since quitting: 17.7  . Smokeless tobacco: Never Used  . Tobacco comment: quit 17 years ago  Substance and Sexual Activity  . Alcohol use: Yes    Comment: very rarely  . Drug use: Never  . Sexual  activity: Not on file  Lifestyle  . Physical activity:    Days per week: Not on file    Minutes per session: Not on file  . Stress: Not on file  Relationships  . Social connections:    Talks on phone: Not on file    Gets together: Not on file    Attends religious service: Not on file    Active member of club or organization: Not on file    Attends meetings of clubs or organizations: Not on file    Relationship status: Not on file  . Intimate partner violence:    Fear of current or ex partner: Not on file    Emotionally abused: Not on file    Physically abused: Not on file    Forced sexual activity: Not on file  Other Topics Concern  . Not on file  Social History Narrative  . Not on file      Review of Systems  Constitutional: Negative for chills, fatigue and unexpected weight change.  HENT: Negative for congestion, rhinorrhea, sneezing and sore throat.   Eyes: Negative for photophobia, pain and redness.  Respiratory: Negative for cough, chest tightness and shortness of breath.   Cardiovascular: Negative for chest pain and palpitations.  Gastrointestinal: Negative for abdominal pain, constipation, diarrhea, nausea and vomiting.  Endocrine: Negative.   Genitourinary: Negative for dysuria and frequency.  Musculoskeletal: Negative for arthralgias, back pain, joint swelling and neck pain.  Skin: Negative for rash.  Allergic/Immunologic: Negative.   Neurological: Negative for tremors and numbness.  Hematological: Negative for adenopathy. Does not bruise/bleed easily.  Psychiatric/Behavioral: Negative for behavioral problems and sleep disturbance. The patient is not nervous/anxious.     Vital Signs: BP (!) 150/82   Pulse 71   Resp 16   Ht 4\' 7"  (1.397 m)   Wt 79 lb (35.8 kg)   SpO2 99%   BMI 18.36 kg/m    Physical Exam Vitals signs and nursing note reviewed.  Constitutional:      General: She is not in acute distress.    Appearance: She is well-developed. She is  not diaphoretic.  HENT:     Head: Normocephalic and atraumatic.     Mouth/Throat:     Pharynx: No oropharyngeal exudate.  Eyes:     Pupils: Pupils are equal, round, and reactive to light.  Neck:     Musculoskeletal: Normal range of motion and neck supple.     Thyroid: No thyromegaly.     Vascular: No JVD.     Trachea: No tracheal deviation.  Cardiovascular:     Rate and Rhythm: Normal rate and regular rhythm.  Heart sounds: Normal heart sounds. No murmur. No friction rub. No gallop.   Pulmonary:     Effort: Pulmonary effort is normal. No respiratory distress.     Breath sounds: Normal breath sounds. No wheezing or rales.  Chest:     Chest wall: No tenderness.  Abdominal:     Palpations: Abdomen is soft.     Tenderness: There is no abdominal tenderness. There is no guarding.  Musculoskeletal: Normal range of motion.  Lymphadenopathy:     Cervical: No cervical adenopathy.  Skin:    General: Skin is warm and dry.  Neurological:     Mental Status: She is alert and oriented to person, place, and time.     Cranial Nerves: No cranial nerve deficit.  Psychiatric:        Behavior: Behavior normal.        Thought Content: Thought content normal.        Judgment: Judgment normal.    Assessment/Plan: 1. Essential hypertension, benign Slightly elevated today.  We will continue to monitor at future visit.  2. Gastroesophageal reflux disease without esophagitis Stable, continue current medications  3. Chronic obstructive pulmonary disease, unspecified COPD type ([place]) Stable, encourage patient to continue using medications as prescribed.  Also ordered pneumococcal vaccine at this time. - Pneumococcal conjugate vaccine 13-valent  4. Mixed hyperlipidemia Stable, recheck lipid panel at next labs.  5. Generalized anxiety disorder Stable, patient denies any issues with anxiety depression at this time.  6. Generalized osteoarthritis of multiple sites Stable, not currently  requiring any aggressive pain medications.  7. Toenail fungus Terbinafine explained to patient.  Ordered for toenail fungus of her bilateral feet. - Terbinafine HCl 125 MG PACK; Take 250 mg by mouth daily.  Dispense: 60 each; Refill: 1  General Counseling: twisha vanpelt understanding of the findings of todays visit and agrees with plan of treatment. I have discussed any further diagnostic evaluation that may be needed or ordered today. We also reviewed her medications today. she has been encouraged to call the office with any questions or concerns that should arise related to todays visit.    Orders Placed This Encounter  Procedures  . Pneumococcal conjugate vaccine 13-valent    Meds ordered this encounter  Medications  . DISCONTD: Terbinafine HCl 125 MG PACK    Sig: Take 250 mg by mouth daily.    Dispense:  60 each    Refill:  1    Time spent: 25 Minutes   This patient was seen by Orson Gear AGNP-C in Collaboration with Dr Lavera Guise as a part of collaborative care agreement     Kendell Bane AGNP-C Internal medicine

## 2018-11-10 NOTE — Patient Instructions (Signed)

## 2018-11-11 MED ORDER — TERBINAFINE HCL 250 MG PO TABS: 250.0000 mg | ORAL_TABLET | Freq: Every day | ORAL | 0 refills | Status: DC

## 2018-11-11 NOTE — Telephone Encounter (Signed)
Per Quita Skye ok to do 250 mg for once a day, pharmacy called and notified

## 2018-11-12 ENCOUNTER — Telehealth: Payer: Self-pay | Admitting: Adult Health

## 2018-11-12 ENCOUNTER — Other Ambulatory Visit: Payer: Self-pay | Admitting: Adult Health

## 2018-11-12 NOTE — Telephone Encounter (Signed)
error 

## 2018-11-12 NOTE — Telephone Encounter (Signed)
Spoke to pharmacy in regards to terbinafine and pneumonia vaccination, patient needs the 23 and oral lamisil has been confirmed for 250 tab once daily

## 2018-11-13 ENCOUNTER — Telehealth: Payer: Self-pay

## 2018-11-13 NOTE — Telephone Encounter (Signed)
Mom to call us back

## 2018-11-14 ENCOUNTER — Telehealth: Payer: Self-pay | Admitting: Internal Medicine

## 2018-11-14 NOTE — Telephone Encounter (Signed)
SPOKE PT ABOUT LOSARTAN SHE SAID SHE NEED TO CONITNUE 100 MG 1/2 TAB DAILY UNTIL HER APPT AND SHE HAD PLENTY LEFT FOR KNOW

## 2018-11-19 ENCOUNTER — Telehealth: Payer: Self-pay

## 2018-11-19 MED ORDER — LOSARTAN POTASSIUM 50 MG PO TABS: 50.0000 mg | ORAL_TABLET | Freq: Every day | ORAL | 1 refills | Status: DC

## 2018-11-19 NOTE — Telephone Encounter (Signed)
As per dr Humphrey Rolls change losartan 50 mg once daily and end pres to phar

## 2018-11-19 NOTE — Telephone Encounter (Signed)
Spoke with [facility] pharmacy care about that we going to change losartan 100 to losartan 50 mg because pt was taking losartan 100 half a day

## 2018-11-19 NOTE — Telephone Encounter (Signed)
lmom that we change losartan 50mg  once daily

## 2018-11-27 ENCOUNTER — Other Ambulatory Visit: Payer: Self-pay | Admitting: Internal Medicine

## 2019-01-06 DIAGNOSIS — Z1212 Encounter for screening for malignant neoplasm of rectum: Secondary | ICD-10-CM | POA: Diagnosis not present

## 2019-01-06 DIAGNOSIS — Z1211 Encounter for screening for malignant neoplasm of colon: Secondary | ICD-10-CM | POA: Diagnosis not present

## 2019-01-29 ENCOUNTER — Other Ambulatory Visit: Payer: Self-pay | Admitting: Adult Health

## 2019-02-24 ENCOUNTER — Ambulatory Visit: Payer: Self-pay | Admitting: Internal Medicine

## 2019-03-03 ENCOUNTER — Ambulatory Visit: Payer: Self-pay | Admitting: Internal Medicine

## 2019-03-10 ENCOUNTER — Other Ambulatory Visit: Payer: Self-pay

## 2019-03-10 ENCOUNTER — Ambulatory Visit (INDEPENDENT_AMBULATORY_CARE_PROVIDER_SITE_OTHER): Payer: Medicare Other | Admitting: Internal Medicine

## 2019-03-10 ENCOUNTER — Encounter: Payer: Self-pay | Admitting: Internal Medicine

## 2019-03-10 DIAGNOSIS — Z8719 Personal history of other diseases of the digestive system: Secondary | ICD-10-CM

## 2019-03-10 DIAGNOSIS — R300 Dysuria: Secondary | ICD-10-CM | POA: Diagnosis not present

## 2019-03-10 DIAGNOSIS — Z0001 Encounter for general adult medical examination with abnormal findings: Secondary | ICD-10-CM | POA: Diagnosis not present

## 2019-03-10 DIAGNOSIS — I10 Essential (primary) hypertension: Secondary | ICD-10-CM | POA: Diagnosis not present

## 2019-03-10 DIAGNOSIS — B351 Tinea unguium: Secondary | ICD-10-CM | POA: Diagnosis not present

## 2019-03-10 DIAGNOSIS — M159 Polyosteoarthritis, unspecified: Secondary | ICD-10-CM | POA: Diagnosis not present

## 2019-03-10 DIAGNOSIS — E039 Hypothyroidism, unspecified: Secondary | ICD-10-CM

## 2019-03-10 DIAGNOSIS — J449 Chronic obstructive pulmonary disease, unspecified: Secondary | ICD-10-CM

## 2019-03-10 DIAGNOSIS — E782 Mixed hyperlipidemia: Secondary | ICD-10-CM

## 2019-03-10 DIAGNOSIS — K219 Gastro-esophageal reflux disease without esophagitis: Secondary | ICD-10-CM

## 2019-03-10 NOTE — Progress Notes (Signed)
[facility] [place], [place] [address]  Internal MEDICINE  Office Visit Note  Patient Name: Pam Cruz  [id number]  [id number]  Date of Service: 03/12/2019  Chief Complaint  Patient presents with  . Annual Exam  . Gastroesophageal Reflux  . Hypertension     HPI Pt is here for routine health maintenance examination, she does not have any major complaints, she is staying home and wearing a mask while going out, did see her ortho and is thinking about having a hip repacement. She will like to see a podiatrist due to toe nail fungus   Current Medication: Outpatient Encounter Medications as of 03/10/2019  Medication Sig  . acetaminophen (TYLENOL) 500 MG tablet Take 500 mg by mouth every 6 (six) hours as needed.  Marland Kitchen amLODipine (NORVASC) 5 MG tablet TAKE 1 AND 1/2 TABLETS BY  MOUTH EVERY DAY FOR  HYPERTENSION  . clonazePAM (KLONOPIN) 0.5 MG tablet TAKE 1 TABLET BY MOUTH  DAILY AS NEEDED MAY TAKE AN ADDITIONAL ONE-HALF TABLET  AT NIGHT IF NEEDED  . desipramine (NORPRAMIN) 25 MG tablet Take 1 tablet (25 mg total) by mouth daily.  . fluticasone (FLONASE) 50 MCG/ACT nasal spray USE 2 SPRAYS IN EACH  NOSTRIL EVERY NIGHT AT  BEDTIME  . hydrALAZINE (APRESOLINE) 50 MG tablet TAKE 1/2 TABLET BY MOUTH  THREE TIMES A DAY  . levothyroxine (SYNTHROID, LEVOTHROID) 50 MCG tablet TAKE ONE-HALF TABLET BY  MOUTH ONCE A DAY ON AN  EMPTY STOMACH  . losartan (COZAAR) 50 MG tablet Take 1 tablet (50 mg total) by mouth daily.  . montelukast (SINGULAIR) 10 MG tablet TAKE 1 TABLET BY MOUTH  EVERY DAY  . Multiple Vitamins-Minerals (PRESERVISION AREDS 2+MULTI VIT) CAPS Take by mouth.  Marland Kitchen omeprazole (PRILOSEC) 20 MG capsule Take 20 mg by mouth daily.  Orlie Dakin Sodium (SENNA-DOCUSATE SODIUM PO) Take by mouth.  . terbinafine (LAMISIL) 250 MG tablet Take 1 tablet (250 mg total) by mouth daily.  Grant Ruts INHUB 250-50 MCG/DOSE AEPB USE 1 PUFF TWICE DAILY  . Pneumococcal Vac  Polyvalent (PNEUMOVAX 23 IJ) Inject as directed.  . [DISCONTINUED] fluticasone (FLONASE) 50 MCG/ACT nasal spray USE 2 SPRAYS IN EACH  NOSTRIL EVERY NIGHT AT  BEDTIME   No facility-administered encounter medications on file as of 03/10/2019.     Surgical History: Past Surgical History:  Procedure Laterality Date  . COLONOSCOPY    . LAPAROSCOPY      Medical History: Past Medical History:  Diagnosis Date  . Arthritis   . COPD (chronic obstructive pulmonary disease) ([place])   . Gastritis   . GERD (gastroesophageal reflux disease)   . Hypertension   . Osteoporosis     Family History: Family History  Problem Relation Age of Onset  . Hypertension Mother   . Coronary artery disease Father   . Heart disease Father       Review of Systems  Constitutional: Negative for chills, diaphoresis and fatigue.  HENT: Negative for ear pain, postnasal drip and sinus pressure.   Eyes: Negative for photophobia, discharge, redness, itching and visual disturbance.  Respiratory: Negative for cough, shortness of breath and wheezing.   Cardiovascular: Negative for chest pain, palpitations and leg swelling.  Gastrointestinal: Negative for abdominal pain, constipation, diarrhea, nausea and vomiting.  Genitourinary: Negative for dysuria and flank pain.  Musculoskeletal: Positive for arthralgias, gait problem and myalgias. Negative for back pain and neck pain.  Skin: Negative for color change.  Allergic/Immunologic: Negative for environmental allergies  and food allergies.  Neurological: Negative for dizziness and headaches.  Hematological: Does not bruise/bleed easily.  Psychiatric/Behavioral: Negative for agitation, behavioral problems (depression) and hallucinations.   Vital Signs: BP 131/73   Pulse 75   Resp 16   Ht 4\' 7"  (1.397 m)   Wt 78 lb 12.8 oz (35.7 kg)   SpO2 95%   BMI 18.31 kg/m   Physical Exam Constitutional:      General: She is not in acute distress.    Appearance: She is  well-developed. She is not diaphoretic.  HENT:     Head: Normocephalic and atraumatic.     Mouth/Throat:     Pharynx: No oropharyngeal exudate.  Eyes:     Pupils: Pupils are equal, round, and reactive to light.  Neck:     Musculoskeletal: Normal range of motion and neck supple.     Thyroid: No thyromegaly.     Vascular: No JVD.     Trachea: No tracheal deviation.  Cardiovascular:     Rate and Rhythm: Normal rate and regular rhythm.     Heart sounds: Normal heart sounds. No murmur. No friction rub. No gallop.   Pulmonary:     Effort: Pulmonary effort is normal. No respiratory distress.     Breath sounds: No wheezing or rales.  Chest:     Chest wall: No tenderness.  Abdominal:     General: Bowel sounds are normal.     Palpations: Abdomen is soft.  Musculoskeletal: Normal range of motion.  Lymphadenopathy:     Cervical: No cervical adenopathy.  Skin:    General: Skin is warm and dry.  Neurological:     Mental Status: She is alert and oriented to person, place, and time.     Cranial Nerves: No cranial nerve deficit.  Psychiatric:        Behavior: Behavior normal.        Thought Content: Thought content normal.        Judgment: Judgment normal.   patient chose not to have a breast exam   LABS: Recent Results (from the past 2160 hour(s))  Urinalysis, Routine w reflex microscopic     Status: None   Collection Time: 03/10/19 11:32 AM  Result Value Ref Range   Specific Gravity, UA 1.018 1.005 - 1.030   pH, UA 5.0 5.0 - 7.5   Color, UA Yellow Yellow   Appearance Ur Clear Clear   Leukocytes,UA Negative Negative   Protein,UA Negative Negative/Trace   Glucose, UA Negative Negative   Ketones, UA Negative Negative   RBC, UA Negative Negative   Bilirubin, UA Negative Negative   Urobilinogen, Ur 0.2 0.2 - 1.0 mg/dL   Nitrite, UA Negative Negative   Microscopic Examination Comment     Comment: Microscopic not indicated and not performed.   Assessment/Plan: 1. Encounter for  general adult medical examination with abnormal findings  - PHM is updated, labs ordered  2. Essential hypertension, benign  - Controlled 3. Toenail fungus  - Continue meds   - Ambulatory referral to Podiatry 4. Gastroesophageal reflux disease without esophagitis   - Controlled with meds  5. Generalized osteoarthritis of multiple sites - per ortho  - Comprehensive metabolic panel 6. History of IBS - Stable  7. Chronic obstructive pulmonary disease, unspecified COPD type ([place]) - Controlled with prn use of MDI  8. Mixed hyperlipidemia - Controlled  9. Dysuria - Urinalysis, Routine w reflex microscopic 10. Hypothyroidism, unspecified type - TSH; Future - T4, free; Future  General  Counseling: takira sherrin understanding of the findings of todays visit and agrees with plan of treatment. I have discussed any further diagnostic evaluation that may be needed or ordered today. We also reviewed her medications today. she has been encouraged to call the office with any questions or concerns that should arise related to todays visit.    Counseling: Cardiac risk factor modification:  1. Control blood pressure. 2. Exercise as prescribed. 3. Follow low sodium, low fat diet. and low fat and low cholestrol diet. 4. Take ASA 81mg  once a day. 5. Restricted calories diet to lose weight.   Orders Placed This Encounter  Procedures  . Urinalysis, Routine w reflex microscopic  . CBC with Differential/Platelet  . Lipid Panel With LDL/HDL Ratio  . TSH  . T4, free  . Comprehensive metabolic panel  . Ambulatory referral to Podiatry     Time spent:30 Minutes      Lavera Guise, MD  Internal Medicine

## 2019-03-11 LAB — URINALYSIS, ROUTINE W REFLEX MICROSCOPIC
Bilirubin, UA: NEGATIVE
Glucose, UA: NEGATIVE
Ketones, UA: NEGATIVE
Leukocytes,UA: NEGATIVE
Nitrite, UA: NEGATIVE
Protein,UA: NEGATIVE
RBC, UA: NEGATIVE
Specific Gravity, UA: 1.018 (ref 1.005–1.030)
Urobilinogen, Ur: 0.2 mg/dL (ref 0.2–1.0)
pH, UA: 5 (ref 5.0–7.5)

## 2019-03-19 ENCOUNTER — Other Ambulatory Visit: Payer: Self-pay | Admitting: Internal Medicine

## 2019-03-19 NOTE — Telephone Encounter (Signed)
lov annual exam on 03/10/19

## 2019-03-23 DIAGNOSIS — L603 Nail dystrophy: Secondary | ICD-10-CM | POA: Diagnosis not present

## 2019-03-23 DIAGNOSIS — B351 Tinea unguium: Secondary | ICD-10-CM | POA: Diagnosis not present

## 2019-03-23 MED ORDER — CLONAZEPAM 0.5 MG PO TABS: ORAL_TABLET | ORAL | 0 refills | Status: DC

## 2019-03-23 NOTE — Addendum Note (Signed)
Addended by: Lavera Guise on: 03/23/2019 01:50 PM   Modules accepted: Orders

## 2019-03-25 DIAGNOSIS — H40053 Ocular hypertension, bilateral: Secondary | ICD-10-CM | POA: Diagnosis not present

## 2019-04-13 ENCOUNTER — Other Ambulatory Visit: Payer: Self-pay

## 2019-04-13 MED ORDER — AMLODIPINE BESYLATE 5 MG PO TABS: ORAL_TABLET | ORAL | 1 refills | Status: DC

## 2019-04-20 DIAGNOSIS — M79675 Pain in left toe(s): Secondary | ICD-10-CM | POA: Diagnosis not present

## 2019-04-20 DIAGNOSIS — M79674 Pain in right toe(s): Secondary | ICD-10-CM | POA: Diagnosis not present

## 2019-04-20 DIAGNOSIS — L603 Nail dystrophy: Secondary | ICD-10-CM | POA: Diagnosis not present

## 2019-04-20 DIAGNOSIS — B351 Tinea unguium: Secondary | ICD-10-CM | POA: Diagnosis not present

## 2019-05-10 ENCOUNTER — Other Ambulatory Visit: Payer: Self-pay | Admitting: Adult Health

## 2019-05-11 ENCOUNTER — Other Ambulatory Visit: Payer: Self-pay

## 2019-05-11 NOTE — Telephone Encounter (Signed)
Pt called for refills she said her podiatrist told her to take for 3 month because you prescribe she want we can refills

## 2019-05-12 ENCOUNTER — Other Ambulatory Visit: Payer: Self-pay | Admitting: Adult Health

## 2019-05-31 ENCOUNTER — Other Ambulatory Visit: Payer: Self-pay | Admitting: Internal Medicine

## 2019-06-10 ENCOUNTER — Other Ambulatory Visit: Payer: Self-pay | Admitting: Internal Medicine

## 2019-06-30 DIAGNOSIS — M1611 Unilateral primary osteoarthritis, right hip: Secondary | ICD-10-CM | POA: Diagnosis not present

## 2019-07-07 ENCOUNTER — Ambulatory Visit: Payer: Medicare Other | Admitting: Internal Medicine

## 2019-07-14 ENCOUNTER — Encounter: Payer: Self-pay | Admitting: Internal Medicine

## 2019-07-14 ENCOUNTER — Other Ambulatory Visit: Payer: Self-pay

## 2019-07-14 ENCOUNTER — Ambulatory Visit (INDEPENDENT_AMBULATORY_CARE_PROVIDER_SITE_OTHER): Payer: Medicare Other | Admitting: Internal Medicine

## 2019-07-14 VITALS — BP 130/80 | HR 74 | Temp 97.7°F | Resp 16 | Ht <= 58 in | Wt 77.0 lb

## 2019-07-14 DIAGNOSIS — M159 Polyosteoarthritis, unspecified: Secondary | ICD-10-CM

## 2019-07-14 DIAGNOSIS — E039 Hypothyroidism, unspecified: Secondary | ICD-10-CM | POA: Diagnosis not present

## 2019-07-14 DIAGNOSIS — I10 Essential (primary) hypertension: Secondary | ICD-10-CM

## 2019-07-14 DIAGNOSIS — B351 Tinea unguium: Secondary | ICD-10-CM

## 2019-07-14 DIAGNOSIS — E782 Mixed hyperlipidemia: Secondary | ICD-10-CM | POA: Diagnosis not present

## 2019-07-14 DIAGNOSIS — K219 Gastro-esophageal reflux disease without esophagitis: Secondary | ICD-10-CM

## 2019-07-14 DIAGNOSIS — J449 Chronic obstructive pulmonary disease, unspecified: Secondary | ICD-10-CM

## 2019-07-14 DIAGNOSIS — Z79899 Other long term (current) drug therapy: Secondary | ICD-10-CM | POA: Diagnosis not present

## 2019-07-14 DIAGNOSIS — Z8719 Personal history of other diseases of the digestive system: Secondary | ICD-10-CM

## 2019-07-14 LAB — POCT URINE DRUG SCREEN
POC Amphetamine UR: NOT DETECTED
POC BENZODIAZEPINES UR: NOT DETECTED
POC Barbiturate UR: NOT DETECTED
POC Cocaine UR: NOT DETECTED
POC Ecstasy UR: NOT DETECTED
POC Marijuana UR: NOT DETECTED
POC Methadone UR: NOT DETECTED
POC Methamphetamine UR: NOT DETECTED
POC Opiate Ur: NOT DETECTED
POC Oxycodone UR: NOT DETECTED
POC PHENCYCLIDINE UR: NOT DETECTED
POC TRICYCLICS UR: POSITIVE — AB

## 2019-07-14 NOTE — Progress Notes (Signed)
[facility] [place], [place] [address]  Internal MEDICINE  Office Visit Note  Patient Name: Pam Cruz  [id number]  [id number]  Date of Service: 07/14/2019  Chief Complaint  Patient presents with  . Anxiety  . Hypertension  . Medication Problem    lamisil     HPI Pt is here for f/u. She c/o of slow healing of infected toe nail on left foot.  1)She denies any tingling or swelling in both feet. 2) She denies cough, SOB or chest pain. 3) She denies any vomiting, diarrhea, or abdominal pain. Patient states she continues to take all her medications as directed.  Current Medication: Outpatient Encounter Medications as of 07/14/2019  Medication Sig  . acetaminophen (TYLENOL) 500 MG tablet Take 500 mg by mouth every 6 (six) hours as needed.  Marland Kitchen amLODipine (NORVASC) 5 MG tablet TAKE 1 AND 1/2 TABLETS BY  MOUTH EVERY DAY FOR  HYPERTENSION  . clonazePAM (KLONOPIN) 0.5 MG tablet Take one tab po bid prn for anxiety  . desipramine (NORPRAMIN) 25 MG tablet TAKE 1 TABLET BY MOUTH  DAILY  . fluticasone (FLONASE) 50 MCG/ACT nasal spray USE 2 SPRAYS IN EACH  NOSTRIL EVERY NIGHT AT  BEDTIME  . hydrALAZINE (APRESOLINE) 50 MG tablet TAKE 1/2 TABLET BY MOUTH  THREE TIMES A DAY  . levothyroxine (SYNTHROID, LEVOTHROID) 50 MCG tablet TAKE ONE-HALF TABLET BY  MOUTH ONCE A DAY ON AN  EMPTY STOMACH  . losartan (COZAAR) 50 MG tablet TAKE 1 TABLET BY MOUTH  DAILY  . montelukast (SINGULAIR) 10 MG tablet TAKE 1 TABLET BY MOUTH  EVERY DAY  . Multiple Vitamins-Minerals (PRESERVISION AREDS 2+MULTI VIT) CAPS Take by mouth.  Marland Kitchen omeprazole (PRILOSEC) 20 MG capsule Take 20 mg by mouth daily.  . Pneumococcal Vac Polyvalent (PNEUMOVAX 23 IJ) Inject as directed.  Orlie Dakin Sodium (SENNA-DOCUSATE SODIUM PO) Take by mouth.  . terbinafine (LAMISIL) 1 % cream Apply 1 application topically 2 (two) times daily.  Grant Ruts INHUB 250-50 MCG/DOSE AEPB USE 1 PUFF TWICE DAILY  .  [DISCONTINUED] terbinafine (LAMISIL) 250 MG tablet Take 1 tablet (250 mg total) by mouth daily. (Patient not taking: Reported on 07/14/2019)   No facility-administered encounter medications on file as of 07/14/2019.     Surgical History: Past Surgical History:  Procedure Laterality Date  . COLONOSCOPY    . LAPAROSCOPY      Medical History: Past Medical History:  Diagnosis Date  . Arthritis   . COPD (chronic obstructive pulmonary disease) ([place])   . Gastritis   . GERD (gastroesophageal reflux disease)   . Hypertension   . Osteoporosis     Family History: Family History  Problem Relation Age of Onset  . Hypertension Mother   . Coronary artery disease Father   . Heart disease Father     Social History   Socioeconomic History  . Marital status: Single    Spouse name: Not on file  . Number of children: Not on file  . Years of education: Not on file  . Highest education level: Not on file  Occupational History  . Not on file  Social Needs  . Financial resource strain: Not on file  . Food insecurity    Worry: Not on file    Inability: Not on file  . Transportation needs    Medical: Not on file    Non-medical: Not on file  Tobacco Use  . Smoking status: Former Smoker    Quit date:  02/20/2001    Years since quitting: 18.4  . Smokeless tobacco: Never Used  . Tobacco comment: quit 17 years ago  Substance and Sexual Activity  . Alcohol use: Yes    Comment: very rarely  . Drug use: Never  . Sexual activity: Not on file  Lifestyle  . Physical activity    Days per week: Not on file    Minutes per session: Not on file  . Stress: Not on file  Relationships  . Social Herbalist on phone: Not on file    Gets together: Not on file    Attends religious service: Not on file    Active member of club or organization: Not on file    Attends meetings of clubs or organizations: Not on file    Relationship status: Not on file  . Intimate partner violence    Fear of  current or ex partner: Not on file    Emotionally abused: Not on file    Physically abused: Not on file    Forced sexual activity: Not on file  Other Topics Concern  . Not on file  Social History Narrative  . Not on file      Review of Systems  Constitutional: Negative for chills, diaphoresis and fatigue.  HENT: Negative for ear pain, postnasal drip and sinus pressure.   Eyes: Negative for photophobia, discharge, redness, itching and visual disturbance.  Respiratory: Negative for cough, shortness of breath and wheezing.   Cardiovascular: Negative for chest pain, palpitations and leg swelling.  Gastrointestinal: Negative for abdominal pain, constipation, diarrhea, nausea and vomiting.  Genitourinary: Negative for dysuria and flank pain.  Musculoskeletal: Negative for arthralgias, back pain, gait problem and neck pain.  Skin: Negative for color change.  Allergic/Immunologic: Negative for environmental allergies and food allergies.  Neurological: Negative for dizziness and headaches.  Hematological: Does not bruise/bleed easily.  Psychiatric/Behavioral: Negative for agitation, behavioral problems (depression) and hallucinations.    Vital Signs: BP 130/80   Pulse 74   Temp 97.7 F (36.5 C)   Resp 16   Ht 4\' 7"  (1.397 m)   Wt 77 lb (34.9 kg)   SpO2 96%   BMI 17.90 kg/m    Physical Exam Constitutional:      General: She is not in acute distress.    Appearance: She is well-developed. She is not diaphoretic.  HENT:     Head: Normocephalic and atraumatic.     Mouth/Throat:     Pharynx: No oropharyngeal exudate.  Eyes:     Pupils: Pupils are equal, round, and reactive to light.  Neck:     Musculoskeletal: Normal range of motion and neck supple.     Thyroid: No thyromegaly.     Vascular: No JVD.     Trachea: No tracheal deviation.  Cardiovascular:     Rate and Rhythm: Normal rate and regular rhythm.     Heart sounds: Normal heart sounds. No murmur. No friction rub. No  gallop.   Pulmonary:     Effort: Pulmonary effort is normal. No respiratory distress.     Breath sounds: No wheezing or rales.  Chest:     Chest wall: No tenderness.  Abdominal:     General: Bowel sounds are normal.     Palpations: Abdomen is soft.  Musculoskeletal: Normal range of motion.  Lymphadenopathy:     Cervical: No cervical adenopathy.  Skin:    General: Skin is warm and dry.  Neurological:  Mental Status: She is alert and oriented to person, place, and time.     Cranial Nerves: No cranial nerve deficit.  Psychiatric:        Behavior: Behavior normal.        Thought Content: Thought content normal.        Judgment: Judgment normal.    Assessment/Plan: 1. Essential hypertension, benign - Controlled with meds  2. Hypothyroidism, unspecified type - Continue Synthroid   3. Mixed hyperlipidemia Controlled with meds   4. Generalized osteoarthritis of multiple sites - pt is scheduled to have right hip replacements   5. History of IBS - Continue with GI   6. Encounter for long-term (current) use of medications - Continue anxiolytics as before  - POCT Urine Drug Screen  7. Chronic obstructive pulmonary disease, unspecified COPD type ([place]) - Stable, Pt gets samples of MDI  8. Gastroesophageal reflux disease without esophagitis - Controlled with meds  - CBC with Differential/Platelet  9. Toenail fungus - advised to keep cleaning with alcohol and apply Lamisil to infected toenail.  General Counseling: redina bonello understanding of the findings of todays visit and agrees with plan of treatment. I have discussed any further diagnostic evaluation that may be needed or ordered today. We also reviewed her medications today. she has been encouraged to call the office with any questions or concerns that should arise related to todays visit.    Orders Placed This Encounter  Procedures  . POCT Urine Drug Screen     Time spent:25 Minutes  Dr Lavera Guise Internal medicine

## 2019-07-30 ENCOUNTER — Other Ambulatory Visit: Payer: Self-pay | Admitting: Internal Medicine

## 2019-07-31 NOTE — Telephone Encounter (Signed)
Last 07/14/19 next 12/06/19

## 2019-08-03 NOTE — Discharge Instructions (Signed)
Instructions after Total Hip Replacement ° ° °  Tremain Rucinski P. Odessa Nishi, Jr., M.D.    ° Dept. of Orthopaedics & Sports Medicine ° [facility] ° [address] ° [place], [place]  [address] ° Phone: [phone]   Fax: [phone] ° °  °DIET: °• Drink plenty of non-alcoholic fluids. °• Resume your normal diet. Include foods high in fiber. ° °ACTIVITY:  °• You may use crutches or a walker with weight-bearing as tolerated, unless instructed otherwise. °• You may be weaned off of the walker or crutches by your Physical Therapist.  °• Do NOT reach below the level of your knees or cross your legs until allowed.    °• Continue doing gentle exercises. Exercising will reduce the pain and swelling, increase motion, and prevent muscle weakness.   °• Please continue to use the TED compression stockings for 6 weeks. You may remove the stockings at night, but should reapply them in the morning. °• Do not drive or operate any equipment until instructed. ° °WOUND CARE:  °• Continue to use ice packs periodically to reduce pain and swelling. °• Keep the incision clean and dry. °• You may bathe or shower after the staples are removed at the first office visit following surgery. ° °MEDICATIONS: °• You may resume your regular medications. °• Please take the pain medication as prescribed on the medication. °• Do not take pain medication on an empty stomach. °• You have been given a prescription for a blood thinner to prevent blood clots. Please take the medication as instructed. (NOTE: After completing a 2 week course of Lovenox, take one Enteric-coated aspirin once a day.) °• Pain medications and iron supplements can cause constipation. Use a stool softener (Senokot or Colace) on a daily basis and a laxative (dulcolax or miralax) as needed. °• Do not drive or drink alcoholic beverages when taking pain medications. ° °CALL THE OFFICE FOR: °• Temperature above 101 degrees °• Excessive bleeding or drainage on the dressing. °• Excessive  swelling, coldness, or paleness of the toes. °• Persistent nausea and vomiting. ° °FOLLOW-UP:  °• You should have an appointment to return to the office in 6 weeks after surgery. °• Arrangements have been made for continuation of Physical Therapy (either home therapy or outpatient therapy). °  °

## 2019-08-05 ENCOUNTER — Telehealth: Payer: Self-pay

## 2019-08-05 NOTE — Telephone Encounter (Signed)
error 

## 2019-08-07 NOTE — Telephone Encounter (Signed)
error 

## 2019-08-10 ENCOUNTER — Other Ambulatory Visit: Payer: Self-pay

## 2019-08-10 ENCOUNTER — Other Ambulatory Visit: Payer: Medicare Other

## 2019-08-10 ENCOUNTER — Encounter
Admission: RE | Admit: 2019-08-10 | Discharge: 2019-08-10 | Disposition: A | Discharge status: Home / Self Care | Payer: Medicare Other | Source: Ambulatory Visit | Attending: Orthopedic Surgery | Admitting: Orthopedic Surgery

## 2019-08-10 DIAGNOSIS — R9431 Abnormal electrocardiogram [ECG] [EKG]: Secondary | ICD-10-CM | POA: Insufficient documentation

## 2019-08-10 DIAGNOSIS — Z01818 Encounter for other preprocedural examination: Secondary | ICD-10-CM | POA: Insufficient documentation

## 2019-08-10 DIAGNOSIS — I4510 Unspecified right bundle-branch block: Secondary | ICD-10-CM | POA: Diagnosis not present

## 2019-08-10 DIAGNOSIS — R001 Bradycardia, unspecified: Secondary | ICD-10-CM | POA: Insufficient documentation

## 2019-08-10 HISTORY — DX: Dyspnea, unspecified: R06.00

## 2019-08-10 HISTORY — DX: Anxiety disorder, unspecified: F41.9

## 2019-08-10 LAB — SEDIMENTATION RATE: Sed Rate: 17 mm/hr (ref 0–30)

## 2019-08-10 LAB — URINALYSIS, ROUTINE W REFLEX MICROSCOPIC
Bilirubin Urine: NEGATIVE
Glucose, UA: NEGATIVE mg/dL
Hgb urine dipstick: NEGATIVE
Ketones, ur: NEGATIVE mg/dL
Leukocytes,Ua: NEGATIVE
Nitrite: NEGATIVE
Protein, ur: NEGATIVE mg/dL
Specific Gravity, Urine: 1.014 (ref 1.005–1.030)
pH: 5 (ref 5.0–8.0)

## 2019-08-10 LAB — COMPREHENSIVE METABOLIC PANEL
ALT: 19 U/L (ref 0–44)
AST: 31 U/L (ref 15–41)
Albumin: 4.6 g/dL (ref 3.5–5.0)
Alkaline Phosphatase: 75 U/L (ref 38–126)
Anion gap: 9 (ref 5–15)
BUN: 21 mg/dL (ref 8–23)
CO2: 26 mmol/L (ref 22–32)
Calcium: 9.9 mg/dL (ref 8.9–10.3)
Chloride: 105 mmol/L (ref 98–111)
Creatinine, Ser: 0.8 mg/dL (ref 0.44–1.00)
GFR calc Af Amer: >60 mL/min (ref >60)
GFR calc non Af Amer: >60 mL/min (ref >60)
Glucose, Bld: 76 mg/dL (ref 70–99)
Potassium: 3.6 mmol/L (ref 3.5–5.1)
Sodium: 140 mmol/L (ref 135–145)
Total Bilirubin: 0.8 mg/dL (ref 0.3–1.2)
Total Protein: 7.1 g/dL (ref 6.5–8.1)

## 2019-08-10 LAB — CBC
HCT: 36.3 % (ref 36.0–46.0)
Hemoglobin: 12 g/dL (ref 12.0–15.0)
MCH: 31.7 pg (ref 26.0–34.0)
MCHC: 33.1 g/dL (ref 30.0–36.0)
MCV: 95.8 fL (ref 80.0–100.0)
Platelets: 284 10*3/uL (ref 150–400)
RBC: 3.79 MIL/uL — ABNORMAL LOW (ref 3.87–5.11)
RDW: 13.2 % (ref 11.5–15.5)
WBC: 4.5 10*3/uL (ref 4.0–10.5)
nRBC: 0 % (ref 0.0–0.2)

## 2019-08-10 LAB — PROTIME-INR
INR: 1 (ref 0.8–1.2)
Prothrombin Time: 12.8 seconds (ref 11.4–15.2)

## 2019-08-10 LAB — TYPE AND SCREEN
ABO/RH(D): O POS
Antibody Screen: NEGATIVE

## 2019-08-10 LAB — SURGICAL PCR SCREEN
MRSA, PCR: NEGATIVE
Staphylococcus aureus: NEGATIVE

## 2019-08-10 LAB — APTT: aPTT: 29 seconds (ref 24–36)

## 2019-08-10 LAB — C-REACTIVE PROTEIN: CRP: <0.8 mg/dL (ref <1.0)

## 2019-08-10 MED ORDER — ENSURE PRE-SURGERY PO LIQD
296.0000 mL | Freq: Once | ORAL | Status: DC
  Filled 2019-08-10: qty 296

## 2019-08-10 NOTE — Patient Instructions (Addendum)
Your procedure is scheduled on: 08/17/2019 Mon Report to Same Day Surgery 2nd floor medical mall [facility] Entrance-take elevator on left to 2nd floor.  Check in with surgery information desk.) To find out your arrival time please call [phone] between 1PM - 3PM on 08/14/2019 Fri  Remember: Instructions that are not followed completely may result in serious medical risk, up to and including death, or upon the discretion of your surgeon and anesthesiologist your surgery may need to be rescheduled.    _x___ 1. Do not eat food after midnight the night before your procedure. You may drink clear liquids up to 2 hours before you are scheduled to arrive at the hospital for your procedure.  Do not drink clear liquids within 2 hours of your scheduled arrival to the hospital.  Clear liquids include  --Water or Apple juice without pulp  --Clear carbohydrate beverage such as ClearFast or Gatorade  --Black Coffee or Clear Tea (No milk, no creamers, do not add anything to                  the coffee or Tea Type 1 and type 2 diabetics should only drink water.   ____Ensure clear carbohydrate drink on the way to the hospital for bariatric patients  _x___Ensure clear carbohydrate drink 3 hours before surgery.  Complete drink 1.5 hours before surgery   No gum chewing or hard candies.     __x__ 2. No Alcohol for 24 hours before or after surgery.   __x__3. No Smoking or e-cigarettes for 24 prior to surgery.  Do not use any chewable tobacco products for at least 6 hour prior to surgery   ____  4. Bring all medications with you on the day of surgery if instructed.    __x__ 5. Notify your doctor if there is any change in your medical condition     (cold, fever, infections).    x___6. On the morning of surgery brush your teeth with toothpaste and water.  You may rinse your mouth with mouth wash if you wish.  Do not swallow any toothpaste or mouthwash.   Do not wear jewelry, make-up, hairpins, clips  or nail polish.  Do not wear lotions, powders, or perfumes. You may wear deodorant.  Do not shave 48 hours prior to surgery. Men may shave face and neck.  Do not bring valuables to the hospital.    [facility] is not responsible for any belongings or valuables.               Contacts, dentures or bridgework may not be worn into surgery.  Leave your suitcase in the car. After surgery it may be brought to your room.  For patients admitted to the hospital, discharge time is determined by your                       treatment team.  _  Patients discharged the day of surgery will not be allowed to drive home.  You will need someone to drive you home and stay with you the night of your procedure.    Please read over the following fact sheets that you were given:   [facility] Preparing for Surgery and or MRSA Information   _x___ Take anti-hypertensive listed below, cardiac, seizure, asthma,     anti-reflux and psychiatric medicines. These include:  1. amLODipine (NORVASC) 5 MG tablet  2.desipramine (NORPRAMIN) 25 MG tablet if needed  3.hydrALAZINE (APRESOLINE) 50 MG tablet  4.levothyroxine (SYNTHROID, LEVOTHROID) 50 MCG tablet  5.omeprazole (PRILOSEC) 20 MG capsule  6.WIXELA INHUB 250-50 MCG/DOSE AEPB  ____Fleets enema or Magnesium Citrate as directed.   _x___ Use CHG Soap or sage wipes as directed on instruction sheet   _x___ Use inhalers on the day of surgery and bring to hospital day of surgery  ____ Stop Metformin and Janumet 2 days prior to surgery.    ____ Take 1/2 of usual insulin dose the night before surgery and none on the morning     surgery.   _x___ Follow recommendations from Cardiologist, Pulmonologist or PCP regarding          stopping Aspirin, Coumadin, Plavix ,Eliquis, Effient, or Pradaxa, and Pletal.  X____Stop Anti-inflammatories such as Advil, Aleve, Ibuprofen, Motrin, Naproxen, Naprosyn, Goodies powders or aspirin products. OK to take Tylenol and                           Celebrex.   _x___ Stop supplements until after surgery.  But may continue Vitamin D, Vitamin B,       and multivitamin.   ____ Bring C-Pap to the hospital.

## 2019-08-10 NOTE — Pre-Procedure Instructions (Signed)
Ensure and incentive spirometry given along with instructions.  

## 2019-08-11 LAB — URINE CULTURE
Culture: NO GROWTH
Special Requests: NORMAL

## 2019-08-12 LAB — IGE: IgE (Immunoglobulin E), Serum: 21 IU/mL (ref 6–495)

## 2019-08-13 ENCOUNTER — Other Ambulatory Visit: Admission: RE | Admit: 2019-08-13 | Payer: Medicare Other | Source: Ambulatory Visit

## 2019-08-14 ENCOUNTER — Other Ambulatory Visit: Payer: Self-pay

## 2019-08-14 ENCOUNTER — Other Ambulatory Visit
Admission: RE | Admit: 2019-08-14 | Discharge: 2019-08-14 | Disposition: A | Discharge status: Home / Self Care | Payer: Medicare Other | Source: Ambulatory Visit | Attending: Orthopedic Surgery | Admitting: Orthopedic Surgery

## 2019-08-14 DIAGNOSIS — I10 Essential (primary) hypertension: Secondary | ICD-10-CM | POA: Diagnosis not present

## 2019-08-14 DIAGNOSIS — Z471 Aftercare following joint replacement surgery: Secondary | ICD-10-CM | POA: Diagnosis not present

## 2019-08-14 DIAGNOSIS — Z7689 Persons encountering health services in other specified circumstances: Secondary | ICD-10-CM | POA: Diagnosis not present

## 2019-08-14 DIAGNOSIS — Z880 Allergy status to penicillin: Secondary | ICD-10-CM | POA: Diagnosis not present

## 2019-08-14 DIAGNOSIS — Z888 Allergy status to other drugs, medicaments and biological substances status: Secondary | ICD-10-CM | POA: Diagnosis not present

## 2019-08-14 DIAGNOSIS — Z96649 Presence of unspecified artificial hip joint: Secondary | ICD-10-CM | POA: Diagnosis not present

## 2019-08-14 DIAGNOSIS — L309 Dermatitis, unspecified: Secondary | ICD-10-CM | POA: Diagnosis not present

## 2019-08-14 DIAGNOSIS — Z833 Family history of diabetes mellitus: Secondary | ICD-10-CM | POA: Diagnosis not present

## 2019-08-14 DIAGNOSIS — M1611 Unilateral primary osteoarthritis, right hip: Secondary | ICD-10-CM | POA: Diagnosis not present

## 2019-08-14 DIAGNOSIS — K219 Gastro-esophageal reflux disease without esophagitis: Secondary | ICD-10-CM | POA: Diagnosis not present

## 2019-08-14 DIAGNOSIS — Z20828 Contact with and (suspected) exposure to other viral communicable diseases: Secondary | ICD-10-CM | POA: Diagnosis present

## 2019-08-14 DIAGNOSIS — Z79890 Hormone replacement therapy (postmenopausal): Secondary | ICD-10-CM | POA: Diagnosis not present

## 2019-08-14 DIAGNOSIS — Z8249 Family history of ischemic heart disease and other diseases of the circulatory system: Secondary | ICD-10-CM | POA: Diagnosis not present

## 2019-08-14 DIAGNOSIS — Z79899 Other long term (current) drug therapy: Secondary | ICD-10-CM | POA: Diagnosis not present

## 2019-08-14 DIAGNOSIS — M47812 Spondylosis without myelopathy or radiculopathy, cervical region: Secondary | ICD-10-CM | POA: Diagnosis not present

## 2019-08-14 DIAGNOSIS — J449 Chronic obstructive pulmonary disease, unspecified: Secondary | ICD-10-CM | POA: Diagnosis not present

## 2019-08-14 DIAGNOSIS — K589 Irritable bowel syndrome without diarrhea: Secondary | ICD-10-CM | POA: Diagnosis not present

## 2019-08-14 DIAGNOSIS — Z882 Allergy status to sulfonamides status: Secondary | ICD-10-CM | POA: Diagnosis not present

## 2019-08-14 DIAGNOSIS — M810 Age-related osteoporosis without current pathological fracture: Secondary | ICD-10-CM | POA: Diagnosis not present

## 2019-08-14 DIAGNOSIS — Z23 Encounter for immunization: Secondary | ICD-10-CM | POA: Diagnosis not present

## 2019-08-14 DIAGNOSIS — Z885 Allergy status to narcotic agent status: Secondary | ICD-10-CM | POA: Diagnosis not present

## 2019-08-14 DIAGNOSIS — Z87891 Personal history of nicotine dependence: Secondary | ICD-10-CM | POA: Diagnosis not present

## 2019-08-14 DIAGNOSIS — Z881 Allergy status to other antibiotic agents status: Secondary | ICD-10-CM | POA: Diagnosis not present

## 2019-08-14 DIAGNOSIS — M47816 Spondylosis without myelopathy or radiculopathy, lumbar region: Secondary | ICD-10-CM | POA: Diagnosis not present

## 2019-08-14 DIAGNOSIS — E039 Hypothyroidism, unspecified: Secondary | ICD-10-CM | POA: Diagnosis not present

## 2019-08-14 DIAGNOSIS — Z8042 Family history of malignant neoplasm of prostate: Secondary | ICD-10-CM | POA: Diagnosis not present

## 2019-08-14 DIAGNOSIS — Z96641 Presence of right artificial hip joint: Secondary | ICD-10-CM | POA: Diagnosis not present

## 2019-08-14 DIAGNOSIS — Z01812 Encounter for preprocedural laboratory examination: Secondary | ICD-10-CM | POA: Insufficient documentation

## 2019-08-14 LAB — SARS CORONAVIRUS 2 (TAT 6-24 HRS): SARS Coronavirus 2: NEGATIVE

## 2019-08-16 NOTE — H&P (Signed)
Signed . Encounter Date: 08/07/2019 . Gwenlyn Fudge, PA  [facility] Chief Complaint:       Chief Complaint  Patient presents with  . Hip Pain    H & P RIGHT HIP    History of Present Illness:    Pam Cruz is a 84 y.o. female that presents to clinic today for her preoperative history and evaluation.  Patient presents unaccompanied. The patient is scheduled to undergo a right total hip arthroplasty on 08/17/2019 by Dr. Marry Guan.  Patient reports a history of right hip and groin pain for over 20 years.  Pain is felt in the groin and is described as sharp..She denies associated numbness or tingling.   Patient does stay active by working out in the gym multiple days per week.  Of note, patient states that she is set to have an infected tooth removed on Monday by her dentist.  Her dentist is aware that she is scheduled to undergo a total hip arthroplasty.  The patient's symptoms have progressed to the point that they decrease her quality of life. The patient has previously undergone conservative treatment including NSAIDS and activity modification without adequate control of her symptoms.    Past Medical, Surgical, Family, Social History, Allergies, Medications:   Past Medical History:      Past Medical History:  Diagnosis Date  . Atypical chest pain    Cardiology workup negative  . COPD (chronic obstructive pulmonary disease) (CMS-HCC)   . Eczema, unspecified   . GERD (gastroesophageal reflux disease)   . H/O chronic bronchitis    and mild cardiopulmonary obstructive disease  . History of IBS 12/10/2014  . Hypertension   . Hypothyroid, unspecified   . Irritable bowel syndrome   . Low back pain   . OA (osteoarthritis)    a) lumbar spine, b) right hip, c) cervical spine  . Osteoporosis    a) s/p Boniva  . Right patella fracture    not requiring surgery    Past Surgical History:       Past  Surgical History:  Procedure Laterality Date  . COLONOSCOPY  10/25/2006   07/15/2001; Hyperplastic Polyps: CBF 10/2016; No repeat due to age per RTE (dw)  . CYSTOSCOPY    . EGD  02/14/2004, 07/15/2001, 08/23/1999   No repeat per RTE  . SIGMOIDOSCOPY  09/06/1999    Current Medications:        Current Outpatient Medications  Medication Sig Dispense Refill  . acetaminophen (TYLENOL) 500 MG tablet Take 1,000 mg by mouth once daily as needed    . amLODIPine (NORVASC) 5 MG tablet Take 5 mg by mouth nightly.    . clonazePAM (KLONOPIN) 0.5 MG tablet Take 0.5 mg by mouth once daily.    Marland Kitchen desipramine (NORPRAMIN) 25 MG tablet Take 25 mg by mouth nightly     . fluticasone (FLONASE) 50 mcg/actuation nasal spray Place 2 sprays into both nostrils once daily.    . fluticasone propion-salmeterol (WIXELA INHUB) 250-50 mcg/dose diskus inhaler USE 1 PUFF TWICE DAILY    . hydrALAZINE (APRESOLINE) 50 MG tablet Take 25 mg by mouth 3 (three) times daily.    Marland Kitchen levothyroxine (SYNTHROID, LEVOTHROID) 50 MCG tablet Take 50 mcg by mouth once daily. Take on an empty stomach with a glass of water at least 30-60 minutes before breakfast.    . losartan (COZAAR) 50 MG tablet     . montelukast (SINGULAIR) 10 mg tablet Take 10 mg  by mouth once daily.    . multivitamin with minerals, EYE, (PRESERVISION AREDS-2) soft gel capsule Take 1 capsule by mouth 2 (two) times daily with meals    . omeprazole (PRILOSEC) 20 MG DR capsule TAKE 1 CAPSULE BY MOUTH  TWICE DAILY 180 capsule 1  . sennosides-docusate (SENOKOT-S) 8.6-50 mg tablet Take 1 tablet by mouth once daily    . terbinafine HCL (LAMISIL AT) 1 % cream Apply topically     No current facility-administered medications for this visit.     Allergies:       Allergies  Allergen Reactions  . Erythromycin Nausea And Vomiting  . Metoclopramide Nausea And Vomiting  . Penicillins Nausea And Vomiting  . Zolpidem Tartrate Other (See  Comments)    Pt unsure   . Amoxicillin Abdominal Pain  . Codeine Sulfate Abdominal Pain  . Darvocet A500 [Propoxyphene N-Acetaminophen] Nausea and Vomiting  . Erythromycin Ethylsuccinate Abdominal Pain  . Penicillin V Potassium Abdominal Pain  . Reglan [Metoclopramide Hcl] Nausea and Vomiting  . Sulfa (Sulfonamide Antibiotics) Abdominal Pain and Nausea And Vomiting    Social History:  Social History        Socioeconomic History  . Marital status: Single    Spouse name: Not on file  . Number of children: 0  . Years of education: 16  . Highest education level: Bachelor's degree (e.g., BA, AB, BS)  Occupational History  . Occupation: Retired -Corporate treasurer, Insurance claims handler   Social Needs  . Financial resource strain: Not on file  . Food insecurity    Worry: Not on file    Inability: Not on file  . Transportation needs    Medical: Not on file    Non-medical: Not on file  Tobacco Use  . Smoking status: Former Smoker    Packs/day: 1.50    Years: 47.00    Pack years: 70.50    Types: Cigarettes    Quit date: 2003    Years since quitting: 17.8  . Smokeless tobacco: Never Used  Substance and Sexual Activity  . Alcohol use: Yes    Comment: occasionally  . Drug use: No  . Sexual activity: Defer  Lifestyle  . Physical activity    Days per week: Not on file    Minutes per session: Not on file  . Stress: Not on file  Relationships  . Social Herbalist on phone: Not on file    Gets together: Not on file    Attends religious service: Not on file    Active member of club or organization: Not on file    Attends meetings of clubs or organizations: Not on file    Relationship status: Not on file  Other Topics Concern  . Not on file  Social History Narrative  . Not on file    Family History:       Family History  Problem Relation Age of Onset  . High blood pressure (Hypertension) Mother   . Myocardial Infarction  (Heart attack) Father   . Cancer Brother        Bladder Cancer  . Diabetes Brother   . High blood pressure (Hypertension) Brother   . Prostate cancer Brother     Review of Systems:   A 10+ ROS was performed, reviewed, and the pertinent orthopaedic findings are documented in the HPI.    Physical Examination:   BP 140/80   Ht 139.7 cm (4\' 7" )   Wt (!) 35.7 kg (78 lb 12.8  oz)   BMI 18.31 kg/m   Patient is a well-developed, well-nourished female in no acute distress. Patient has normal mood and affect. Patient is alert and oriented to person, place, and time. Pupils are equal and round with synchronous movement. No injected sclera.    Cardiovascular: Regular rate and rhythm, with no murmurs, rubs, or gallops.  Distal pulses palpable.  Respiratory: Lungs clear to auscultation bilaterally.   RightHip: Pelvic tilt:Negative Limb lengths:Equal with the patient standing Soft tissue swelling:Negative Erythema:Negative Crepitance:Positive Tenderness:Greater trochanter is nontender to palpation. Severepain is elicited by axial compression or extremes of rotation. Atrophy:No atrophy. Fair to goodhip flexor and abductor strength. Range of Motion:EXT/FLEX: 0/0/90ADD/ABD: 20/0/20IR/ER: 10/0/20  Tests Performed/Reviewed:  X-rays  No new radiographs were obtained today. Previous radiographs were reviewed of the right hip and revealed end-stage arthritic changes with complete loss of joint space, bone-on-bone arthritis, and deformation of the femoral head.  No fractures noted.  Impression:     ICD-10-CM  1. Primary osteoarthritis of right hip  M16.11   Plan:   The patient has end-stage degenerative changes of the right hip.  It was explained to the patient that the condition is progressive in nature.   Having failed conservative treatment, the patient has elected to proceed with a total joint arthroplasty.  The patient will undergo a total hip arthroplasty through posterior approach with Dr. Marry Guan.  The risks of surgery, including blood clot and infection, were discussed with the patient.  Measures to reduce these risks, including the use of anticoagulation, perioperative antibiotics, and early ambulation were discussed.  The importance of postoperative physical therapy was discussed with the patient. The patient elects to proceed with surgery. The patient is instructed to stop all blood thinners prior to surgery.  The patient is instructed to call the hospital the day before surgery to learn of the proper arrival time.    Contact our office with any questions or concerns.  Follow up as indicated, or sooner should any new problems arise, if conditions worsen, or if they are otherwise concerned.   Gwenlyn Fudge, PA [facility] [place],  [address] Phone: [phone]  This note was generated in part with voice recognition software and I apologize or any typographical errors that were not detected and corrected.

## 2019-08-17 ENCOUNTER — Inpatient Hospital Stay
Admission: RE | Admit: 2019-08-17 | Discharge: 2019-08-19 | DRG: 470 | Disposition: A | Discharge status: Home Health | Payer: Medicare Other | Attending: Orthopedic Surgery | Admitting: Orthopedic Surgery

## 2019-08-17 ENCOUNTER — Inpatient Hospital Stay: Payer: Medicare Other | Admitting: Anesthesiology

## 2019-08-17 ENCOUNTER — Other Ambulatory Visit: Payer: Self-pay

## 2019-08-17 ENCOUNTER — Inpatient Hospital Stay: Payer: Medicare Other

## 2019-08-17 ENCOUNTER — Encounter
Admission: RE | Disposition: A | Discharge status: Home Health | Payer: Self-pay | Source: Home / Self Care | Attending: Orthopedic Surgery

## 2019-08-17 DIAGNOSIS — Z881 Allergy status to other antibiotic agents status: Secondary | ICD-10-CM | POA: Diagnosis not present

## 2019-08-17 DIAGNOSIS — Z880 Allergy status to penicillin: Secondary | ICD-10-CM

## 2019-08-17 DIAGNOSIS — I10 Essential (primary) hypertension: Secondary | ICD-10-CM | POA: Diagnosis present

## 2019-08-17 DIAGNOSIS — Z888 Allergy status to other drugs, medicaments and biological substances status: Secondary | ICD-10-CM

## 2019-08-17 DIAGNOSIS — M47816 Spondylosis without myelopathy or radiculopathy, lumbar region: Secondary | ICD-10-CM | POA: Diagnosis present

## 2019-08-17 DIAGNOSIS — K219 Gastro-esophageal reflux disease without esophagitis: Secondary | ICD-10-CM | POA: Diagnosis present

## 2019-08-17 DIAGNOSIS — Z79890 Hormone replacement therapy (postmenopausal): Secondary | ICD-10-CM

## 2019-08-17 DIAGNOSIS — J449 Chronic obstructive pulmonary disease, unspecified: Secondary | ICD-10-CM | POA: Diagnosis present

## 2019-08-17 DIAGNOSIS — Z833 Family history of diabetes mellitus: Secondary | ICD-10-CM

## 2019-08-17 DIAGNOSIS — Z20828 Contact with and (suspected) exposure to other viral communicable diseases: Secondary | ICD-10-CM | POA: Diagnosis present

## 2019-08-17 DIAGNOSIS — Z23 Encounter for immunization: Secondary | ICD-10-CM

## 2019-08-17 DIAGNOSIS — Z79899 Other long term (current) drug therapy: Secondary | ICD-10-CM

## 2019-08-17 DIAGNOSIS — M47812 Spondylosis without myelopathy or radiculopathy, cervical region: Secondary | ICD-10-CM | POA: Diagnosis present

## 2019-08-17 DIAGNOSIS — Z87891 Personal history of nicotine dependence: Secondary | ICD-10-CM | POA: Diagnosis not present

## 2019-08-17 DIAGNOSIS — Z882 Allergy status to sulfonamides status: Secondary | ICD-10-CM | POA: Diagnosis not present

## 2019-08-17 DIAGNOSIS — Z8042 Family history of malignant neoplasm of prostate: Secondary | ICD-10-CM

## 2019-08-17 DIAGNOSIS — E039 Hypothyroidism, unspecified: Secondary | ICD-10-CM | POA: Diagnosis present

## 2019-08-17 DIAGNOSIS — M1611 Unilateral primary osteoarthritis, right hip: Principal | ICD-10-CM | POA: Diagnosis present

## 2019-08-17 DIAGNOSIS — K589 Irritable bowel syndrome without diarrhea: Secondary | ICD-10-CM | POA: Diagnosis present

## 2019-08-17 DIAGNOSIS — M810 Age-related osteoporosis without current pathological fracture: Secondary | ICD-10-CM | POA: Diagnosis present

## 2019-08-17 DIAGNOSIS — Z885 Allergy status to narcotic agent status: Secondary | ICD-10-CM

## 2019-08-17 DIAGNOSIS — L309 Dermatitis, unspecified: Secondary | ICD-10-CM | POA: Diagnosis present

## 2019-08-17 DIAGNOSIS — Z8249 Family history of ischemic heart disease and other diseases of the circulatory system: Secondary | ICD-10-CM

## 2019-08-17 DIAGNOSIS — Z96649 Presence of unspecified artificial hip joint: Secondary | ICD-10-CM

## 2019-08-17 HISTORY — PX: TOTAL HIP ARTHROPLASTY: SHX124

## 2019-08-17 LAB — ABO/RH: ABO/RH(D): O POS

## 2019-08-17 SURGERY — ARTHROPLASTY, HIP, TOTAL,POSTERIOR APPROACH
Anesthesia: Spinal | Site: Hip | Laterality: Right

## 2019-08-17 MED ORDER — TRANEXAMIC ACID-NACL 1000-0.7 MG/100ML-% IV SOLN
1000.0000 mg | INTRAVENOUS | Status: AC
  Administered 2019-08-17: 08:00:00 1000 mg via INTRAVENOUS
  Filled 2019-08-17: qty 100

## 2019-08-17 MED ORDER — CLONAZEPAM 0.5 MG PO TABS
0.7500 mg | ORAL_TABLET | Freq: Every day | ORAL | Status: DC
  Administered 2019-08-17 – 2019-08-18 (×2): 0.75 mg via ORAL
  Filled 2019-08-17 (×2): qty 2

## 2019-08-17 MED ORDER — ACETAMINOPHEN 10 MG/ML IV SOLN
INTRAVENOUS | Status: DC | PRN
  Administered 2019-08-17: 1000 mg via INTRAVENOUS

## 2019-08-17 MED ORDER — MOMETASONE FURO-FORMOTEROL FUM 200-5 MCG/ACT IN AERO
2.0000 | INHALATION_SPRAY | Freq: Two times a day (BID) | RESPIRATORY_TRACT | Status: DC
  Administered 2019-08-17: 2 via RESPIRATORY_TRACT
  Filled 2019-08-17 (×2): qty 8.8

## 2019-08-17 MED ORDER — ONDANSETRON HCL 4 MG/2ML IJ SOLN: 4.0000 mg | Freq: Once | INTRAMUSCULAR | Status: DC | PRN

## 2019-08-17 MED ORDER — METOCLOPRAMIDE HCL 10 MG PO TABS
5.0000 mg | ORAL_TABLET | Freq: Three times a day (TID) | ORAL | Status: DC | PRN
  Administered 2019-08-18: 10 mg via ORAL

## 2019-08-17 MED ORDER — CHLORHEXIDINE GLUCONATE 4 % EX LIQD: 60.0000 mL | Freq: Once | CUTANEOUS | Status: DC

## 2019-08-17 MED ORDER — ONDANSETRON HCL 4 MG PO TABS: 4.0000 mg | ORAL_TABLET | Freq: Four times a day (QID) | ORAL | Status: DC | PRN

## 2019-08-17 MED ORDER — OXYCODONE HCL 5 MG PO TABS
5.0000 mg | ORAL_TABLET | ORAL | Status: DC | PRN
  Administered 2019-08-17 – 2019-08-19 (×2): 5 mg via ORAL
  Filled 2019-08-17: qty 1

## 2019-08-17 MED ORDER — CLINDAMYCIN PHOSPHATE 900 MG/50ML IV SOLN
900.0000 mg | INTRAVENOUS | Status: AC
  Administered 2019-08-17: 900 mg via INTRAVENOUS

## 2019-08-17 MED ORDER — DESIPRAMINE HCL 50 MG PO TABS
25.0000 mg | ORAL_TABLET | Freq: Every day | ORAL | Status: DC | PRN
  Filled 2019-08-17 (×2): qty 1

## 2019-08-17 MED ORDER — FENTANYL CITRATE (PF) 100 MCG/2ML IJ SOLN
INTRAMUSCULAR | Status: DC | PRN
  Administered 2019-08-17 (×2): 50 ug via INTRAVENOUS

## 2019-08-17 MED ORDER — MIDAZOLAM HCL 2 MG/2ML IJ SOLN
INTRAMUSCULAR | Status: AC
  Filled 2019-08-17: qty 2

## 2019-08-17 MED ORDER — ENOXAPARIN SODIUM 30 MG/0.3ML ~~LOC~~ SOLN
30.0000 mg | SUBCUTANEOUS | Status: DC
  Administered 2019-08-18 – 2019-08-19 (×2): 30 mg via SUBCUTANEOUS
  Filled 2019-08-17 (×2): qty 0.3

## 2019-08-17 MED ORDER — PHENYLEPHRINE HCL (PRESSORS) 10 MG/ML IV SOLN
INTRAVENOUS | Status: AC
  Filled 2019-08-17: qty 1

## 2019-08-17 MED ORDER — CELECOXIB 200 MG PO CAPS
200.0000 mg | ORAL_CAPSULE | Freq: Two times a day (BID) | ORAL | Status: DC
  Administered 2019-08-17 – 2019-08-19 (×4): 200 mg via ORAL
  Filled 2019-08-17 (×4): qty 1

## 2019-08-17 MED ORDER — GABAPENTIN 300 MG PO CAPS
300.0000 mg | ORAL_CAPSULE | Freq: Every day | ORAL | Status: DC
  Administered 2019-08-17 – 2019-08-18 (×2): 300 mg via ORAL
  Filled 2019-08-17 (×2): qty 1

## 2019-08-17 MED ORDER — PROPOFOL 500 MG/50ML IV EMUL
INTRAVENOUS | Status: AC
  Filled 2019-08-17: qty 50

## 2019-08-17 MED ORDER — ACETAMINOPHEN 10 MG/ML IV SOLN
1000.0000 mg | Freq: Four times a day (QID) | INTRAVENOUS | Status: AC
  Administered 2019-08-17 – 2019-08-18 (×4): 1000 mg via INTRAVENOUS
  Filled 2019-08-17 (×4): qty 100

## 2019-08-17 MED ORDER — FENTANYL CITRATE (PF) 100 MCG/2ML IJ SOLN
INTRAMUSCULAR | Status: AC
  Administered 2019-08-17: 25 ug via INTRAVENOUS
  Filled 2019-08-17: qty 2

## 2019-08-17 MED ORDER — MONTELUKAST SODIUM 10 MG PO TABS
10.0000 mg | ORAL_TABLET | Freq: Every day | ORAL | Status: DC
  Administered 2019-08-17 – 2019-08-19 (×3): 10 mg via ORAL
  Filled 2019-08-17 (×3): qty 1

## 2019-08-17 MED ORDER — LACTATED RINGERS IV SOLN
INTRAVENOUS | Status: DC
  Administered 2019-08-17: 06:00:00 via INTRAVENOUS

## 2019-08-17 MED ORDER — FLEET ENEMA 7-19 GM/118ML RE ENEM: 1.0000 | ENEMA | Freq: Once | RECTAL | Status: DC | PRN

## 2019-08-17 MED ORDER — CLINDAMYCIN PHOSPHATE 600 MG/50ML IV SOLN
600.0000 mg | Freq: Four times a day (QID) | INTRAVENOUS | Status: AC
  Administered 2019-08-17 – 2019-08-18 (×3): 600 mg via INTRAVENOUS
  Filled 2019-08-17 (×4): qty 50

## 2019-08-17 MED ORDER — HYDROMORPHONE HCL 1 MG/ML IJ SOLN: 0.5000 mg | INTRAMUSCULAR | Status: DC | PRN

## 2019-08-17 MED ORDER — PHENOL 1.4 % MT LIQD
1.0000 | OROMUCOSAL | Status: DC | PRN
  Filled 2019-08-17: qty 177

## 2019-08-17 MED ORDER — FERROUS SULFATE 325 (65 FE) MG PO TABS
325.0000 mg | ORAL_TABLET | Freq: Two times a day (BID) | ORAL | Status: DC
  Administered 2019-08-17 – 2019-08-19 (×4): 325 mg via ORAL
  Filled 2019-08-17 (×4): qty 1

## 2019-08-17 MED ORDER — DEXAMETHASONE SODIUM PHOSPHATE 10 MG/ML IJ SOLN
INTRAMUSCULAR | Status: AC
  Administered 2019-08-17: 8 mg via INTRAVENOUS
  Filled 2019-08-17: qty 1

## 2019-08-17 MED ORDER — DIPHENHYDRAMINE HCL 12.5 MG/5ML PO ELIX: 12.5000 mg | ORAL_SOLUTION | ORAL | Status: DC | PRN

## 2019-08-17 MED ORDER — METAXALONE 800 MG PO TABS
800.0000 mg | ORAL_TABLET | Freq: Three times a day (TID) | ORAL | Status: DC | PRN
  Administered 2019-08-17: 800 mg via ORAL
  Filled 2019-08-17 (×2): qty 1

## 2019-08-17 MED ORDER — PROPOFOL 10 MG/ML IV BOLUS
INTRAVENOUS | Status: DC | PRN
  Administered 2019-08-17 (×2): 7 mg via INTRAVENOUS

## 2019-08-17 MED ORDER — SODIUM CHLORIDE 0.9 % IV SOLN
INTRAVENOUS | Status: DC | PRN
  Administered 2019-08-17: 30 ug/min via INTRAVENOUS

## 2019-08-17 MED ORDER — ONDANSETRON HCL 4 MG/2ML IJ SOLN
4.0000 mg | Freq: Four times a day (QID) | INTRAMUSCULAR | Status: DC | PRN
  Administered 2019-08-17: 4 mg via INTRAVENOUS
  Filled 2019-08-17: qty 2

## 2019-08-17 MED ORDER — TRAMADOL HCL 50 MG PO TABS
50.0000 mg | ORAL_TABLET | ORAL | Status: DC | PRN
  Administered 2019-08-18 (×2): 50 mg via ORAL
  Filled 2019-08-17 (×2): qty 1

## 2019-08-17 MED ORDER — ACETAMINOPHEN 10 MG/ML IV SOLN
INTRAVENOUS | Status: AC
  Filled 2019-08-17: qty 100

## 2019-08-17 MED ORDER — GABAPENTIN 300 MG PO CAPS
300.0000 mg | ORAL_CAPSULE | Freq: Once | ORAL | Status: AC
  Administered 2019-08-17: 07:00:00 300 mg via ORAL

## 2019-08-17 MED ORDER — HYDRALAZINE HCL 25 MG PO TABS
25.0000 mg | ORAL_TABLET | Freq: Three times a day (TID) | ORAL | Status: DC
  Administered 2019-08-17 – 2019-08-19 (×4): 25 mg via ORAL
  Filled 2019-08-17 (×4): qty 1

## 2019-08-17 MED ORDER — SODIUM CHLORIDE 0.9 % IV SOLN
INTRAVENOUS | Status: DC
  Administered 2019-08-17 (×2): via INTRAVENOUS

## 2019-08-17 MED ORDER — PANTOPRAZOLE SODIUM 40 MG PO TBEC
40.0000 mg | DELAYED_RELEASE_TABLET | Freq: Two times a day (BID) | ORAL | Status: DC
  Administered 2019-08-17 – 2019-08-19 (×4): 40 mg via ORAL
  Filled 2019-08-17 (×4): qty 1

## 2019-08-17 MED ORDER — AMLODIPINE BESYLATE 5 MG PO TABS
7.5000 mg | ORAL_TABLET | Freq: Every day | ORAL | Status: DC
  Administered 2019-08-17 – 2019-08-19 (×2): 7.5 mg via ORAL
  Filled 2019-08-17 (×3): qty 2

## 2019-08-17 MED ORDER — METOCLOPRAMIDE HCL 10 MG PO TABS
10.0000 mg | ORAL_TABLET | Freq: Three times a day (TID) | ORAL | Status: DC
  Administered 2019-08-19 (×2): 10 mg via ORAL
  Filled 2019-08-17 (×5): qty 1

## 2019-08-17 MED ORDER — TERBINAFINE HCL 1 % EX CREA: 1.0000 "application " | TOPICAL_CREAM | Freq: Two times a day (BID) | CUTANEOUS | Status: DC | PRN

## 2019-08-17 MED ORDER — GABAPENTIN 300 MG PO CAPS
ORAL_CAPSULE | ORAL | Status: AC
  Administered 2019-08-17: 300 mg via ORAL
  Filled 2019-08-17: qty 1

## 2019-08-17 MED ORDER — LEVOTHYROXINE SODIUM 25 MCG PO TABS
25.0000 ug | ORAL_TABLET | Freq: Every day | ORAL | Status: DC
  Administered 2019-08-18 – 2019-08-19 (×2): 25 ug via ORAL
  Filled 2019-08-17 (×2): qty 1

## 2019-08-17 MED ORDER — CELECOXIB 200 MG PO CAPS
ORAL_CAPSULE | ORAL | Status: AC
  Administered 2019-08-17: 400 mg via ORAL
  Filled 2019-08-17: qty 2

## 2019-08-17 MED ORDER — INFLUENZA VAC A&B SA ADJ QUAD 0.5 ML IM PRSY
0.5000 mL | PREFILLED_SYRINGE | INTRAMUSCULAR | Status: DC
  Filled 2019-08-17: qty 0.5

## 2019-08-17 MED ORDER — CLINDAMYCIN PHOSPHATE 900 MG/50ML IV SOLN
INTRAVENOUS | Status: AC
  Filled 2019-08-17: qty 50

## 2019-08-17 MED ORDER — FENTANYL CITRATE (PF) 100 MCG/2ML IJ SOLN
INTRAMUSCULAR | Status: AC
  Filled 2019-08-17: qty 2

## 2019-08-17 MED ORDER — TRANEXAMIC ACID-NACL 1000-0.7 MG/100ML-% IV SOLN
1000.0000 mg | Freq: Once | INTRAVENOUS | Status: AC
  Administered 2019-08-17: 1000 mg via INTRAVENOUS
  Filled 2019-08-17: qty 100

## 2019-08-17 MED ORDER — FLUTICASONE PROPIONATE 50 MCG/ACT NA SUSP
2.0000 | Freq: Every day | NASAL | Status: DC
  Administered 2019-08-17: 2 via NASAL
  Filled 2019-08-17: qty 16

## 2019-08-17 MED ORDER — SENNOSIDES-DOCUSATE SODIUM 8.6-50 MG PO TABS
1.0000 | ORAL_TABLET | Freq: Two times a day (BID) | ORAL | Status: DC
  Administered 2019-08-17 – 2019-08-19 (×4): 1 via ORAL
  Filled 2019-08-17 (×4): qty 1

## 2019-08-17 MED ORDER — GLYCOPYRROLATE 0.2 MG/ML IJ SOLN
INTRAMUSCULAR | Status: AC
  Filled 2019-08-17: qty 1

## 2019-08-17 MED ORDER — PROPOFOL 500 MG/50ML IV EMUL
INTRAVENOUS | Status: DC | PRN
  Administered 2019-08-17: 30 ug/kg/min via INTRAVENOUS

## 2019-08-17 MED ORDER — PNEUMOCOCCAL VAC POLYVALENT 25 MCG/0.5ML IJ INJ
0.5000 mL | INJECTION | INTRAMUSCULAR | Status: AC
  Administered 2019-08-19: 0.5 mL via INTRAMUSCULAR
  Filled 2019-08-17: qty 0.5

## 2019-08-17 MED ORDER — MAGNESIUM HYDROXIDE 400 MG/5ML PO SUSP
30.0000 mL | Freq: Every day | ORAL | Status: DC
  Administered 2019-08-18: 30 mL via ORAL
  Filled 2019-08-17 (×2): qty 30

## 2019-08-17 MED ORDER — BUPIVACAINE HCL (PF) 0.5 % IJ SOLN
INTRAMUSCULAR | Status: AC
  Filled 2019-08-17: qty 10

## 2019-08-17 MED ORDER — MENTHOL 3 MG MT LOZG
1.0000 | LOZENGE | OROMUCOSAL | Status: DC | PRN
  Filled 2019-08-17: qty 9

## 2019-08-17 MED ORDER — OCUVITE-LUTEIN PO CAPS
1.0000 | ORAL_CAPSULE | Freq: Two times a day (BID) | ORAL | Status: DC
  Administered 2019-08-17 – 2019-08-19 (×4): 1 via ORAL
  Filled 2019-08-17 (×6): qty 1

## 2019-08-17 MED ORDER — ACETAMINOPHEN 325 MG PO TABS: 325.0000 mg | ORAL_TABLET | Freq: Four times a day (QID) | ORAL | Status: DC | PRN

## 2019-08-17 MED ORDER — NEOMYCIN-POLYMYXIN B GU 40-200000 IR SOLN
Status: AC
  Filled 2019-08-17: qty 20

## 2019-08-17 MED ORDER — FENTANYL CITRATE (PF) 100 MCG/2ML IJ SOLN
25.0000 ug | INTRAMUSCULAR | Status: DC | PRN
  Administered 2019-08-17 (×2): 25 ug via INTRAVENOUS

## 2019-08-17 MED ORDER — BISACODYL 10 MG RE SUPP: 10.0000 mg | Freq: Every day | RECTAL | Status: DC | PRN

## 2019-08-17 MED ORDER — LOSARTAN POTASSIUM 50 MG PO TABS
50.0000 mg | ORAL_TABLET | Freq: Every day | ORAL | Status: DC
  Administered 2019-08-17 – 2019-08-19 (×2): 50 mg via ORAL
  Filled 2019-08-17 (×3): qty 1

## 2019-08-17 MED ORDER — LIDOCAINE HCL (PF) 2 % IJ SOLN
INTRAMUSCULAR | Status: AC
  Filled 2019-08-17: qty 10

## 2019-08-17 MED ORDER — NEOMYCIN-POLYMYXIN B GU 40-200000 IR SOLN
Status: DC | PRN
  Administered 2019-08-17: 14 mL

## 2019-08-17 MED ORDER — DEXAMETHASONE SODIUM PHOSPHATE 10 MG/ML IJ SOLN
8.0000 mg | Freq: Once | INTRAMUSCULAR | Status: AC
  Administered 2019-08-17: 07:00:00 8 mg via INTRAVENOUS

## 2019-08-17 MED ORDER — ALUM & MAG HYDROXIDE-SIMETH 200-200-20 MG/5ML PO SUSP: 30.0000 mL | ORAL | Status: DC | PRN

## 2019-08-17 MED ORDER — METOCLOPRAMIDE HCL 5 MG/ML IJ SOLN: 5.0000 mg | Freq: Three times a day (TID) | INTRAMUSCULAR | Status: DC | PRN

## 2019-08-17 MED ORDER — OXYCODONE HCL 5 MG PO TABS
10.0000 mg | ORAL_TABLET | ORAL | Status: DC | PRN
  Administered 2019-08-17 – 2019-08-18 (×2): 10 mg via ORAL
  Filled 2019-08-17 (×3): qty 2

## 2019-08-17 MED ORDER — BUPIVACAINE HCL (PF) 0.5 % IJ SOLN
INTRAMUSCULAR | Status: DC | PRN
  Administered 2019-08-17: 3 mL

## 2019-08-17 MED ORDER — CELECOXIB 200 MG PO CAPS
400.0000 mg | ORAL_CAPSULE | Freq: Once | ORAL | Status: AC
  Administered 2019-08-17: 07:00:00 400 mg via ORAL

## 2019-08-17 SURGICAL SUPPLY — 63 items
BLADE DRUM FLTD (BLADE) ×3 IMPLANT
BLADE SAW 90X25X1.19 OSCILLAT (BLADE) ×3 IMPLANT
CANISTER SUCT 1200ML W/VALVE (MISCELLANEOUS) ×3 IMPLANT
CANISTER SUCT 3000ML PPV (MISCELLANEOUS) ×6 IMPLANT
CARTRIDGE OIL MAESTRO DRILL (MISCELLANEOUS) ×1 IMPLANT
COVER WAND RF STERILE (DRAPES) ×3 IMPLANT
CUP ACET PNNCL SECTR W/GRIP 56 (Hips) IMPLANT
DIFFUSER DRILL AIR PNEUMATIC (MISCELLANEOUS) ×3 IMPLANT
DRAPE 3/4 80X56 (DRAPES) ×3 IMPLANT
DRAPE INCISE IOBAN 66X60 STRL (DRAPES) ×3 IMPLANT
DRSG DERMACEA 8X12 NADH (GAUZE/BANDAGES/DRESSINGS) ×3 IMPLANT
DRSG OPSITE POSTOP 4X12 (GAUZE/BANDAGES/DRESSINGS) ×3 IMPLANT
DRSG OPSITE POSTOP 4X14 (GAUZE/BANDAGES/DRESSINGS) IMPLANT
DRSG TEGADERM 4X4.75 (GAUZE/BANDAGES/DRESSINGS) ×3 IMPLANT
DURAPREP 26ML APPLICATOR (WOUND CARE) ×3 IMPLANT
ELECT REM PT RETURN 9FT ADLT (ELECTROSURGICAL) ×3
ELECTRODE REM PT RTRN 9FT ADLT (ELECTROSURGICAL) ×1 IMPLANT
GLOVE BIO SURGEON STRL SZ7.5 (GLOVE) ×6 IMPLANT
GLOVE BIOGEL M STRL SZ7.5 (GLOVE) ×6 IMPLANT
GLOVE BIOGEL PI IND STRL 7.5 (GLOVE) ×1 IMPLANT
GLOVE BIOGEL PI INDICATOR 7.5 (GLOVE) ×2
GLOVE INDICATOR 8.0 STRL GRN (GLOVE) ×3 IMPLANT
GOWN STRL REUS W/ TWL LRG LVL3 (GOWN DISPOSABLE) ×2 IMPLANT
GOWN STRL REUS W/ TWL XL LVL3 (GOWN DISPOSABLE) ×1 IMPLANT
GOWN STRL REUS W/TWL LRG LVL3 (GOWN DISPOSABLE) ×4
GOWN STRL REUS W/TWL XL LVL3 (GOWN DISPOSABLE) ×2
HEAD M SROM 36MM 2 (Hips) IMPLANT
HEMOVAC 400CC 10FR (MISCELLANEOUS) ×3 IMPLANT
HOLDER FOLEY CATH W/STRAP (MISCELLANEOUS) ×3 IMPLANT
HOOD PEEL AWAY FLYTE STAYCOOL (MISCELLANEOUS) ×6 IMPLANT
KIT TURNOVER KIT A (KITS) ×3 IMPLANT
LINER MARATHON 4 NEUTRAL 36X56 (Hips) ×2 IMPLANT
MANIFOLD NEPTUNE II (INSTRUMENTS) ×3 IMPLANT
NDL SAFETY ECLIPSE 18X1.5 (NEEDLE) ×1 IMPLANT
NEEDLE HYPO 18GX1.5 SHARP (NEEDLE) ×2
NS IRRIG 500ML POUR BTL (IV SOLUTION) ×3 IMPLANT
OIL CARTRIDGE MAESTRO DRILL (MISCELLANEOUS) ×3
PACK HIP PROSTHESIS (MISCELLANEOUS) ×3 IMPLANT
PENCIL SMOKE EVACUATOR COATED (MISCELLANEOUS) ×2 IMPLANT
PENCIL SMOKE ULTRAEVAC 22 CON (MISCELLANEOUS) ×1 IMPLANT
PIN STEIN THRED 5/32 (Pin) ×3 IMPLANT
PINN SECTOR W/GRIP ACE CUP 56 (Hips) ×3 IMPLANT
PULSAVAC PLUS IRRIG FAN TIP (DISPOSABLE) ×3
SCREW PINN CAN 6.5X20 (Screw) ×2 IMPLANT
SOL .9 NS 3000ML IRR  AL (IV SOLUTION) ×2
SOL .9 NS 3000ML IRR UROMATIC (IV SOLUTION) ×1 IMPLANT
SOL PREP PVP 2OZ (MISCELLANEOUS) ×3
SOLUTION PREP PVP 2OZ (MISCELLANEOUS) ×1 IMPLANT
SPONGE DRAIN TRACH 4X4 STRL 2S (GAUZE/BANDAGES/DRESSINGS) ×3 IMPLANT
SROM M HEAD 36MM 2 (Hips) ×3 IMPLANT
STAPLER SKIN PROX 35W (STAPLE) ×3 IMPLANT
STEM FEM CMNTLSS SM AML 15.0 (Hips) ×2 IMPLANT
SUT ETHIBOND #5 BRAIDED 30INL (SUTURE) ×3 IMPLANT
SUT VIC AB 0 CT1 36 (SUTURE) ×5 IMPLANT
SUT VIC AB 1 CT1 36 (SUTURE) ×6 IMPLANT
SUT VIC AB 2-0 CT1 27 (SUTURE) ×2
SUT VIC AB 2-0 CT1 TAPERPNT 27 (SUTURE) ×1 IMPLANT
SYR 20ML LL LF (SYRINGE) ×3 IMPLANT
TAPE CLOTH 3X10 WHT NS LF (GAUZE/BANDAGES/DRESSINGS) ×3 IMPLANT
TAPE TRANSPORE STRL 2 31045 (GAUZE/BANDAGES/DRESSINGS) ×3 IMPLANT
TIP FAN IRRIG PULSAVAC PLUS (DISPOSABLE) ×1 IMPLANT
TOWEL OR 17X26 4PK STRL BLUE (TOWEL DISPOSABLE) ×3 IMPLANT
TRAY FOLEY MTR SLVR 16FR STAT (SET/KITS/TRAYS/PACK) ×3 IMPLANT

## 2019-08-17 NOTE — Anesthesia Procedure Notes (Signed)
Spinal  Patient location during procedure: OR Start time: 08/17/2019 7:26 AM End time: 08/17/2019 7:38 AM Staffing Resident/CRNA: Bernardo Heater, CRNA Performed: resident/CRNA  Preanesthetic Checklist Completed: patient identified, site marked, surgical consent, pre-op evaluation, timeout performed, IV checked, risks and benefits discussed and monitors and equipment checked Spinal Block Patient position: sitting Prep: Betadine and ChloraPrep Patient monitoring: heart rate, continuous pulse ox, blood pressure and cardiac monitor Approach: midline Location: L3-4 Injection technique: single-shot Needle Needle type: Introducer and Pencan  Needle gauge: 24 G Needle length: 9 cm Additional Notes Negative paresthesia. Negative blood return. Positive free-flowing CSF. Expiration date of kit checked and confirmed. Patient tolerated procedure well, without complications.

## 2019-08-17 NOTE — TOC Benefit Eligibility Note (Signed)
Transition of Care [facility]) Benefit Eligibility Note    Patient Details  Name: Pam Cruz MRN: [id number] Date of Birth: September 07, 1935   Medication/Dose: Enoxaparin 29m once daily for 14 days  Covered?: Yes  Tier: Other(Tier 4)  Prescription Coverage Preferred Pharmacy: [facility] Person/Company/Phone Number:: MSharyn Lullwith [facility] at [phone] Co-Pay: $100.00 estimated copay  Prior Approval: No  Deductible: MGodleyPhone Number: [phone] [id number]/11/2018, 1:48 PM

## 2019-08-17 NOTE — Transfer of Care (Signed)
Immediate Anesthesia Transfer of Care Note  Patient: Pam Cruz  Procedure(s) Performed: TOTAL HIP ARTHROPLASTY (Right Hip)  Patient Location: PACU  Anesthesia Type:Spinal  Level of Consciousness: awake, oriented and patient cooperative  Airway & Oxygen Therapy: Patient Spontanous Breathing and Patient connected to nasal cannula oxygen  Post-op Assessment: Report given to RN and Post -op Vital signs reviewed and stable  Post vital signs: Reviewed and stable  Last Vitals:  Vitals Value Taken Time  BP    Temp    Pulse 57 08/17/19 1054  Resp 19 08/17/19 1054  SpO2 100 % 08/17/19 1054  Vitals shown include unvalidated device data.  Last Pain:  Vitals:   08/17/19 0609  TempSrc: Tympanic  PainSc: 0-No pain         Complications: No apparent anesthesia complications

## 2019-08-17 NOTE — Anesthesia Post-op Follow-up Note (Signed)
Anesthesia QCDR form completed.        

## 2019-08-17 NOTE — Op Note (Signed)
OPERATIVE NOTE  DATE OF SURGERY:  08/17/2019  PATIENT NAME:  Pam Cruz   DOB: 1934/11/04  MRN: [id number]  PRE-OPERATIVE DIAGNOSIS: Degenerative arthrosis of the right hip, primary  POST-OPERATIVE DIAGNOSIS:  Same  PROCEDURE:  Right total hip arthroplasty  SURGEON:  Marciano Sequin. M.D.  ASSISTANT: Cassell Smiles, PA-C (present and scrubbed throughout the case, critical for assistance with exposure, retraction, instrumentation, and closure)  ANESTHESIA: spinal  ESTIMATED BLOOD LOSS: 75 mL  FLUIDS REPLACED: 925 mL of crystalloid  DRAINS: 2 medium drains to a Hemovac reservoir  IMPLANTS UTILIZED: DePuy 15 mm small stature AML femoral stem, 56 mm OD Pinnacle Gription Sector acetabular component, 6.5 x 20 mm cancellous screw, +4 mm neutral Pinnacle Marathon polyethylene insert, and a 36 mm M-SPEC-2 mm hip ball  INDICATIONS FOR SURGERY: Pam Cruz is a 84 y.o. year old female with a long history of progressive hip and groin  pain. X-rays demonstrated severe degenerative changes. The patient had not seen any significant improvement despite conservative nonsurgical intervention. After discussion of the risks and benefits of surgical intervention, the patient expressed understanding of the risks benefits and agree with plans for total hip arthroplasty.   The risks, benefits, and alternatives were discussed at length including but not limited to the risks of infection, bleeding, nerve injury, stiffness, blood clots, the need for revision surgery, limb length inequality, dislocation, cardiopulmonary complications, among others, and they were willing to proceed.  PROCEDURE IN DETAIL: The patient was brought into the operating room and, after adequate spinal anesthesia was achieved, the patient was placed in a left lateral decubitus position. Axillary roll was placed and all bony prominences were well-padded. The patient's right hip was cleaned and prepped with alcohol and DuraPrep and  draped in the usual sterile fashion. A "timeout" was performed as per usual protocol. A lateral curvilinear incision was made gently curving towards the posterior superior iliac spine. The IT band was incised in line with the skin incision and the fibers of the gluteus maximus were split in line. The piriformis tendon was identified, skeletonized, and incised at its insertion to the proximal femur and reflected posteriorly. A T type posterior capsulotomy was performed. Prior to dislocation of the femoral head, a threaded Steinmann pin was inserted through a separate stab incision into the pelvis superior to the acetabulum and bent in the form of a stylus so as to assess limb length and hip offset throughout the procedure. The femoral head was then dislocated posteriorly. Inspection of the femoral head demonstrated severe degenerative changes with full-thickness loss of articular cartilage. The femoral neck cut was performed using an oscillating saw. The anterior capsule was elevated off of the femoral neck using a periosteal elevator. Attention was then directed to the acetabulum. The remnant of the labrum was excised using electrocautery. Inspection of the acetabulum also demonstrated significant degenerative changes. The acetabulum was reamed in sequential fashion up to a 55 mm diameter. Good punctate bleeding bone was encountered. A 56 mm Pinnacle Gription Sector acetabular component was positioned and impacted into place. Good scratch fit was appreciated.  A 6.5 mm x 20 mm cancellous screw was inserted through the dome hole for additional fixation.  A +4 mm neutral polyethylene trial was inserted.  Attention was then directed to the proximal femur. A hole for reaming of the proximal femoral canal was created using a high-speed burr. The femoral canal was reamed in sequential fashion up to a 14.5 mm diameter. This allowed for  approximately 6 cm of scratch fit. Serial broaches were inserted up to a 15 mm small  stature femoral broach. Calcar region was planed and a trial reduction was performed using a 36 mm hip ball with a -2 mm neck length. Good equalization of limb lengths and hip offset was appreciated and excellent stability was noted both anteriorly and posteriorly. Trial components were removed. The acetabular shell was irrigated with copious amounts of normal saline with antibiotic solution and suctioned dry. A +4 mm neutral Pinnacle Marathon polyethylene insert was positioned and impacted into place. Next, a 15 mm small stature AML femoral stem was positioned and impacted into place. Excellent scratch fit was appreciated. A trial reduction was again performed with a 36 mm hip ball with a -2 mm neck length. Again, good equalization of limb lengths was appreciated and excellent stability appreciated both anteriorly and posteriorly. The hip was then dislocated and the trial hip ball was removed. The Morse taper was cleaned and dried. A 36 mm M-SPEC hip ball with a -2 mm neck length was placed on the trunnion and impacted into place. The hip was then reduced and placed through range of motion. Excellent stability was appreciated both anteriorly and posteriorly.  The wound was irrigated with copious amounts of normal saline with antibiotic solution and suctioned dry. Good hemostasis was appreciated. The posterior capsulotomy was repaired using #5 Ethibond. Piriformis tendon was reapproximated to the undersurface of the gluteus medius tendon using #5 Ethibond. Two medium drains were placed in the wound bed and brought out through separate stab incisions to be attached to a Hemovac reservoir. The IT band was reapproximated using interrupted sutures of #1 Vicryl. Subcutaneous tissue was approximated using first #0 Vicryl followed by #2-0 Vicryl. The skin was closed with skin staples.  The patient tolerated the procedure well and was transported to the recovery room in stable condition.   Marciano Sequin., M.D.

## 2019-08-17 NOTE — Anesthesia Preprocedure Evaluation (Signed)
Anesthesia Evaluation  Patient identified by MRN, date of birth, ID band Patient awake    Reviewed: Allergy & Precautions, H&P , NPO status , Patient's Chart, lab work & pertinent test results, reviewed documented beta blocker date and time   History of Anesthesia Complications Negative for: history of anesthetic complications  Airway Mallampati: II  TM Distance: >3 FB Neck ROM: full    Dental  (+) Dental Advidsory Given   Pulmonary shortness of breath and with exertion, neg sleep apnea, COPD,  COPD inhaler, neg recent URI, former smoker,    Pulmonary exam normal        Cardiovascular Exercise Tolerance: Good hypertension, (-) angina(-) Past MI and (-) Cardiac Stents negative cardio ROS Normal cardiovascular exam(-) dysrhythmias (-) Valvular Problems/Murmurs     Neuro/Psych PSYCHIATRIC DISORDERS Anxiety negative neurological ROS     GI/Hepatic Neg liver ROS, GERD  ,  Endo/Other  neg diabetesHypothyroidism   Renal/GU negative Renal ROS  negative genitourinary   Musculoskeletal   Abdominal   Peds  Hematology negative hematology ROS (+)   Anesthesia Other Findings Past Medical History: No date: Anxiety No date: Arthritis No date: COPD (chronic obstructive pulmonary disease) (HCC) No date: Dyspnea No date: Gastritis No date: GERD (gastroesophageal reflux disease) No date: Hypertension No date: Osteoporosis   Reproductive/Obstetrics negative OB ROS                             Anesthesia Physical Anesthesia Plan  ASA: II  Anesthesia Plan: Spinal   Post-op Pain Management:    Induction:   PONV Risk Score and Plan: 2 and Propofol infusion and TIVA  Airway Management Planned: Natural Airway and Simple Face Mask  Additional Equipment:   Intra-op Plan:   Post-operative Plan:   Informed Consent: I have reviewed the patients History and Physical, chart, labs and discussed the  procedure including the risks, benefits and alternatives for the proposed anesthesia with the patient or authorized representative who has indicated his/her understanding and acceptance.     Dental Advisory Given  Plan Discussed with: Anesthesiologist, CRNA and Surgeon  Anesthesia Plan Comments:         Anesthesia Quick Evaluation

## 2019-08-17 NOTE — Evaluation (Signed)
Physical Therapy Evaluation Patient Details Name: Pam Cruz MRN: [id number] DOB: [birth year] Today's Date: 08/17/2019   History of Present Illness  Pt admitted R THR. Pt is POD 0 at time of evaluation.  Clinical Impression  Pt is a pleasant 84 year old female who was admitted for R THR. Pt performs bed mobility with cga, transfers with min assist, and ambulation with cga and RW. Educated on hip precautions. Pt demonstrates deficits with strength/mobility/pain. Would benefit from skilled PT to address above deficits and promote optimal return to PLOF. Recommend transition to [place] upon discharge from acute hospitalization.     Follow Up Recommendations Home health PT;Supervision for mobility/OOB    Equipment Recommendations  Rolling walker with 5" wheels    Recommendations for Other Services       Precautions / Restrictions Precautions Precautions: Fall;Posterior Hip Precaution Booklet Issued: No Restrictions Weight Bearing Restrictions: Yes RLE Weight Bearing: Weight bearing as tolerated      Mobility  Bed Mobility Overal bed mobility: Needs Assistance Bed Mobility: Supine to Sit     Supine to sit: Min guard     General bed mobility comments: safe technique with ease of movement noted. Cues for hip precautions  Transfers Overall transfer level: Needs assistance Equipment used: Rolling walker (2 wheeled) Transfers: Sit to/from Stand Sit to Stand: Min assist         General transfer comment: cues for hip precautions and sequencing. Once standing, upright posture noted  Ambulation/Gait Ambulation/Gait assistance: Min guard Gait Distance (Feet): 5 Feet Assistive device: Rolling walker (2 wheeled) Gait Pattern/deviations: Step-to pattern     General Gait Details: ambulated to recliner with slow step to gait pattern. Heavy cues for sequencing  Stairs            Wheelchair Mobility    Modified Rankin (Stroke Patients Only)       Balance Overall  balance assessment: Needs assistance Sitting-balance support: Feet supported Sitting balance-Leahy Scale: Good     Standing balance support: Bilateral upper extremity supported Standing balance-Leahy Scale: Good                               Pertinent Vitals/Pain Pain Assessment: 0-10 Pain Score: 4  Pain Location: R hip Pain Descriptors / Indicators: Operative site guarding Pain Intervention(s): Limited activity within patient's tolerance;Repositioned    Home Living Family/patient expects to be discharged to:: Private residence Living Arrangements: Alone Available Help at Discharge: Family(will have sister until Sunday) Type of Home: Apartment Home Access: Stairs to enter Entrance Stairs-Rails: None Entrance Stairs-Number of Steps: 1 Home Layout: One level Home Equipment: None      Prior Function Level of Independence: Independent         Comments: driving and indep with ADLs     Hand Dominance        Extremity/Trunk Assessment   Upper Extremity Assessment Upper Extremity Assessment: Overall WFL for tasks assessed    Lower Extremity Assessment Lower Extremity Assessment: Generalized weakness(R LE grossly 3/5; L LE grossly 4+/5)       Communication   Communication: No difficulties  Cognition Arousal/Alertness: Awake/alert Behavior During Therapy: WFL for tasks assessed/performed Overall Cognitive Status: Within Functional Limits for tasks assessed  General Comments      Exercises Other Exercises Other Exercises: supine ther-ex performed on B LE including AP, glut sets, SAQ, quad sets, and hip abd/add. All ther-ex performed x 10 reps with cga.   Assessment/Plan    PT Assessment Patient needs continued PT services  PT Problem List Decreased strength;Decreased balance;Decreased mobility;Decreased knowledge of use of DME;Pain       PT Treatment Interventions DME instruction;Gait  training;Stair training;Therapeutic exercise;Balance training    PT Goals (Current goals can be found in the Care Plan section)  Acute Rehab PT Goals Patient Stated Goal: to go home PT Goal Formulation: With patient Time For Goal Achievement: 08/31/19 Potential to Achieve Goals: Good    Frequency BID   Barriers to discharge        Co-evaluation               AM-PAC PT "6 Clicks" Mobility  Outcome Measure Help needed turning from your back to your side while in a flat bed without using bedrails?: A Little Help needed moving from lying on your back to sitting on the side of a flat bed without using bedrails?: A Little Help needed moving to and from a bed to a chair (including a wheelchair)?: A Little Help needed standing up from a chair using your arms (e.g., wheelchair or bedside chair)?: A Little Help needed to walk in hospital room?: A Lot Help needed climbing 3-5 steps with a railing? : A Lot 6 Click Score: 16    End of Session Equipment Utilized During Treatment: Gait belt Activity Tolerance: Patient tolerated treatment well Patient left: in chair;with chair alarm set;with SCD's reapplied Nurse Communication: Mobility status PT Visit Diagnosis: Muscle weakness (generalized) (M62.81);Difficulty in walking, not elsewhere classified (R26.2);Unsteadiness on feet (R26.81);Pain Pain - Right/Left: Right Pain - part of body: Hip    Time: [id number] PT Time Calculation (min) (ACUTE ONLY): 40 min   Charges:   PT Evaluation $PT Eval Low Complexity: 1 Low PT Treatments $Therapeutic Exercise: 23-37 mins        Greggory Stallion, PT, DPT [phone]   Keiley Levey 08/17/2019, 4:52 PM

## 2019-08-17 NOTE — TOC Progression Note (Signed)
Transition of Care [facility]) - Progression Note    Patient Details  Name: Pam Cruz MRN: [id number] Date of Birth: 16-Feb-1935  Transition of [facility]) CM/SW Contact  Su Hilt, RN Phone Number: 08/17/2019, 10:07 AM  Clinical Narrative:     Requested Lovenox Price       Expected Discharge Plan and Services                                                 Social Determinants of Health (SDOH) Interventions    Readmission Risk Interventions No flowsheet data found.

## 2019-08-17 NOTE — H&P (Signed)
The patient has been re-examined, and the chart reviewed, and there have been no interval changes to the documented history and physical.    The risks, benefits, and alternatives have been discussed at length. The patient expressed understanding of the risks benefits and agreed with plans for surgical intervention.  Audrielle Vankuren P. Sesar Madewell, Jr. M.D.    

## 2019-08-18 LAB — SURGICAL PATHOLOGY

## 2019-08-18 NOTE — TOC Progression Note (Signed)
Transition of Care [facility]) - Progression Note    Patient Details  Name: Pam Cruz MRN: [id number] Date of Birth: April 12, 1935  [facility]) CM/SW Contact  Su Hilt, RN Phone Number: 08/18/2019, 4:18 PM  Clinical Narrative:    Patient and family requested to see the case manager.  I met with the patient and her sister at the bedside and reviewed each of their questions.   They requested to discuss the plan for home.  The sister will be staying with the patient until Sunday.  I asked if someone will be checking on her daily.  She stated that yes they would check on her daily.  I explained that it is recommended for the patients to have as much support as possible however the patient does live alone and is independent at baseline.  I encouraged the family to check on her often if they would not be able to stay with her.  I also encouraged them to talk with the PT and OT that will be coming into the home to check on the patient's progress.  I explained that the patient will not be able to drive for some time and that she may need help with light house work and laundry for a while.  I explained that it might be helpful if someone could make sure she has easy meals that needs little prep or work.  The sister agreed to follow up and check on the patient regularly and to work with [facility] to see what the progress is to make a plan going forward Expected Discharge Plan: [place] Barriers to Discharge: Continued Medical Work up  Expected Discharge Plan and Services Expected Discharge Plan: [place]   Discharge Planning Services: CM Consult Post Acute Care Choice: [place] arrangements for the past 2 months: Apartment                 DME Arranged: 3-N-1, [place] [employer] DME Agency: [employer] Date DME Agency Contacted: 08/18/19 Time DME Agency Contacted: 1220 Representative spoke with at DME Agency: [place] [place] HH Arranged: PT, OT, Nurse's  Aide [place] Agency: [facility] [employer] Date [place]: 08/18/19 Time [place]: 1220 Representative spoke with at [place]: [place] ([place]) Interventions    Readmission Risk Interventions No flowsheet data found.

## 2019-08-18 NOTE — Anesthesia Postprocedure Evaluation (Signed)
Anesthesia Post Note  Patient: Pam Cruz  Procedure(s) Performed: TOTAL HIP ARTHROPLASTY (Right Hip)  Patient location during evaluation: Nursing Unit Anesthesia Type: Spinal Level of consciousness: oriented and awake and alert Pain management: pain level controlled Vital Signs Assessment: post-procedure vital signs reviewed and stable Respiratory status: spontaneous breathing and respiratory function stable Cardiovascular status: blood pressure returned to baseline and stable Postop Assessment: no headache, no backache, no apparent nausea or vomiting and patient able to bend at knees Anesthetic complications: no     Last Vitals:  Vitals:   08/17/19 2334 08/18/19 0426  BP: 127/65 120/64  Pulse: 63 65  Resp: 16 16  Temp: 36.5 C 36.9 C  SpO2: 96% 96%    Last Pain:  Vitals:   08/18/19 0426  TempSrc: Oral  PainSc:                  [name]

## 2019-08-18 NOTE — Progress Notes (Addendum)
   Subjective: 1 Day Post-Op Procedure(s) (LRB): TOTAL HIP ARTHROPLASTY (Right) Patient reports pain as well-controlled.   Patient is well, and has had no acute complaints or problems Plan is to go Home after hospital stay. Negative for chest pain and shortness of breath Fever: no Gastrointestinal: negative for nausea and vomiting.  Patient has not had a bowel movement.  Objective: Vital signs in last 24 hours: Temp:  [97.4 F (36.3 C)-98.4 F (36.9 C)] 98.4 F (36.9 C) (11/03 0426) Pulse Rate:  [57-65] 65 (11/03 0426) Resp:  [16-19] 16 (11/03 0426) BP: (120-162)/(59-85) 120/64 (11/03 0426) SpO2:  [95 %-100 %] 96 % (11/03 0426)  Intake/Output from previous day:  Intake/Output Summary (Last 24 hours) at 08/18/2019 0749 Last data filed at 08/18/2019 0555 Gross per 24 hour  Intake 2019.21 ml  Output 4525 ml  Net -2505.79 ml    Intake/Output this shift: No intake/output data recorded.  Labs: No results for input(s): HGB in the last 72 hours. No results for input(s): WBC, RBC, HCT, PLT in the last 72 hours. No results for input(s): NA, K, CL, CO2, BUN, CREATININE, GLUCOSE, CALCIUM in the last 72 hours. No results for input(s): LABPT, INR in the last 72 hours.   EXAM General - Patient is Alert, Appropriate and Oriented Extremity - Neurovascular intact Dorsiflexion/Plantar flexion intact Compartment soft Dressing/Incision - clean, no drainage.  Ice pack in place.  Hemovac in place. Motor Function - intact, moving foot and toes well on exam.  Cardiovascular- Regular rate and rhythm, no murmurs/rubs/gallops Respiratory- Lungs clear to auscultation bilaterally Gastrointestinal- active bowel sounds   Assessment/Plan: 1 Day Post-Op Procedure(s) (LRB): TOTAL HIP ARTHROPLASTY (Right) Active Problems:   H/O total hip arthroplasty  Estimated body mass index is 18.13 kg/m as calculated from the following:   Height as of 08/10/19: 4\' 7"  (1.397 m).   Weight as of 08/10/19:  35.4 kg. Advance diet Up with therapy Plan for discharge tomorrow    DVT Prophylaxis - Lovenox, Ted hose and foot pumps Weight-Bearing as tolerated to right leg  Cassell Smiles, PA-C [facility] 08/18/2019, 7:49 AM

## 2019-08-18 NOTE — TOC Initial Note (Signed)
Transition of Care [facility]) - Initial/Assessment Note    Patient Details  Name: Pam Cruz MRN: [id number] Date of Birth: 05-06-1935  Transition of [facility]) CM/SW Contact:    Su Hilt, RN Phone Number: 08/18/2019, 12:21 PM  Clinical Narrative:                 Met with the patient and her sister in the room at bedside The patient lives alone in an apartment, her sister is going to stay with her until Sunday.   She will need a RW youth size and a BSC youth size, I notified Brad with [employer] She would like to use [facility] as recommended by Dr Marry Guan I notified Helene Kelp with [facility] that the patient needs OT, PT and aide The price of Lovenox was provided to the patient and educated about how to use, she is going to work with the Bedside Nurse this evening and going forward to self administer  The price is 100$ She can afford her medications and is up to date with PCP She has transportation with Family and the patient still drives when able NO additional needs at this time   Expected Discharge Plan: [place] Barriers to Discharge: Continued Medical Work up   Patient Goals and CMS Choice Patient states their goals for this hospitalization and ongoing recovery are:: go home CMS Medicare.gov Compare Post Acute Care list provided to:: Patient Choice offered to / list presented to : Patient  Expected Discharge Plan and Services Expected Discharge Plan: [place]   Discharge Planning Services: CM Consult Post Acute Care Choice: [place] arrangements for the past 2 months: Apartment                 DME Arranged: 3-N-1, Walker youth DME Agency: [employer] Date DME Agency Contacted: 08/18/19 Time DME Agency Contacted: 1220 Representative spoke with at DME Agency: [place]: PT, OT, Nurse's Aide [place] Agency: [facility] (formerly [employer]) Date [place]: 08/18/19 Time [place]:  1220 Representative spoke with at [place] Arrangements/Services Living arrangements for the past 2 months: [place] with:: Self Patient language and need for interpreter reviewed:: Yes Do you feel safe going back to the place where you live?: Yes      Need for Family Participation in Patient Care: No (Comment) Care giver support system in place?: Yes (comment) Current home services: DME(grab bars) Criminal Activity/Legal Involvement Pertinent to Current Situation/Hospitalization: No - Comment as needed  Activities of Daily Living Home Assistive Devices/Equipment: None ADL Screening (condition at time of admission) Patient's cognitive ability adequate to safely complete daily activities?: Yes Is the patient deaf or have difficulty hearing?: No Does the patient have difficulty seeing, even when wearing glasses/contacts?: No Does the patient have difficulty concentrating, remembering, or making decisions?: No Patient able to express need for assistance with ADLs?: Yes Does the patient have difficulty dressing or bathing?: No Independently performs ADLs?: Yes (appropriate for developmental age) Does the patient have difficulty walking or climbing stairs?: Yes Weakness of Legs: Both Weakness of Arms/Hands: None  Permission Sought/Granted   Permission granted to share information with : Yes, Verbal Permission Granted              Emotional Assessment Appearance:: Appears stated age Attitude/Demeanor/Rapport: Engaged Affect (typically observed): Appropriate Orientation: : Oriented to Self, Oriented to Place, Oriented to  Time, Oriented to Situation  Alcohol / Substance Use: Not Applicable Psych Involvement: No (comment)  Admission diagnosis:  PRIMARY OSTEOARTHRITIS OF RIGHT HIP. Patient Active Problem List   Diagnosis Date Noted  . H/O total hip arthroplasty 08/17/2019  . Essential hypertension, benign 06/24/2018  . GERD (gastroesophageal reflux  disease) 12/10/2014  . History of IBS 12/10/2014   PCP:  Lavera Guise, MD Pharmacy:   [facility] [address] [place] [address] Phone: [phone] Fax: [place] #[id number] [place], [place] [address] [place] [address] Phone: [phone] Fax: [phone]     Social Determinants of Health (SDOH) Interventions    Readmission Risk Interventions No flowsheet data found.

## 2019-08-18 NOTE — Progress Notes (Signed)
Physical Therapy Treatment Patient Details Name: Pam Cruz MRN: [id number] DOB: [birth year] Today's Date: 08/18/2019    History of Present Illness Pt admitted R THR with posterior total hip precautions.    PT Comments    Pt is making good progress towards goals. Reviewed written HEP extensively. Answered multiple questions about home discharge by patient and sister. Pt forgot to call for pain meds and hadn't receiving since 0822, assisted pt in calling RN. RN came into room to deliver meds. Didn't appear to limit functional mobility. Still needs reminders for hip precautions. Will continue to progress as able, plan for stair training next date.   Follow Up Recommendations  Home health PT;Supervision for mobility/OOB     Equipment Recommendations  Rolling walker with 5" wheels(youth RW)    Recommendations for Other Services       Precautions / Restrictions Precautions Precautions: Fall;Posterior Hip Precaution Booklet Issued: Yes (comment) Precaution Comments: only able to recall 1/3 hip precautions Restrictions Weight Bearing Restrictions: Yes RLE Weight Bearing: Weight bearing as tolerated    Mobility  Bed Mobility Overal bed mobility: Needs Assistance Bed Mobility: Supine to Sit     Supine to sit: Min guard Sit to supine: Min guard   General bed mobility comments: safe technique. Needs assistance for repositioning  Transfers Overall transfer level: Needs assistance Equipment used: Rolling walker (2 wheeled) Transfers: Sit to/from Stand Sit to Stand: Min guard         General transfer comment: needs cues for sequencing. Able to demonstrate upright posture  Ambulation/Gait Ambulation/Gait assistance: Min guard Gait Distance (Feet): 220 Feet Assistive device: Rolling walker (2 wheeled) Gait Pattern/deviations: Step-through pattern     General Gait Details: ambulated lap around RN station and back to room and bathroom. Reciprocal gait pattern performed  with cues for hip precautions. Reports pain increases to 6/10.   Stairs             Wheelchair Mobility    Modified Rankin (Stroke Patients Only)       Balance Overall balance assessment: Needs assistance Sitting-balance support: Feet supported Sitting balance-Leahy Scale: Good     Standing balance support: Bilateral upper extremity supported Standing balance-Leahy Scale: Good                              Cognition Arousal/Alertness: Awake/alert Behavior During Therapy: WFL for tasks assessed/performed Overall Cognitive Status: Within Functional Limits for tasks assessed                                        Exercises Other Exercises Other Exercises: supine ther-ex performed on B LE including AP, glut sets, SAQ, quad sets, LAQ, and hip abd/add. All ther-ex performed x 12 reps with cga. Other Exercises: ambulated to bathroom with cga with supervision for hygiene. Safe technique Other Exercises: Extensive education given on pain management, car transfers, preparing for home discharge in addition to roles of HHA.    General Comments        Pertinent Vitals/Pain Pain Assessment: 0-10 Pain Score: 6  Pain Location: R hip Pain Descriptors / Indicators: Operative site guarding;Aching Pain Intervention(s): Limited activity within patient's tolerance    Home Living                      Prior Function  PT Goals (current goals can now be found in the care plan section) Acute Rehab PT Goals Patient Stated Goal: to go home PT Goal Formulation: With patient Time For Goal Achievement: 08/31/19 Potential to Achieve Goals: Good Progress towards PT goals: Progressing toward goals    Frequency    BID      PT Plan Current plan remains appropriate    Co-evaluation              AM-PAC PT "6 Clicks" Mobility   Outcome Measure  Help needed turning from your back to your side while in a flat bed without using  bedrails?: A Little Help needed moving from lying on your back to sitting on the side of a flat bed without using bedrails?: A Little Help needed moving to and from a bed to a chair (including a wheelchair)?: A Little Help needed standing up from a chair using your arms (e.g., wheelchair or bedside chair)?: A Little Help needed to walk in hospital room?: A Little Help needed climbing 3-5 steps with a railing? : A Lot 6 Click Score: 17    End of Session Equipment Utilized During Treatment: Gait belt Activity Tolerance: Patient tolerated treatment well Patient left: in bed;with bed alarm set Nurse Communication: Mobility status PT Visit Diagnosis: Muscle weakness (generalized) (M62.81);Difficulty in walking, not elsewhere classified (R26.2);Unsteadiness on feet (R26.81);Pain Pain - Right/Left: Right Pain - part of body: Hip     Time: [id number] PT Time Calculation (min) (ACUTE ONLY): 58 min  Charges:  $Gait Training: 23-37 mins $Therapeutic Exercise: 23-37 mins                     Greggory Stallion, PT, DPT [phone]    Kalene Cutler 08/18/2019, 3:32 PM

## 2019-08-18 NOTE — Progress Notes (Signed)
Physical Therapy Treatment Patient Details Name: Pam Cruz MRN: [id number] DOB: [birth year] Today's Date: 08/18/2019    History of Present Illness Pt admitted R THR with posterior total hip precautions.    PT Comments    Pt is making good progress towards goals with increased ambulation noted in hallway. Able to progress to reciprocal gait pattern. Discussed home discharge and encouraged pt to discuss with family for increased assistance at home after sister leaves on Sunday. Pt progressing very well regarding mobility, needs cues for hip precautions. Gave written HEP and discussed. Will continue to progress. Would benefit from youth RW.   Follow Up Recommendations  Home health PT;Supervision for mobility/OOB     Equipment Recommendations  Rolling walker with 5" wheels(youth RW )    Recommendations for Other Services       Precautions / Restrictions Precautions Precautions: Fall;Posterior Hip Precaution Booklet Issued: Yes (comment) Precaution Comments: only able to recall 1/3 hip precautions Restrictions Weight Bearing Restrictions: Yes RLE Weight Bearing: Weight bearing as tolerated    Mobility  Bed Mobility Overal bed mobility: Needs Assistance Bed Mobility: Sit to Supine     Supine to sit: Min guard Sit to supine: Min guard   General bed mobility comments: safe technique. Needs assistance for repositioning  Transfers Overall transfer level: Needs assistance Equipment used: Rolling walker (2 wheeled) Transfers: Sit to/from Stand Sit to Stand: Min guard         General transfer comment: able to recall to push from seated surface prior to standing. ONce upright, good WBing noted  Ambulation/Gait Ambulation/Gait assistance: Min guard Gait Distance (Feet): 130 Feet Assistive device: Rolling walker (2 wheeled) Gait Pattern/deviations: Step-through pattern     General Gait Details: ambulated in hallway with step to gait progressing to reciprocal gait  pattern. Upright posture noted, slight fatigue. Cues given to maintain hip precautions.   Stairs             Wheelchair Mobility    Modified Rankin (Stroke Patients Only)       Balance Overall balance assessment: Needs assistance Sitting-balance support: Feet supported Sitting balance-Leahy Scale: Good     Standing balance support: Bilateral upper extremity supported Standing balance-Leahy Scale: Good                              Cognition Arousal/Alertness: Awake/alert Behavior During Therapy: WFL for tasks assessed/performed Overall Cognitive Status: Within Functional Limits for tasks assessed                                 General Comments: Pt alert and oriented, follows commands w/ cues, poor recall and carryover of precautions      Exercises Other Exercises Other Exercises: seated ther-ex performed on B LE including AP, glut sets, SAQ, quad sets, and hip abd/add. All ther-ex performed x 12 reps with cga. Other Exercises: Pt instructed in posterior THPs and how to maintain during functional mobility and ADL tasks; AE/DME for ADL, home/routines modifications, falls prevention strategies, functional transfer training; handout provided to support recall and carryover    General Comments        Pertinent Vitals/Pain Pain Assessment: 0-10 Pain Score: 3  Pain Location: R hip Pain Descriptors / Indicators: Operative site guarding;Aching Pain Intervention(s): Limited activity within patient's tolerance;Ice applied    Home Living Family/patient expects to be discharged to:: Private residence Living Arrangements:  Alone Available Help at Discharge: Family(will have sister until Sunday) Type of Home: Apartment Home Access: Stairs to enter Entrance Stairs-Rails: None Home Layout: One level Home Equipment: None      Prior Function Level of Independence: Independent      Comments: driving and indep with ADLs   PT Goals (current  goals can now be found in the care plan section) Acute Rehab PT Goals Patient Stated Goal: to go home PT Goal Formulation: With patient Time For Goal Achievement: 08/31/19 Potential to Achieve Goals: Good Progress towards PT goals: Progressing toward goals    Frequency    BID      PT Plan Current plan remains appropriate    Co-evaluation              AM-PAC PT "6 Clicks" Mobility   Outcome Measure  Help needed turning from your back to your side while in a flat bed without using bedrails?: A Little Help needed moving from lying on your back to sitting on the side of a flat bed without using bedrails?: A Little Help needed moving to and from a bed to a chair (including a wheelchair)?: A Little Help needed standing up from a chair using your arms (e.g., wheelchair or bedside chair)?: A Little Help needed to walk in hospital room?: A Lot Help needed climbing 3-5 steps with a railing? : A Lot 6 Click Score: 16    End of Session Equipment Utilized During Treatment: Gait belt Activity Tolerance: Patient tolerated treatment well Patient left: in bed;with bed alarm set Nurse Communication: Mobility status PT Visit Diagnosis: Muscle weakness (generalized) (M62.81);Difficulty in walking, not elsewhere classified (R26.2);Unsteadiness on feet (R26.81);Pain Pain - Right/Left: Right Pain - part of body: Hip     Time: 0932-1010 PT Time Calculation (min) (ACUTE ONLY): 38 min  Charges:  $Gait Training: 23-37 mins $Therapeutic Exercise: 8-22 mins                     Greggory Stallion, PT, DPT [phone]    [name] 08/18/2019, 10:28 AM

## 2019-08-18 NOTE — Evaluation (Signed)
Occupational Therapy Evaluation Patient Details Name: Pam Cruz MRN: [id number] DOB: 19-Mar-1935 Today's Date: 08/18/2019    History of Present Illness Pt admitted R THR with posterior total hip precautions.   Clinical Impression   Pt seen for OT evaluation this date, POD#1 from above surgery. Pt was independent in all ADL and mobility prior to surgery. Pt is eager to return to PLOF with less pain and improved safety and independence. Pt currently requires minimal assist for LB dressing and bathing while in seated position due to pain and limited AROM of R hip, decreased BLE strength, and decreased problem solving and recall. Pt able to recall 0/3 posterior total hip precautions at start of session and unable to verbalize how to implement during ADL and mobility. Pt instructed in posterior total hip precautions and how to implement, self care skills, falls prevention strategies, home/routines modifications, DME/AE for LB bathing and dressing tasks, and compression stocking mgt strategies. Pt demo's limited recall of instruction provided and difficulty with problem solving how to maintain precautions. At end of session, pt able to recall 2/3 posterior total hip precautions with cues. Handout provided to support recall and carryover. Pt would benefit from additional instruction in self care skills and techniques to help maintain precautions with or without assistive devices to support recall and carryover prior to discharge. Recommend HHOT and 24/7 sup/assist upon discharge to maximize safety and adherence to posterior total hip precautions at home. RNCM notified.    Follow Up Recommendations  Home health OT;Supervision/Assistance - 24 hour    Equipment Recommendations  3 in 1 bedside commode;Other (comment)(reacher, LH sponge, LH shoe horn)    Recommendations for Other Services       Precautions / Restrictions Precautions Precautions: Fall;Posterior Hip Precaution Booklet Issued: Yes  (comment) Precaution Comments: Pt unable to recall any precautions at start of session, 2/3 at end - will benefit from additional instruction to support recall and carryover Restrictions Weight Bearing Restrictions: Yes RLE Weight Bearing: Weight bearing as tolerated      Mobility Bed Mobility Overal bed mobility: Needs Assistance Bed Mobility: Supine to Sit     Supine to sit: Min guard     General bed mobility comments: safe technique with ease of movement noted. Cues for hip precautions  Transfers Overall transfer level: Needs assistance Equipment used: Rolling walker (2 wheeled) Transfers: Sit to/from Stand Sit to Stand: Min guard         General transfer comment: cues for hip precautions and sequencing. Once standing, upright posture noted    Balance Overall balance assessment: Needs assistance   Sitting balance-Leahy Scale: Good     Standing balance support: Bilateral upper extremity supported Standing balance-Leahy Scale: Good                             ADL either performed or assessed with clinical judgement   ADL Overall ADL's : Needs assistance/impaired Eating/Feeding: Independent   Grooming: Sitting;Modified independent   Upper Body Bathing: Sitting;Set up;Supervision/ safety   Lower Body Bathing: Sit to/from stand;Minimal assistance;Cueing for safety   Upper Body Dressing : Sitting;Supervision/safety;Set up   Lower Body Dressing: Sit to/from stand;Minimal assistance;Cueing for safety   Toilet Transfer: Min guard;BSC;Ambulation;RW;Cueing for safety;Cueing for sequencing           Functional mobility during ADLs: Min guard;Cueing for safety;Rolling walker;Cueing for sequencing       Vision Patient Visual Report: No change from baseline  Perception     Praxis      Pertinent Vitals/Pain Pain Assessment: 0-10 Pain Score: 4  Pain Location: R hip Pain Descriptors / Indicators: Operative site guarding;Aching Pain  Intervention(s): Limited activity within patient's tolerance;Monitored during session;Repositioned;Ice applied     Hand Dominance Right   Extremity/Trunk Assessment Upper Extremity Assessment Upper Extremity Assessment: Overall WFL for tasks assessed   Lower Extremity Assessment Lower Extremity Assessment: Generalized weakness(R LE grossly 3/5; L LE grossly 4+/5)       Communication Communication Communication: No difficulties   Cognition Arousal/Alertness: Awake/alert Behavior During Therapy: WFL for tasks assessed/performed Overall Cognitive Status: No family/caregiver present to determine baseline cognitive functioning                                 General Comments: Pt alert and oriented, follows commands w/ cues, poor recall and carryover of precautions   General Comments       Exercises Other Exercises Other Exercises: Pt instructed in posterior THPs and how to maintain during functional mobility and ADL tasks; AE/DME for ADL, home/routines modifications, falls prevention strategies, functional transfer training; handout provided to support recall and carryover   Shoulder Instructions      Home Living Family/patient expects to be discharged to:: Private residence Living Arrangements: Alone Available Help at Discharge: Family(will have sister until Sunday) Type of Home: Apartment Home Access: Stairs to enter [employer] of Steps: 1 Entrance Stairs-Rails: None Home Layout: One level     Bathroom Shower/Tub: Teacher, early years/pre: Standard     Home Equipment: None          Prior Functioning/Environment Level of Independence: Independent        Comments: driving and indep with ADLs        OT Problem List: Decreased strength;Pain;Decreased safety awareness;Decreased knowledge of use of DME or AE;Decreased knowledge of precautions      OT Treatment/Interventions: Therapeutic exercise;Self-care/ADL  training;Therapeutic activities;Cognitive remediation/compensation;DME and/or AE instruction;Patient/family education;Balance training    OT Goals(Current goals can be found in the care plan section) Acute Rehab OT Goals Patient Stated Goal: to go home OT Goal Formulation: With patient Time For Goal Achievement: 09/01/19 Potential to Achieve Goals: Good ADL Goals Pt Will Perform Lower Body Dressing: with supervision;sit to/from stand;with adaptive equipment(maintaining posterior THPs) Pt Will Transfer to Toilet: with supervision;ambulating(maintaining posterior THPs, LRAD for amb, BSC over toilet) Additional ADL Goal #1: Pt will verbalize 100% of posterior total hip precautions Additional ADL Goal #2: Pt will independently instruct family/caregiver in compression stocking mgt  OT Frequency: Min 2X/week   Barriers to D/C:            Co-evaluation              AM-PAC OT "6 Clicks" Daily Activity     Outcome Measure Help from another person eating meals?: None Help from another person taking care of personal grooming?: None Help from another person toileting, which includes using toliet, bedpan, or urinal?: A Little Help from another person bathing (including washing, rinsing, drying)?: A Little Help from another person to put on and taking off regular upper body clothing?: None Help from another person to put on and taking off regular lower body clothing?: A Little 6 Click Score: 21   End of Session Equipment Utilized During Treatment: Gait belt;Rolling walker  Activity Tolerance: Patient tolerated treatment well Patient left: in chair;with call bell/phone within reach;with  chair alarm set;Other (comment)(pillows between BLE)  OT Visit Diagnosis: Other abnormalities of gait and mobility (R26.89);Pain Pain - Right/Left: Right Pain - part of body: Hip                Time: [id number] OT Time Calculation (min): 41 min Charges:  OT General Charges $OT Visit: 1 Visit OT  Evaluation $OT Eval Moderate Complexity: 1 Mod OT Treatments $Self Care/Home Management : 8-22 mins $Therapeutic Activity: 8-22 mins  Jeni Salles, MPH, MS, OTR/L ascom [phone] 08/18/19, 9:55 AM

## 2019-08-19 ENCOUNTER — Other Ambulatory Visit: Payer: Self-pay | Admitting: Internal Medicine

## 2019-08-19 ENCOUNTER — Telehealth: Payer: Self-pay

## 2019-08-19 DIAGNOSIS — Z23 Encounter for immunization: Secondary | ICD-10-CM | POA: Diagnosis not present

## 2019-08-19 MED ORDER — ENOXAPARIN SODIUM 40 MG/0.4ML ~~LOC~~ SOLN: 40.0000 mg | SUBCUTANEOUS | 0 refills | Status: DC

## 2019-08-19 MED ORDER — OXYCODONE HCL 5 MG PO TABS: 5.0000 mg | ORAL_TABLET | ORAL | 0 refills | Status: DC | PRN

## 2019-08-19 MED ORDER — CELECOXIB 200 MG PO CAPS: 200.0000 mg | ORAL_CAPSULE | Freq: Two times a day (BID) | ORAL | 0 refills | Status: AC

## 2019-08-19 NOTE — Progress Notes (Signed)
Physical Therapy Treatment Patient Details Name: Pam Cruz MRN: [id number] DOB: June 08, 1935 Today's Date: 08/19/2019    History of Present Illness Pt admitted R THR with posterior total hip precautions.    PT Comments    Pt has met all PT goals and is safe to discharge this date. Pt with good ambulation endurance and safe technique with mobility. Able to recall 3/3 hip precautions. Safe stair training performed.   Follow Up Recommendations  Home health PT;Supervision for mobility/OOB     Equipment Recommendations  Rolling walker with 5" wheels(youth)    Recommendations for Other Services       Precautions / Restrictions Precautions Precautions: Fall;Posterior Hip Precaution Booklet Issued: Yes (comment) Precaution Comments: able to recall 3/3 hip precautions Restrictions Weight Bearing Restrictions: Yes RLE Weight Bearing: Weight bearing as tolerated    Mobility  Bed Mobility Overal bed mobility: Needs Assistance Bed Mobility: Supine to Sit     Supine to sit: Min guard Sit to supine: Min guard   General bed mobility comments: Good recall of safety education from prior therapy sessions. Min VC for technqiue.  Transfers Overall transfer level: Needs assistance Equipment used: Rolling walker (2 wheeled) Transfers: Sit to/from Stand Sit to Stand: Supervision         General transfer comment: cues to push from seated surface  Ambulation/Gait Ambulation/Gait assistance: Supervision Gait Distance (Feet): 200 Feet Assistive device: Rolling walker (2 wheeled) Gait Pattern/deviations: Step-through pattern     General Gait Details: ambulated around RN station with reciprocal gait pattern and safe technique. Reports slight increase in pain with exertion.    Stairs Stairs: Yes Stairs assistance: Min guard Stair Management: One rail Left Number of Stairs: 4 General stair comments: ambulated up/down stairs with L rail. Educated on [place].   Wheelchair  Mobility    Modified Rankin (Stroke Patients Only)       Balance Overall balance assessment: Needs assistance Sitting-balance support: Feet supported;No upper extremity supported Sitting balance-Leahy Scale: Good Sitting balance - Comments: Steady sitting, reaching within BOS.   Standing balance support: Bilateral upper extremity supported Standing balance-Leahy Scale: Good                              Cognition Arousal/Alertness: Awake/alert Behavior During Therapy: WFL for tasks assessed/performed Overall Cognitive Status: Within Functional Limits for tasks assessed                                 General Comments: Pt intermittently closing eyes during session. Reports she had just taken pain medication and feels "drowsy". OT provides re-direction t/o session.      Exercises Other Exercises Other Exercises: supine ther-ex performed on B LE including AP, glut sets, SAQ, quad sets, LAQ, and hip abd/add. All ther-ex performed x 15 reps with cga. Other Exercises: Pt provided with reinforcement of prior education on posterior THPs and how to maintain during functional mobility and ADL tasks; AE/DME for ADL, home/routines modifications, falls prevention strategies, functional transfer training; Pt return demonstrates use of sock aid with min assist and VCs for technique.. Caregiver in room at end of session and verbalizes understanding of education provided as well.    General Comments        Pertinent Vitals/Pain Pain Assessment: 0-10 Pain Score: 5  Faces Pain Scale: Hurts little more Pain Location: R hip with mobility Pain Descriptors / Indicators:  Operative site guarding;Aching;Grimacing Pain Intervention(s): Limited activity within patient's tolerance;Premedicated before session    Home Living                      Prior Function            PT Goals (current goals can now be found in the care plan section) Acute Rehab PT  Goals Patient Stated Goal: to go home PT Goal Formulation: With patient Time For Goal Achievement: 08/31/19 Potential to Achieve Goals: Good Progress towards PT goals: Progressing toward goals    Frequency    BID      PT Plan Current plan remains appropriate    Co-evaluation              AM-PAC PT "6 Clicks" Mobility   Outcome Measure  Help needed turning from your back to your side while in a flat bed without using bedrails?: None Help needed moving from lying on your back to sitting on the side of a flat bed without using bedrails?: None Help needed moving to and from a bed to a chair (including a wheelchair)?: None Help needed standing up from a chair using your arms (e.g., wheelchair or bedside chair)?: None Help needed to walk in hospital room?: None Help needed climbing 3-5 steps with a railing? : A Little 6 Click Score: 23    End of Session Equipment Utilized During Treatment: Gait belt Activity Tolerance: Patient tolerated treatment well Patient left: in bed;with bed alarm set Nurse Communication: Mobility status PT Visit Diagnosis: Muscle weakness (generalized) (M62.81);Difficulty in walking, not elsewhere classified (R26.2);Unsteadiness on feet (R26.81);Pain Pain - Right/Left: Right Pain - part of body: Hip     Time: 2811-8867 PT Time Calculation (min) (ACUTE ONLY): 27 min  Charges:  $Gait Training: 8-22 mins $Therapeutic Exercise: 8-22 mins                     Greggory Stallion, PT, DPT [phone]    Marisella Puccio 08/19/2019, 1:20 PM

## 2019-08-19 NOTE — Telephone Encounter (Signed)
Pt called that hospital gave here celebrex she want want to know is ok to take it as per dr Humphrey Rolls is ok to take as needed and we can see tomorrow for virtual visit

## 2019-08-19 NOTE — Progress Notes (Signed)
D: Pt alert and oriented. Lovenox education given this morning and again prior to discharge.    A: Pt and sister received discharge and medication education/information. Pt belongings were gathered at this time to include BSC, incentive spirometer, 2 honeycomb dressings and a walker.   R: Pt and sister verbalized understanding of discharge and medication education/information.  Pt escorted to [facility] mall lobby via wheelchair by staff where sister picked her up.

## 2019-08-19 NOTE — Progress Notes (Signed)
Occupational Therapy Treatment Patient Details Name: Pam Cruz MRN: [id number] DOB: Sep 04, 1935 Today's Date: 08/19/2019    History of present illness Pt admitted R THR with posterior total hip precautions.   OT comments  Pam Cruz was seen for OT treatment on this date. Upon arrival to room pt awake/alert semi-supine in bed reporting minimal pain and agreeable to OT tx. Pt provided with reinforcement of prior education in posterior total hip precautions and how to implement, self care skills, falls prevention strategies, home/routines modifications, DME/AE for LB bathing and dressing tasks, & compression stocking mgt strategies. At start of session pt able to recall 1/3 hip precautions with limited understanding of how to implement during ADL tasks. At end of session, pt able to independently recall 3/3 posterior total hip precautions and return demonstrates safe transfer strategies and use of AE to don socks with min VCs for technique this date. Pt making good progress toward goals and continues to benefit from skilled OT services to maximize return to PLOF and minimize risk of future falls, injury, caregiver burden, and readmission. Will continue to follow POC. Discharge recommendation remains appropriate.     Follow Up Recommendations  Home health OT;Supervision - Intermittent    Equipment Recommendations  3 in 1 bedside commode;Other (comment)    Recommendations for Other Services      Precautions / Restrictions Precautions Precautions: Fall;Posterior Hip Precaution Booklet Issued: Yes (comment) Precaution Comments: able to recall 3/3 hip precautions Restrictions Weight Bearing Restrictions: Yes RLE Weight Bearing: Weight bearing as tolerated       Mobility Bed Mobility Overal bed mobility: Needs Assistance(Simultaneous filing. User may not have seen previous data.) Bed Mobility: Supine to Sit(Simultaneous filing. User may not have seen previous data.)     Supine to  sit: Min guard Sit to supine: Min guard(Simultaneous filing. User may not have seen previous data.)   General bed mobility comments: Good recall of safety education from prior therapy sessions. Min VC for technqiue.  Transfers Overall transfer level: Needs assistance Equipment used: Rolling walker (2 wheeled) Transfers: Sit to/from Stand Sit to Stand: Min guard         General transfer comment: Able to demonstrate upright posture with good adherence to posterior THPs.    Balance Overall balance assessment: Needs assistance Sitting-balance support: Feet supported;No upper extremity supported Sitting balance-Leahy Scale: Good Sitting balance - Comments: Steady sitting, reaching within BOS.   Standing balance support: Bilateral upper extremity supported Standing balance-Leahy Scale: Good                             ADL either performed or assessed with clinical judgement   ADL Overall ADL's : Needs assistance/impaired                 Upper Body Dressing : Sitting;Supervision/safety;Set up   Lower Body Dressing: Sit to/from stand;Minimal assistance;Cueing for safety   Toilet Transfer: Min guard;BSC;Ambulation;RW;Cueing for safety;Cueing for sequencing   Toileting- Clothing Manipulation and Hygiene: Sit to/from stand;Cueing for safety       Functional mobility during ADLs: Min guard;Cueing for safety;Rolling walker;Cueing for sequencing General ADL Comments: Pt requires consistent cueing for safety during functional mobility and LB ADL mgt this date. She is able to return verbalize understanding of education provided on posterior hip precautions with independent recall of 3/3 THPs at end of session.     Vision Patient Visual Report: No change from baseline  Perception     Praxis      Cognition Arousal/Alertness: Awake/alert Behavior During Therapy: WFL for tasks assessed/performed Overall Cognitive Status: Within Functional Limits for tasks  assessed                                 General Comments: Pt intermittently closing eyes during session. Reports she had just taken pain medication and feels "drowsy". OT provides re-direction t/o session.        Exercises Other Exercises Other Exercises: Pt provided with reinforcement of prior education on posterior THPs and how to maintain during functional mobility and ADL tasks; AE/DME for ADL, home/routines modifications, falls prevention strategies, functional transfer training; Pt return demonstrates use of sock aid with min assist and VCs for technique.. Caregiver in room at end of session and verbalizes understanding of education provided as well.   Shoulder Instructions       General Comments      Pertinent Vitals/ Pain       Pain Assessment: 0-10 Pain Score: 5  Faces Pain Scale: Hurts little more Pain Location: R hip with mobility Pain Descriptors / Indicators: Operative site guarding;Aching;Grimacing Pain Intervention(s): Limited activity within patient's tolerance;Premedicated before session  Home Living                                          Prior Functioning/Environment              Frequency  Min 2X/week        Progress Toward Goals  OT Goals(current goals can now be found in the care plan section)  Progress towards OT goals: Progressing toward goals  Acute Rehab OT Goals Patient Stated Goal: to go home OT Goal Formulation: With patient Time For Goal Achievement: 09/01/19 Potential to Achieve Goals: Good  Plan Discharge plan remains appropriate;Frequency remains appropriate    Co-evaluation                 AM-PAC OT "6 Clicks" Daily Activity     Outcome Measure   Help from another person eating meals?: None Help from another person taking care of personal grooming?: A Little Help from another person toileting, which includes using toliet, bedpan, or urinal?: A Little Help from another person bathing  (including washing, rinsing, drying)?: A Little Help from another person to put on and taking off regular upper body clothing?: None Help from another person to put on and taking off regular lower body clothing?: A Little 6 Click Score: 20    End of Session Equipment Utilized During Treatment: Gait belt;Rolling walker  OT Visit Diagnosis: Other abnormalities of gait and mobility (R26.89);Pain Pain - Right/Left: Right Pain - part of body: Hip   Activity Tolerance Patient tolerated treatment well   Patient Left in chair;with call bell/phone within reach;with chair alarm set;with family/visitor present   Nurse Communication          Time: [id number] OT Time Calculation (min): 45 min  Charges: OT General Charges $OT Visit: 1 Visit OT Treatments $Self Care/Home Management : 38-52 mins  Shara Blazing, M.S., OTR/L Ascom: [phone] 08/19/19, 11:47 AM

## 2019-08-19 NOTE — Discharge Summary (Signed)
Physician Discharge Summary  Patient ID: Pam Cruz MRN: [id number] DOB/AGE: 12/31/1934 84 y.o.  Admit date: 08/17/2019 Discharge date: 08/19/2019  Admission Diagnoses:  PRIMARY OSTEOARTHRITIS OF RIGHT HIP.  Surgeries:   Right total hip arthroplasty  SURGEON:  Marciano Sequin. M.D.  ASSISTANT: Cassell Smiles, PA-C (present and scrubbed throughout the case, critical for assistance with exposure, retraction, instrumentation, and closure)  ANESTHESIA: spinal  ESTIMATED BLOOD LOSS: 75 mL  FLUIDS REPLACED: 925 mL of crystalloid  DRAINS: 2 medium drains to a Hemovac reservoir  IMPLANTS UTILIZED: DePuy 15 mm small stature AML femoral stem, 56 mm OD [employer] acetabular component, 6.5 x 20 mm cancellous screw, +4 mm neutral [employer] polyethylene insert, and a 36 mm M-SPEC-2 mm hip ball  Discharge Diagnoses: Patient Active Problem List   Diagnosis Date Noted  . H/O total hip arthroplasty 08/17/2019  . Essential hypertension, benign 06/24/2018  . GERD (gastroesophageal reflux disease) 12/10/2014  . History of IBS 12/10/2014    Past Medical History:  Diagnosis Date  . Anxiety   . Arthritis   . COPD (chronic obstructive pulmonary disease) ([place])   . Dyspnea   . Gastritis   . GERD (gastroesophageal reflux disease)   . Hypertension   . Osteoporosis      Transfusion: n/a   Consultants (if any):   Discharged Condition: Improved  Hospital Course: DOLOREZ FRAGALE is an 84 y.o. female who was admitted 08/17/2019 with a diagnosis of right hip osteoarthritis and went to the operating room on 08/17/2019 and underwent right total hip arthroplasty. The patient received perioperative antibiotics for prophylaxis (see below). The patient tolerated the procedure well and was transported to PACU in stable condition. After meeting PACU criteria, the patient was subsequently transferred to the Orthopaedics/Rehabilitation unit.   The patient received DVT  prophylaxis in the form of early mobilization, Lovenox, Foot Pumps and TED hose. A sacral pad had been placed and heels were elevated off of the bed with rolled towels in order to protect skin integrity. Foley catheter was discontinued on postoperative day #1. Wound drains were discontinued on postoperative day #2. The surgical incision was healing well without signs of infection.  Physical therapy was initiated postoperatively for transfers, gait training, and strengthening. Occupational therapy was initiated for activities of daily living and evaluation for assisted devices. Rehabilitation goals were reviewed in detail with the patient. The patient made steady progress with physical therapy and physical therapy recommended discharge to Home.   The patient achieved his preliminary goals of this hospitalization and was felt to be medically and orthopaedically appropriate for discharge.  She was given perioperative antibiotics:  Anti-infectives (From admission, onward)   Start     Dose/Rate Route Frequency Ordered Stop   08/17/19 1400  clindamycin (CLEOCIN) IVPB 600 mg     600 mg 100 mL/hr over 30 Minutes Intravenous Every 6 hours 08/17/19 1200 08/18/19 1359   08/17/19 0629  clindamycin (CLEOCIN) 900 MG/50ML IVPB    Note to Pharmacy: Dewayne Hatch   : cabinet override      08/17/19 0629 08/17/19 0746   08/17/19 0600  clindamycin (CLEOCIN) IVPB 900 mg     900 mg 100 mL/hr over 30 Minutes Intravenous On call to O.R. 08/17/19 0003 08/17/19 0758    .  Recent vital signs:  Vitals:   08/18/19 2300 08/19/19 0801  BP: 133/77 121/61  Pulse: 65 62  Resp: 19 16  Temp: 98.7 F (37.1 C) 97.7 F (36.5 C)  SpO2: 98% 94%    Recent laboratory studies:  No results for input(s): WBC, HGB, HCT, PLT, K, CL, CO2, BUN, CREATININE, GLUCOSE, CALCIUM, LABPT, INR in the last 72 hours.  Diagnostic Studies: Dg Hip Unilat With Pelvis 2-3 Views Right  Result Date: 08/17/2019 CLINICAL DATA:  84 year old  female status post right total hip arthroplasty. EXAM: DG HIP (WITH OR WITHOUT PELVIS) 2-3V RIGHT COMPARISON:  CT Abdomen and Pelvis 08/17/2005. FINDINGS: Portable AP and cross-table lateral views of the pelvis and right hip at 1110 hours. Right total hip arthroplasty hardware in place, appears intact with satisfactory alignment. Overlying skin staples. No unexpected osseous changes identified. Extensive Aortoiliac calcified atherosclerosis. Calcified femoral artery atherosclerosis. IMPRESSION: 1. Right total hip arthroplasty with no adverse features identified. 2.  Aortic Atherosclerosis (ICD10-I70.0). Electronically Signed   By: Genevie Ann M.D.   On: 08/17/2019 11:35    Discharge Medications:   Allergies as of 08/19/2019      Reactions   Ambien [zolpidem Tartrate]    Pt unsure    Amoxicillin Nausea And Vomiting   Codeine    Other reaction(s): Abdominal Pain   Erythromycin Nausea And Vomiting   Penicillins Nausea And Vomiting   Reglan [metoclopramide] Nausea And Vomiting   Sulfa Antibiotics Nausea And Vomiting      Medication List    TAKE these medications   acetaminophen 500 MG tablet Commonly known as: TYLENOL Take 500 mg by mouth every 6 (six) hours as needed (pain).   amLODipine 5 MG tablet Commonly known as: NORVASC TAKE 1 AND 1/2 TABLETS BY  MOUTH EVERY DAY FOR  HYPERTENSION What changed:   how much to take  how to take this  when to take this   celecoxib 200 MG capsule Commonly known as: CELEBREX Take 1 capsule (200 mg total) by mouth 2 (two) times daily.   clindamycin 150 MG capsule Commonly known as: CLEOCIN Take 150 mg by mouth 3 (three) times daily.   clonazePAM 0.5 MG tablet Commonly known as: KLONOPIN TAKE 1 TABLET BY MOUTH  TWICE DAILY AS NEEDED FOR  ANXIETY What changed:   how much to take  how to take this  when to take this  additional instructions   desipramine 25 MG tablet Commonly known as: NORPRAMIN TAKE 1 TABLET BY MOUTH  DAILY What  changed:   when to take this  reasons to take this   enoxaparin 40 MG/0.4ML injection Commonly known as: LOVENOX Inject 0.4 mLs (40 mg total) into the skin daily for 14 days.   fluticasone 50 MCG/ACT nasal spray Commonly known as: FLONASE USE 2 SPRAYS IN EACH  NOSTRIL EVERY NIGHT AT  BEDTIME What changed: See the new instructions.   hydrALAZINE 50 MG tablet Commonly known as: APRESOLINE Take 0.5 tablets (25 mg total) by mouth 3 (three) times daily. What changed: See the new instructions.   levothyroxine 50 MCG tablet Commonly known as: SYNTHROID TAKE ONE-HALF TABLET BY  MOUTH ONCE A DAY ON AN  EMPTY STOMACH What changed:   how much to take  how to take this  when to take this   losartan 50 MG tablet Commonly known as: COZAAR TAKE 1 TABLET BY MOUTH  DAILY   montelukast 10 MG tablet Commonly known as: SINGULAIR TAKE 1 TABLET BY MOUTH  DAILY   omeprazole 20 MG capsule Commonly known as: PRILOSEC Take 20 mg by mouth 2 (two) times daily before a meal.   oxyCODONE 5 MG immediate release tablet Commonly known as:  Oxy IR/ROXICODONE Take 1 tablet (5 mg total) by mouth every 4 (four) hours as needed for moderate pain (pain score 4-6).   PreserVision AREDS 2+Multi Vit Caps Take 1 capsule by mouth 2 (two) times daily.   sennosides-docusate sodium 8.6-50 MG tablet Commonly known as: SENOKOT-S Take 1-2 tablets by mouth at bedtime.   terbinafine 1 % cream Commonly known as: LAMISIL Apply 1 application topically 2 (two) times daily as needed (toes).   Wixela Inhub 250-50 MCG/DOSE Aepb Generic drug: Fluticasone-Salmeterol USE 1 PUFF TWICE DAILY What changed: See the new instructions.            Durable Medical Equipment  (From admission, onward)         Start     Ordered   08/17/19 1201  DME Walker rolling  Once    Question:  Patient needs a walker to treat with the following condition  Answer:  S/P total hip arthroplasty   08/17/19 1200   08/17/19 1201   DME Bedside commode  Once    Question:  Patient needs a bedside commode to treat with the following condition  Answer:  S/P total hip arthroplasty   08/17/19 1200          Disposition: Home with home health therapy    Follow-up Information    Dereck Leep, MD On 09/29/2019.   Specialty: Orthopedic Surgery Why: at 1:30pm Contact information: [place] [address] [place], PA-C 08/19/2019, 11:49 AM

## 2019-08-19 NOTE — TOC Transition Note (Signed)
Transition of Care [facility]) - CM/SW Discharge Note   Patient Details  Name: Pam Cruz MRN: [id number] Date of Birth: 1935-05-29  Transition of Care [facility]) CM/SW Contact:  Su Hilt, RN Phone Number: 08/19/2019, 11:47 AM   Clinical Narrative:    Patient to discharge home with her sister She has [place] set up with [facility] for PT, OT and Aide, she has a youth size RW And BSC, Lovenox price given  The patient has transportation with her sister   Final next level of care: Home w Home Health Services Barriers to Discharge: Barriers Resolved   Patient Goals and CMS Choice Patient states their goals for this hospitalization and ongoing recovery are:: go home CMS Medicare.gov Compare Post Acute Care list provided to:: Patient Choice offered to / list presented to : Patient  Discharge Placement                       Discharge Plan and Services   Discharge Planning Services: CM Consult Post Acute Care Choice: Home Health          DME Arranged: 3-N-1, Walker youth DME Agency: [employer] Date DME Agency Contacted: 08/18/19 Time DME Agency Contacted: 1220 Representative spoke with at DME Agency: [place]: PT, OT, Nurse's Aide [place] Agency: [facility] (formerly [employer]) Date [place]: 08/18/19 Time [place]: 1220 Representative spoke with at [place]: [place] ([place]) Interventions     Readmission Risk Interventions No flowsheet data found.

## 2019-08-19 NOTE — Progress Notes (Addendum)
  Subjective: 2 Days Post-Op Procedure(s) (LRB): TOTAL HIP ARTHROPLASTY (Right) Patient reports pain as well-controlled.   Patient is well, and has had no acute complaints or problems Plan is to go Home after hospital stay. Negative for chest pain and shortness of breath Fever: no Gastrointestinal: negative for nausea and vomiting.  Patient has had a bowel movement.  Objective: Vital signs in last 24 hours: Temp:  [97.7 F (36.5 C)-98.7 F (37.1 C)] 97.7 F (36.5 C) (11/04 0801) Pulse Rate:  [62-65] 62 (11/04 0801) Resp:  [16-19] 16 (11/04 0801) BP: (121-133)/(61-77) 121/61 (11/04 0801) SpO2:  [94 %-98 %] 94 % (11/04 0801) Weight:  [35.3 kg] 35.3 kg (11/03 1547)  Intake/Output from previous day:  Intake/Output Summary (Last 24 hours) at 08/19/2019 0832 Last data filed at 08/19/2019 0208 Gross per 24 hour  Intake 480 ml  Output 110 ml  Net 370 ml    Intake/Output this shift: No intake/output data recorded.  Labs: No results for input(s): HGB in the last 72 hours. No results for input(s): WBC, RBC, HCT, PLT in the last 72 hours. No results for input(s): NA, K, CL, CO2, BUN, CREATININE, GLUCOSE, CALCIUM in the last 72 hours. No results for input(s): LABPT, INR in the last 72 hours.   EXAM General - Patient is Alert, Appropriate and Oriented Extremity - Neurovascular intact Compartment soft Dressing/Incision - clean, no drainage, postoperative dressing remains in place.  Hemovac remains in place. Ice pack in place. Motor Function - intact, moving foot and toes well on exam. Dorsiflexion, plantar flexion intact  Cardiovascular- Regular rate and rhythm, no murmurs/rubs/gallops Respiratory- Lungs clear to auscultation bilaterally Gastrointestinal- soft, nontender and hypoactive bowel sounds   Assessment/Plan: 2 Days Post-Op Procedure(s) (LRB): TOTAL HIP ARTHROPLASTY (Right) Active Problems:   H/O total hip arthroplasty  Estimated body mass index is 18.09 kg/m as  calculated from the following:   Height as of this encounter: 4\' 7"  (1.397 m).   Weight as of this encounter: 35.3 kg. Advance diet Up with therapy Discharge home with home health  Hemovac removed.  Honeycomb dressing replaced.  DVT Prophylaxis - Lovenox, Ted hose and foot pumps Weight-Bearing as tolerated to right leg  Cassell Smiles, PA-C [facility] Orthopaedic Surgery 08/19/2019, 8:32 AM

## 2019-08-20 ENCOUNTER — Other Ambulatory Visit: Payer: Self-pay

## 2019-08-20 ENCOUNTER — Ambulatory Visit (INDEPENDENT_AMBULATORY_CARE_PROVIDER_SITE_OTHER): Payer: Medicare Other | Admitting: Internal Medicine

## 2019-08-20 ENCOUNTER — Encounter: Payer: Self-pay | Admitting: Internal Medicine

## 2019-08-20 ENCOUNTER — Telehealth: Payer: Self-pay | Admitting: Internal Medicine

## 2019-08-20 DIAGNOSIS — I10 Essential (primary) hypertension: Secondary | ICD-10-CM | POA: Diagnosis not present

## 2019-08-20 DIAGNOSIS — Z7951 Long term (current) use of inhaled steroids: Secondary | ICD-10-CM | POA: Diagnosis not present

## 2019-08-20 DIAGNOSIS — I700 Atherosclerosis of aorta: Secondary | ICD-10-CM | POA: Diagnosis not present

## 2019-08-20 DIAGNOSIS — M810 Age-related osteoporosis without current pathological fracture: Secondary | ICD-10-CM | POA: Diagnosis not present

## 2019-08-20 DIAGNOSIS — J449 Chronic obstructive pulmonary disease, unspecified: Secondary | ICD-10-CM | POA: Diagnosis not present

## 2019-08-20 DIAGNOSIS — Z96641 Presence of right artificial hip joint: Secondary | ICD-10-CM | POA: Diagnosis not present

## 2019-08-20 DIAGNOSIS — Z9181 History of falling: Secondary | ICD-10-CM | POA: Diagnosis not present

## 2019-08-20 DIAGNOSIS — K219 Gastro-esophageal reflux disease without esophagitis: Secondary | ICD-10-CM | POA: Diagnosis not present

## 2019-08-20 DIAGNOSIS — Z471 Aftercare following joint replacement surgery: Secondary | ICD-10-CM | POA: Diagnosis not present

## 2019-08-20 NOTE — Telephone Encounter (Signed)
PER DR [name] Pt has a broken hip and once her family has gone back home pt may need some assistance from our staff, if pt calls please give call to [facility] and Dr Humphrey Rolls or take message and we will call her back. beth

## 2019-08-20 NOTE — Telephone Encounter (Signed)
Pt had appt with dfk today

## 2019-08-20 NOTE — Progress Notes (Signed)
[facility] [place], [place] [address]  Internal MEDICINE  Telephone Visit  Patient Name: Pam Cruz  [id number]  [id number]  Date of Service: 08/20/2019                                                          Transitional care management  I connected with the patient at [phone] by telephone and verified the patients identity using two identifiers.   I discussed the limitations, risks, security and privacy concerns of performing an evaluation and management service by telephone and the availability of in person appointments. I also discussed with the patient that there may be a patient responsible charge related to the service.  The patient expressed understanding and agrees to proceed.    Chief Complaint  Patient presents with  . Telephone Screen  . Telephone Assessment    surgical follow up , right hip replacement on Monday , taken pain medications as needed using a walker ,, has [facility] .    HPI Pt was admitted for right hip arthroplasty for ongoing hip pain due to degenerative arthrosis of right hip. Pt has multiple medical problems and will like to make sure her medications are reviewed and continued  She is worried about Celebrex. She has developed hyponatremia in the past due to side effects  for diuretics and since then she is scared to take any new meds   Current Medication: Outpatient Encounter Medications as of 08/20/2019  Medication Sig  . acetaminophen (TYLENOL) 500 MG tablet Take 500 mg by mouth every 6 (six) hours as needed (pain).   Marland Kitchen amLODipine (NORVASC) 5 MG tablet TAKE 1 AND 1/2 TABLETS BY  MOUTH EVERY DAY FOR  HYPERTENSION (Patient taking differently: Take 7.5 mg by mouth daily. TAKE 1 AND 1/2 TABLETS BY  MOUTH EVERY DAY FOR  HYPERTENSION)  . celecoxib (CELEBREX) 200 MG capsule Take 1 capsule (200 mg total) by mouth 2 (two) times daily.  . clindamycin (CLEOCIN) 150 MG capsule Take 150 mg by mouth 3 (three)  times daily.  . clonazePAM (KLONOPIN) 0.5 MG tablet TAKE 1 TABLET BY MOUTH  TWICE DAILY AS NEEDED FOR  ANXIETY (Patient taking differently: Take 0.75 mg by mouth at bedtime. 1.5 tabs at bedtime)  . desipramine (NORPRAMIN) 25 MG tablet TAKE 1 TABLET BY MOUTH  DAILY (Patient taking differently: Take 25 mg by mouth daily as needed (stomach issues). )  . enoxaparin (LOVENOX) 40 MG/0.4ML injection Inject 0.4 mLs (40 mg total) into the skin daily for 14 days.  . fluticasone (FLONASE) 50 MCG/ACT nasal spray USE 2 SPRAYS IN EACH  NOSTRIL EVERY NIGHT AT  BEDTIME (Patient taking differently: Place 2 sprays into both nostrils at bedtime. )  . hydrALAZINE (APRESOLINE) 50 MG tablet Take 0.5 tablets (25 mg total) by mouth 3 (three) times daily.  Marland Kitchen levothyroxine (SYNTHROID, LEVOTHROID) 50 MCG tablet TAKE ONE-HALF TABLET BY  MOUTH ONCE A DAY ON AN  EMPTY STOMACH (Patient taking differently: Take 25 mcg by mouth daily before breakfast. TAKE ONE-HALF TABLET BY  MOUTH ONCE A DAY ON AN  EMPTY STOMACH)  . losartan (COZAAR) 50 MG tablet TAKE 1 TABLET BY MOUTH  DAILY  . montelukast (SINGULAIR) 10 MG tablet TAKE 1 TABLET BY MOUTH  DAILY  .  Multiple Vitamins-Minerals (PRESERVISION AREDS 2+MULTI VIT) CAPS Take 1 capsule by mouth 2 (two) times daily.   Marland Kitchen omeprazole (PRILOSEC) 20 MG capsule Take 20 mg by mouth 2 (two) times daily before a meal.   . oxyCODONE (OXY IR/ROXICODONE) 5 MG immediate release tablet Take 1 tablet (5 mg total) by mouth every 4 (four) hours as needed for moderate pain (pain score 4-6).  Marland Kitchen sennosides-docusate sodium (SENOKOT-S) 8.6-50 MG tablet Take 1-2 tablets by mouth at bedtime.   . terbinafine (LAMISIL) 1 % cream Apply 1 application topically 2 (two) times daily as needed (toes).   Grant Ruts INHUB 250-50 MCG/DOSE AEPB USE 1 PUFF TWICE DAILY (Patient taking differently: Inhale 1 puff into the lungs 2 (two) times daily. )   No facility-administered encounter medications on file as of 08/20/2019.      Surgical History: Past Surgical History:  Procedure Laterality Date  . COLONOSCOPY    . cysto    . LAPAROSCOPY    . TOTAL HIP ARTHROPLASTY Right 08/17/2019   Procedure: TOTAL HIP ARTHROPLASTY;  Surgeon: Dereck Leep, MD;  Location: [facility] ORS;  Service: Orthopedics;  Laterality: Right;    Medical History: Past Medical History:  Diagnosis Date  . Anxiety   . Arthritis   . COPD (chronic obstructive pulmonary disease) ([place])   . Dyspnea   . Gastritis   . GERD (gastroesophageal reflux disease)   . Hypertension   . Osteoporosis     Family History: Family History  Problem Relation Age of Onset  . Hypertension Mother   . Coronary artery disease Father   . Heart disease Father     Social History   Socioeconomic History  . Marital status: Single    Spouse name: Not on file  . Number of children: Not on file  . Years of education: Not on file  . Highest education level: Not on file  Occupational History  . Not on file  Social Needs  . Financial resource strain: Not on file  . Food insecurity    Worry: Not on file    Inability: Not on file  . Transportation needs    Medical: Not on file    Non-medical: Not on file  Tobacco Use  . Smoking status: Former Smoker    Quit date: 02/20/2001    Years since quitting: 18.5  . Smokeless tobacco: Never Used  . Tobacco comment: quit 17 years ago  Substance and Sexual Activity  . Alcohol use: Yes    Alcohol/week: 2.0 standard drinks    Types: 2 Glasses of wine per week    Comment: very rarely  . Drug use: Never  . Sexual activity: Not on file  Lifestyle  . Physical activity    Days per week: Not on file    Minutes per session: Not on file  . Stress: Not on file  Relationships  . Social Herbalist on phone: Not on file    Gets together: Not on file    Attends religious service: Not on file    Active member of club or organization: Not on file    Attends meetings of clubs or organizations: Not on file     Relationship status: Not on file  . Intimate partner violence    Fear of current or ex partner: Not on file    Emotionally abused: Not on file    Physically abused: Not on file    Forced sexual activity: Not on file  Other  Topics Concern  . Not on file  Social History Narrative  . Not on file   Review of Systems  Constitutional: Negative for chills, diaphoresis and fatigue.  HENT: Negative for ear pain, postnasal drip and sinus pressure.   Eyes: Negative for photophobia, discharge, redness, itching and visual disturbance.  Respiratory: Negative for cough, shortness of breath and wheezing.   Cardiovascular: Negative for chest pain, palpitations and leg swelling.  Gastrointestinal: Negative for abdominal pain, constipation, diarrhea, nausea and vomiting.  Genitourinary: Negative for dysuria and flank pain.  Musculoskeletal: Positive for arthralgias and gait problem. Negative for back pain and neck pain.  Skin: Negative for color change.  Allergic/Immunologic: Negative for environmental allergies and food allergies.  Neurological: Negative for dizziness and headaches.  Hematological: Does not bruise/bleed easily.  Psychiatric/Behavioral: Negative for agitation, behavioral problems (depression) and hallucinations.    Vital Signs: There were no vitals taken for this visit.   Observation/Objective: She is very pleasant ans was at home with her cousin, she will get PT  Assessment/Plan: 1. History of total right hip replacement - Pt is ok to take Celebrex for brief duration, continue with PT and wt bearing if indicated   2. Essential hypertension, benign - Continue to monitor at home   General Counseling: marily dethlefsen understanding of the findings of today's phone visit and agrees with plan of treatment. I have discussed any further diagnostic evaluation that may be needed or ordered today. We also reviewed her medications today. she has been encouraged to call the office with  any questions or concerns that should arise related to todays visit.   Time spent: 78 Minutes    Dr Lavera Guise Internal medicine

## 2019-08-21 DIAGNOSIS — Z96641 Presence of right artificial hip joint: Secondary | ICD-10-CM | POA: Diagnosis not present

## 2019-08-21 DIAGNOSIS — I10 Essential (primary) hypertension: Secondary | ICD-10-CM | POA: Diagnosis not present

## 2019-08-21 DIAGNOSIS — I700 Atherosclerosis of aorta: Secondary | ICD-10-CM | POA: Diagnosis not present

## 2019-08-21 DIAGNOSIS — Z471 Aftercare following joint replacement surgery: Secondary | ICD-10-CM | POA: Diagnosis not present

## 2019-08-21 DIAGNOSIS — J449 Chronic obstructive pulmonary disease, unspecified: Secondary | ICD-10-CM | POA: Diagnosis not present

## 2019-08-21 DIAGNOSIS — K219 Gastro-esophageal reflux disease without esophagitis: Secondary | ICD-10-CM | POA: Diagnosis not present

## 2019-08-21 DIAGNOSIS — M810 Age-related osteoporosis without current pathological fracture: Secondary | ICD-10-CM | POA: Diagnosis not present

## 2019-08-21 DIAGNOSIS — Z7951 Long term (current) use of inhaled steroids: Secondary | ICD-10-CM | POA: Diagnosis not present

## 2019-08-21 DIAGNOSIS — Z9181 History of falling: Secondary | ICD-10-CM | POA: Diagnosis not present

## 2019-08-25 DIAGNOSIS — Z9181 History of falling: Secondary | ICD-10-CM | POA: Diagnosis not present

## 2019-08-25 DIAGNOSIS — K219 Gastro-esophageal reflux disease without esophagitis: Secondary | ICD-10-CM | POA: Diagnosis not present

## 2019-08-25 DIAGNOSIS — Z7951 Long term (current) use of inhaled steroids: Secondary | ICD-10-CM | POA: Diagnosis not present

## 2019-08-25 DIAGNOSIS — J449 Chronic obstructive pulmonary disease, unspecified: Secondary | ICD-10-CM | POA: Diagnosis not present

## 2019-08-25 DIAGNOSIS — Z471 Aftercare following joint replacement surgery: Secondary | ICD-10-CM | POA: Diagnosis not present

## 2019-08-25 DIAGNOSIS — I700 Atherosclerosis of aorta: Secondary | ICD-10-CM | POA: Diagnosis not present

## 2019-08-25 DIAGNOSIS — Z96641 Presence of right artificial hip joint: Secondary | ICD-10-CM | POA: Diagnosis not present

## 2019-08-25 DIAGNOSIS — M810 Age-related osteoporosis without current pathological fracture: Secondary | ICD-10-CM | POA: Diagnosis not present

## 2019-08-25 DIAGNOSIS — I10 Essential (primary) hypertension: Secondary | ICD-10-CM | POA: Diagnosis not present

## 2019-08-27 ENCOUNTER — Other Ambulatory Visit: Payer: Self-pay | Admitting: Internal Medicine

## 2019-08-27 ENCOUNTER — Other Ambulatory Visit: Payer: Self-pay

## 2019-08-27 DIAGNOSIS — Z7951 Long term (current) use of inhaled steroids: Secondary | ICD-10-CM | POA: Diagnosis not present

## 2019-08-27 DIAGNOSIS — I10 Essential (primary) hypertension: Secondary | ICD-10-CM | POA: Diagnosis not present

## 2019-08-27 DIAGNOSIS — K219 Gastro-esophageal reflux disease without esophagitis: Secondary | ICD-10-CM | POA: Diagnosis not present

## 2019-08-27 DIAGNOSIS — J449 Chronic obstructive pulmonary disease, unspecified: Secondary | ICD-10-CM | POA: Diagnosis not present

## 2019-08-27 DIAGNOSIS — Z96641 Presence of right artificial hip joint: Secondary | ICD-10-CM | POA: Diagnosis not present

## 2019-08-27 DIAGNOSIS — Z9181 History of falling: Secondary | ICD-10-CM | POA: Diagnosis not present

## 2019-08-27 DIAGNOSIS — M810 Age-related osteoporosis without current pathological fracture: Secondary | ICD-10-CM | POA: Diagnosis not present

## 2019-08-27 DIAGNOSIS — Z471 Aftercare following joint replacement surgery: Secondary | ICD-10-CM | POA: Diagnosis not present

## 2019-08-27 DIAGNOSIS — I700 Atherosclerosis of aorta: Secondary | ICD-10-CM | POA: Diagnosis not present

## 2019-08-27 MED ORDER — AMLODIPINE BESYLATE 5 MG PO TABS: ORAL_TABLET | ORAL | 1 refills | Status: DC

## 2019-08-27 MED ORDER — FLUTICASONE-SALMETEROL 250-50 MCG/DOSE IN AEPB: INHALATION_SPRAY | RESPIRATORY_TRACT | 2 refills | Status: DC

## 2019-08-28 DIAGNOSIS — I700 Atherosclerosis of aorta: Secondary | ICD-10-CM | POA: Diagnosis not present

## 2019-08-28 DIAGNOSIS — Z471 Aftercare following joint replacement surgery: Secondary | ICD-10-CM | POA: Diagnosis not present

## 2019-08-28 DIAGNOSIS — Z96641 Presence of right artificial hip joint: Secondary | ICD-10-CM | POA: Diagnosis not present

## 2019-08-28 DIAGNOSIS — K219 Gastro-esophageal reflux disease without esophagitis: Secondary | ICD-10-CM | POA: Diagnosis not present

## 2019-08-28 DIAGNOSIS — I10 Essential (primary) hypertension: Secondary | ICD-10-CM | POA: Diagnosis not present

## 2019-08-28 DIAGNOSIS — M810 Age-related osteoporosis without current pathological fracture: Secondary | ICD-10-CM | POA: Diagnosis not present

## 2019-08-28 DIAGNOSIS — Z7951 Long term (current) use of inhaled steroids: Secondary | ICD-10-CM | POA: Diagnosis not present

## 2019-08-28 DIAGNOSIS — Z9181 History of falling: Secondary | ICD-10-CM | POA: Diagnosis not present

## 2019-08-28 DIAGNOSIS — J449 Chronic obstructive pulmonary disease, unspecified: Secondary | ICD-10-CM | POA: Diagnosis not present

## 2019-08-31 DIAGNOSIS — J449 Chronic obstructive pulmonary disease, unspecified: Secondary | ICD-10-CM | POA: Diagnosis not present

## 2019-08-31 DIAGNOSIS — I10 Essential (primary) hypertension: Secondary | ICD-10-CM | POA: Diagnosis not present

## 2019-08-31 DIAGNOSIS — K219 Gastro-esophageal reflux disease without esophagitis: Secondary | ICD-10-CM | POA: Diagnosis not present

## 2019-08-31 DIAGNOSIS — I700 Atherosclerosis of aorta: Secondary | ICD-10-CM | POA: Diagnosis not present

## 2019-08-31 DIAGNOSIS — Z9181 History of falling: Secondary | ICD-10-CM | POA: Diagnosis not present

## 2019-08-31 DIAGNOSIS — Z7951 Long term (current) use of inhaled steroids: Secondary | ICD-10-CM | POA: Diagnosis not present

## 2019-08-31 DIAGNOSIS — Z96641 Presence of right artificial hip joint: Secondary | ICD-10-CM | POA: Diagnosis not present

## 2019-08-31 DIAGNOSIS — M810 Age-related osteoporosis without current pathological fracture: Secondary | ICD-10-CM | POA: Diagnosis not present

## 2019-08-31 DIAGNOSIS — Z471 Aftercare following joint replacement surgery: Secondary | ICD-10-CM | POA: Diagnosis not present

## 2019-09-03 DIAGNOSIS — M810 Age-related osteoporosis without current pathological fracture: Secondary | ICD-10-CM | POA: Diagnosis not present

## 2019-09-03 DIAGNOSIS — Z471 Aftercare following joint replacement surgery: Secondary | ICD-10-CM | POA: Diagnosis not present

## 2019-09-03 DIAGNOSIS — Z7951 Long term (current) use of inhaled steroids: Secondary | ICD-10-CM | POA: Diagnosis not present

## 2019-09-03 DIAGNOSIS — K219 Gastro-esophageal reflux disease without esophagitis: Secondary | ICD-10-CM | POA: Diagnosis not present

## 2019-09-03 DIAGNOSIS — Z96641 Presence of right artificial hip joint: Secondary | ICD-10-CM | POA: Diagnosis not present

## 2019-09-03 DIAGNOSIS — I10 Essential (primary) hypertension: Secondary | ICD-10-CM | POA: Diagnosis not present

## 2019-09-03 DIAGNOSIS — J449 Chronic obstructive pulmonary disease, unspecified: Secondary | ICD-10-CM | POA: Diagnosis not present

## 2019-09-03 DIAGNOSIS — Z9181 History of falling: Secondary | ICD-10-CM | POA: Diagnosis not present

## 2019-09-03 DIAGNOSIS — I700 Atherosclerosis of aorta: Secondary | ICD-10-CM | POA: Diagnosis not present

## 2019-09-04 DIAGNOSIS — Z7951 Long term (current) use of inhaled steroids: Secondary | ICD-10-CM | POA: Diagnosis not present

## 2019-09-04 DIAGNOSIS — Z9181 History of falling: Secondary | ICD-10-CM | POA: Diagnosis not present

## 2019-09-04 DIAGNOSIS — M810 Age-related osteoporosis without current pathological fracture: Secondary | ICD-10-CM | POA: Diagnosis not present

## 2019-09-04 DIAGNOSIS — J449 Chronic obstructive pulmonary disease, unspecified: Secondary | ICD-10-CM | POA: Diagnosis not present

## 2019-09-04 DIAGNOSIS — I700 Atherosclerosis of aorta: Secondary | ICD-10-CM | POA: Diagnosis not present

## 2019-09-04 DIAGNOSIS — Z471 Aftercare following joint replacement surgery: Secondary | ICD-10-CM | POA: Diagnosis not present

## 2019-09-04 DIAGNOSIS — I10 Essential (primary) hypertension: Secondary | ICD-10-CM | POA: Diagnosis not present

## 2019-09-04 DIAGNOSIS — Z96641 Presence of right artificial hip joint: Secondary | ICD-10-CM | POA: Diagnosis not present

## 2019-09-04 DIAGNOSIS — K219 Gastro-esophageal reflux disease without esophagitis: Secondary | ICD-10-CM | POA: Diagnosis not present

## 2019-09-07 DIAGNOSIS — I700 Atherosclerosis of aorta: Secondary | ICD-10-CM | POA: Diagnosis not present

## 2019-09-07 DIAGNOSIS — Z471 Aftercare following joint replacement surgery: Secondary | ICD-10-CM | POA: Diagnosis not present

## 2019-09-07 DIAGNOSIS — Z7951 Long term (current) use of inhaled steroids: Secondary | ICD-10-CM | POA: Diagnosis not present

## 2019-09-07 DIAGNOSIS — Z9181 History of falling: Secondary | ICD-10-CM | POA: Diagnosis not present

## 2019-09-07 DIAGNOSIS — K219 Gastro-esophageal reflux disease without esophagitis: Secondary | ICD-10-CM | POA: Diagnosis not present

## 2019-09-07 DIAGNOSIS — Z96641 Presence of right artificial hip joint: Secondary | ICD-10-CM | POA: Diagnosis not present

## 2019-09-07 DIAGNOSIS — I10 Essential (primary) hypertension: Secondary | ICD-10-CM | POA: Diagnosis not present

## 2019-09-07 DIAGNOSIS — J449 Chronic obstructive pulmonary disease, unspecified: Secondary | ICD-10-CM | POA: Diagnosis not present

## 2019-09-07 DIAGNOSIS — M810 Age-related osteoporosis without current pathological fracture: Secondary | ICD-10-CM | POA: Diagnosis not present

## 2019-09-08 DIAGNOSIS — Z471 Aftercare following joint replacement surgery: Secondary | ICD-10-CM | POA: Diagnosis not present

## 2019-09-08 DIAGNOSIS — K219 Gastro-esophageal reflux disease without esophagitis: Secondary | ICD-10-CM | POA: Diagnosis not present

## 2019-09-08 DIAGNOSIS — I700 Atherosclerosis of aorta: Secondary | ICD-10-CM | POA: Diagnosis not present

## 2019-09-08 DIAGNOSIS — Z96641 Presence of right artificial hip joint: Secondary | ICD-10-CM | POA: Diagnosis not present

## 2019-09-08 DIAGNOSIS — Z7951 Long term (current) use of inhaled steroids: Secondary | ICD-10-CM | POA: Diagnosis not present

## 2019-09-08 DIAGNOSIS — I10 Essential (primary) hypertension: Secondary | ICD-10-CM | POA: Diagnosis not present

## 2019-09-08 DIAGNOSIS — J449 Chronic obstructive pulmonary disease, unspecified: Secondary | ICD-10-CM | POA: Diagnosis not present

## 2019-09-08 DIAGNOSIS — Z9181 History of falling: Secondary | ICD-10-CM | POA: Diagnosis not present

## 2019-09-08 DIAGNOSIS — M810 Age-related osteoporosis without current pathological fracture: Secondary | ICD-10-CM | POA: Diagnosis not present

## 2019-09-11 DIAGNOSIS — I10 Essential (primary) hypertension: Secondary | ICD-10-CM | POA: Diagnosis not present

## 2019-09-11 DIAGNOSIS — Z7951 Long term (current) use of inhaled steroids: Secondary | ICD-10-CM | POA: Diagnosis not present

## 2019-09-11 DIAGNOSIS — Z9181 History of falling: Secondary | ICD-10-CM | POA: Diagnosis not present

## 2019-09-11 DIAGNOSIS — I700 Atherosclerosis of aorta: Secondary | ICD-10-CM | POA: Diagnosis not present

## 2019-09-11 DIAGNOSIS — Z471 Aftercare following joint replacement surgery: Secondary | ICD-10-CM | POA: Diagnosis not present

## 2019-09-11 DIAGNOSIS — K219 Gastro-esophageal reflux disease without esophagitis: Secondary | ICD-10-CM | POA: Diagnosis not present

## 2019-09-11 DIAGNOSIS — Z96641 Presence of right artificial hip joint: Secondary | ICD-10-CM | POA: Diagnosis not present

## 2019-09-11 DIAGNOSIS — J449 Chronic obstructive pulmonary disease, unspecified: Secondary | ICD-10-CM | POA: Diagnosis not present

## 2019-09-11 DIAGNOSIS — M810 Age-related osteoporosis without current pathological fracture: Secondary | ICD-10-CM | POA: Diagnosis not present

## 2019-09-16 DIAGNOSIS — J449 Chronic obstructive pulmonary disease, unspecified: Secondary | ICD-10-CM | POA: Diagnosis not present

## 2019-09-16 DIAGNOSIS — I700 Atherosclerosis of aorta: Secondary | ICD-10-CM | POA: Diagnosis not present

## 2019-09-16 DIAGNOSIS — Z7951 Long term (current) use of inhaled steroids: Secondary | ICD-10-CM | POA: Diagnosis not present

## 2019-09-16 DIAGNOSIS — Z9181 History of falling: Secondary | ICD-10-CM | POA: Diagnosis not present

## 2019-09-16 DIAGNOSIS — Z96641 Presence of right artificial hip joint: Secondary | ICD-10-CM | POA: Diagnosis not present

## 2019-09-16 DIAGNOSIS — Z471 Aftercare following joint replacement surgery: Secondary | ICD-10-CM | POA: Diagnosis not present

## 2019-09-16 DIAGNOSIS — M810 Age-related osteoporosis without current pathological fracture: Secondary | ICD-10-CM | POA: Diagnosis not present

## 2019-09-16 DIAGNOSIS — K219 Gastro-esophageal reflux disease without esophagitis: Secondary | ICD-10-CM | POA: Diagnosis not present

## 2019-09-16 DIAGNOSIS — I10 Essential (primary) hypertension: Secondary | ICD-10-CM | POA: Diagnosis not present

## 2019-09-18 DIAGNOSIS — K219 Gastro-esophageal reflux disease without esophagitis: Secondary | ICD-10-CM | POA: Diagnosis not present

## 2019-09-18 DIAGNOSIS — Z7951 Long term (current) use of inhaled steroids: Secondary | ICD-10-CM | POA: Diagnosis not present

## 2019-09-18 DIAGNOSIS — Z9181 History of falling: Secondary | ICD-10-CM | POA: Diagnosis not present

## 2019-09-18 DIAGNOSIS — I10 Essential (primary) hypertension: Secondary | ICD-10-CM | POA: Diagnosis not present

## 2019-09-18 DIAGNOSIS — M810 Age-related osteoporosis without current pathological fracture: Secondary | ICD-10-CM | POA: Diagnosis not present

## 2019-09-18 DIAGNOSIS — I700 Atherosclerosis of aorta: Secondary | ICD-10-CM | POA: Diagnosis not present

## 2019-09-18 DIAGNOSIS — J449 Chronic obstructive pulmonary disease, unspecified: Secondary | ICD-10-CM | POA: Diagnosis not present

## 2019-09-18 DIAGNOSIS — Z96641 Presence of right artificial hip joint: Secondary | ICD-10-CM | POA: Diagnosis not present

## 2019-09-18 DIAGNOSIS — Z471 Aftercare following joint replacement surgery: Secondary | ICD-10-CM | POA: Diagnosis not present

## 2019-09-21 DIAGNOSIS — H40003 Preglaucoma, unspecified, bilateral: Secondary | ICD-10-CM | POA: Diagnosis not present

## 2019-09-22 DIAGNOSIS — M810 Age-related osteoporosis without current pathological fracture: Secondary | ICD-10-CM | POA: Diagnosis not present

## 2019-09-22 DIAGNOSIS — Z7951 Long term (current) use of inhaled steroids: Secondary | ICD-10-CM | POA: Diagnosis not present

## 2019-09-22 DIAGNOSIS — Z471 Aftercare following joint replacement surgery: Secondary | ICD-10-CM | POA: Diagnosis not present

## 2019-09-22 DIAGNOSIS — J449 Chronic obstructive pulmonary disease, unspecified: Secondary | ICD-10-CM | POA: Diagnosis not present

## 2019-09-22 DIAGNOSIS — Z96641 Presence of right artificial hip joint: Secondary | ICD-10-CM | POA: Diagnosis not present

## 2019-09-22 DIAGNOSIS — I700 Atherosclerosis of aorta: Secondary | ICD-10-CM | POA: Diagnosis not present

## 2019-09-22 DIAGNOSIS — I10 Essential (primary) hypertension: Secondary | ICD-10-CM | POA: Diagnosis not present

## 2019-09-22 DIAGNOSIS — Z9181 History of falling: Secondary | ICD-10-CM | POA: Diagnosis not present

## 2019-09-22 DIAGNOSIS — K219 Gastro-esophageal reflux disease without esophagitis: Secondary | ICD-10-CM | POA: Diagnosis not present

## 2019-09-24 DIAGNOSIS — Z7951 Long term (current) use of inhaled steroids: Secondary | ICD-10-CM | POA: Diagnosis not present

## 2019-09-24 DIAGNOSIS — Z471 Aftercare following joint replacement surgery: Secondary | ICD-10-CM | POA: Diagnosis not present

## 2019-09-24 DIAGNOSIS — K219 Gastro-esophageal reflux disease without esophagitis: Secondary | ICD-10-CM | POA: Diagnosis not present

## 2019-09-24 DIAGNOSIS — I700 Atherosclerosis of aorta: Secondary | ICD-10-CM | POA: Diagnosis not present

## 2019-09-24 DIAGNOSIS — I10 Essential (primary) hypertension: Secondary | ICD-10-CM | POA: Diagnosis not present

## 2019-09-24 DIAGNOSIS — Z9181 History of falling: Secondary | ICD-10-CM | POA: Diagnosis not present

## 2019-09-24 DIAGNOSIS — Z96641 Presence of right artificial hip joint: Secondary | ICD-10-CM | POA: Diagnosis not present

## 2019-09-24 DIAGNOSIS — M810 Age-related osteoporosis without current pathological fracture: Secondary | ICD-10-CM | POA: Diagnosis not present

## 2019-09-24 DIAGNOSIS — J449 Chronic obstructive pulmonary disease, unspecified: Secondary | ICD-10-CM | POA: Diagnosis not present

## 2019-09-27 DIAGNOSIS — Z471 Aftercare following joint replacement surgery: Secondary | ICD-10-CM | POA: Diagnosis not present

## 2019-09-29 DIAGNOSIS — M1611 Unilateral primary osteoarthritis, right hip: Secondary | ICD-10-CM | POA: Diagnosis not present

## 2019-10-01 DIAGNOSIS — Z7951 Long term (current) use of inhaled steroids: Secondary | ICD-10-CM | POA: Diagnosis not present

## 2019-10-01 DIAGNOSIS — K219 Gastro-esophageal reflux disease without esophagitis: Secondary | ICD-10-CM | POA: Diagnosis not present

## 2019-10-01 DIAGNOSIS — Z471 Aftercare following joint replacement surgery: Secondary | ICD-10-CM | POA: Diagnosis not present

## 2019-10-01 DIAGNOSIS — Z96641 Presence of right artificial hip joint: Secondary | ICD-10-CM | POA: Diagnosis not present

## 2019-10-01 DIAGNOSIS — Z9181 History of falling: Secondary | ICD-10-CM | POA: Diagnosis not present

## 2019-10-01 DIAGNOSIS — M810 Age-related osteoporosis without current pathological fracture: Secondary | ICD-10-CM | POA: Diagnosis not present

## 2019-10-01 DIAGNOSIS — I10 Essential (primary) hypertension: Secondary | ICD-10-CM | POA: Diagnosis not present

## 2019-10-01 DIAGNOSIS — I700 Atherosclerosis of aorta: Secondary | ICD-10-CM | POA: Diagnosis not present

## 2019-10-01 DIAGNOSIS — J449 Chronic obstructive pulmonary disease, unspecified: Secondary | ICD-10-CM | POA: Diagnosis not present

## 2019-10-06 DIAGNOSIS — Z471 Aftercare following joint replacement surgery: Secondary | ICD-10-CM | POA: Diagnosis not present

## 2019-10-06 DIAGNOSIS — I10 Essential (primary) hypertension: Secondary | ICD-10-CM | POA: Diagnosis not present

## 2019-10-06 DIAGNOSIS — J449 Chronic obstructive pulmonary disease, unspecified: Secondary | ICD-10-CM | POA: Diagnosis not present

## 2019-10-06 DIAGNOSIS — Z7951 Long term (current) use of inhaled steroids: Secondary | ICD-10-CM | POA: Diagnosis not present

## 2019-10-06 DIAGNOSIS — Z9181 History of falling: Secondary | ICD-10-CM | POA: Diagnosis not present

## 2019-10-06 DIAGNOSIS — M810 Age-related osteoporosis without current pathological fracture: Secondary | ICD-10-CM | POA: Diagnosis not present

## 2019-10-06 DIAGNOSIS — Z96641 Presence of right artificial hip joint: Secondary | ICD-10-CM | POA: Diagnosis not present

## 2019-10-06 DIAGNOSIS — K219 Gastro-esophageal reflux disease without esophagitis: Secondary | ICD-10-CM | POA: Diagnosis not present

## 2019-10-06 DIAGNOSIS — I700 Atherosclerosis of aorta: Secondary | ICD-10-CM | POA: Diagnosis not present

## 2019-10-14 ENCOUNTER — Other Ambulatory Visit: Payer: Self-pay | Admitting: Internal Medicine

## 2019-10-28 DIAGNOSIS — H35051 Retinal neovascularization, unspecified, right eye: Secondary | ICD-10-CM | POA: Diagnosis not present

## 2019-11-11 ENCOUNTER — Other Ambulatory Visit: Payer: Self-pay | Admitting: Adult Health

## 2019-11-12 NOTE — Telephone Encounter (Signed)
Last 08/20/19 and next 11/16/18

## 2019-11-17 ENCOUNTER — Ambulatory Visit: Payer: Medicare Other | Admitting: Adult Health

## 2019-11-17 DIAGNOSIS — H40053 Ocular hypertension, bilateral: Secondary | ICD-10-CM | POA: Diagnosis not present

## 2019-11-17 DIAGNOSIS — H35051 Retinal neovascularization, unspecified, right eye: Secondary | ICD-10-CM | POA: Diagnosis not present

## 2019-11-18 ENCOUNTER — Other Ambulatory Visit: Payer: Self-pay | Admitting: Internal Medicine

## 2019-11-24 ENCOUNTER — Telehealth: Payer: Self-pay

## 2019-11-24 NOTE — Telephone Encounter (Signed)
Confirmed appointment on 11/26/2019 and screened for covid. klh 

## 2019-11-26 ENCOUNTER — Ambulatory Visit (INDEPENDENT_AMBULATORY_CARE_PROVIDER_SITE_OTHER): Payer: Medicare Other | Admitting: Adult Health

## 2019-11-26 ENCOUNTER — Encounter: Payer: Self-pay | Admitting: Adult Health

## 2019-11-26 ENCOUNTER — Other Ambulatory Visit: Payer: Self-pay

## 2019-11-26 VITALS — BP 138/80 | HR 75 | Temp 97.3°F | Resp 16 | Ht <= 58 in | Wt 82.0 lb

## 2019-11-26 DIAGNOSIS — B351 Tinea unguium: Secondary | ICD-10-CM

## 2019-11-26 DIAGNOSIS — K219 Gastro-esophageal reflux disease without esophagitis: Secondary | ICD-10-CM | POA: Diagnosis not present

## 2019-11-26 DIAGNOSIS — Z79899 Other long term (current) drug therapy: Secondary | ICD-10-CM | POA: Diagnosis not present

## 2019-11-26 DIAGNOSIS — I10 Essential (primary) hypertension: Secondary | ICD-10-CM | POA: Diagnosis not present

## 2019-11-26 LAB — POCT URINE DRUG SCREEN
POC Amphetamine UR: NOT DETECTED
POC BENZODIAZEPINES UR: NOT DETECTED
POC Barbiturate UR: NOT DETECTED
POC Cocaine UR: NOT DETECTED
POC Ecstasy UR: NOT DETECTED
POC Marijuana UR: NOT DETECTED
POC Methadone UR: NOT DETECTED
POC Methamphetamine UR: NOT DETECTED
POC Opiate Ur: NOT DETECTED
POC Oxycodone UR: NOT DETECTED
POC PHENCYCLIDINE UR: NOT DETECTED
POC TRICYCLICS UR: POSITIVE — AB

## 2019-11-26 NOTE — Progress Notes (Signed)
[facility] [place], [place] [address]  Internal MEDICINE  Office Visit Note  Patient Name: Pam Cruz  [id number]  [id number]  Date of Service: 11/26/2019  Chief Complaint  Patient presents with  . Follow-up  . Gastroesophageal Reflux  . Hypertension    HPI  Pt is seen today for follow up on GERD, HTN. Overall she is doing well.  She denies any issues with her bp.  She does not check it at home.  She was initially elevated today, however it normalized on recheck.  She Denies Chest pain, Shortness of breath, palpitations, headache, or blurred vision.     Current Medication: Outpatient Encounter Medications as of 11/26/2019  Medication Sig  . acetaminophen (TYLENOL) 500 MG tablet Take 500 mg by mouth every 6 (six) hours as needed (pain).   Marland Kitchen amLODipine (NORVASC) 5 MG tablet TAKE 1 AND 1/2 TABLETS BY  MOUTH EVERY DAY FOR  HYPERTENSION  . clindamycin (CLEOCIN) 150 MG capsule Take 150 mg by mouth 3 (three) times daily.  . clonazePAM (KLONOPIN) 0.5 MG tablet TAKE 1 TABLET BY MOUTH  TWICE DAILY AS NEEDED FOR  ANXIETY  . desipramine (NORPRAMIN) 25 MG tablet TAKE 1 TABLET BY MOUTH  DAILY (Patient taking differently: Take 25 mg by mouth daily as needed (stomach issues). )  . fluticasone (FLONASE) 50 MCG/ACT nasal spray USE 2 SPRAYS IN EACH  NOSTRIL EVERY NIGHT AT  BEDTIME (Patient taking differently: Place 2 sprays into both nostrils at bedtime. )  . Fluticasone-Salmeterol (WIXELA INHUB) 250-50 MCG/DOSE AEPB USE 1 PUFF TWICE DAILY  . hydrALAZINE (APRESOLINE) 50 MG tablet TAKE ONE-HALF TABLET BY  MOUTH 3 TIMES DAILY  . levothyroxine (SYNTHROID) 50 MCG tablet TAKE ONE-HALF TABLET BY  MOUTH ONCE A DAY ON AN  EMPTY STOMACH  . losartan (COZAAR) 50 MG tablet TAKE 1 TABLET BY MOUTH  DAILY  . montelukast (SINGULAIR) 10 MG tablet TAKE 1 TABLET BY MOUTH  DAILY  . Multiple Vitamins-Minerals (PRESERVISION AREDS 2+MULTI VIT) CAPS Take 1 capsule by mouth 2 (two) times daily.    Marland Kitchen omeprazole (PRILOSEC) 20 MG capsule Take 20 mg by mouth 2 (two) times daily before a meal.   . oxyCODONE (OXY IR/ROXICODONE) 5 MG immediate release tablet Take 1 tablet (5 mg total) by mouth every 4 (four) hours as needed for moderate pain (pain score 4-6).  Marland Kitchen sennosides-docusate sodium (SENOKOT-S) 8.6-50 MG tablet Take 1-2 tablets by mouth at bedtime.   . terbinafine (LAMISIL) 1 % cream Apply 1 application topically 2 (two) times daily as needed (toes).   . enoxaparin (LOVENOX) 40 MG/0.4ML injection Inject 0.4 mLs (40 mg total) into the skin daily for 14 days.   No facility-administered encounter medications on file as of 11/26/2019.    Surgical History: Past Surgical History:  Procedure Laterality Date  . COLONOSCOPY    . cysto    . LAPAROSCOPY    . TOTAL HIP ARTHROPLASTY Right 08/17/2019   Procedure: TOTAL HIP ARTHROPLASTY;  Surgeon: Dereck Leep, MD;  Location: [facility] ORS;  Service: Orthopedics;  Laterality: Right;    Medical History: Past Medical History:  Diagnosis Date  . Anxiety   . Arthritis   . COPD (chronic obstructive pulmonary disease) ([place])   . Dyspnea   . Gastritis   . GERD (gastroesophageal reflux disease)   . Hypertension   . Osteoporosis     Family History: Family History  Problem Relation Age of Onset  . Hypertension Mother   .  Coronary artery disease Father   . Heart disease Father     Social History   Socioeconomic History  . Marital status: Single    Spouse name: Not on file  . Number of children: Not on file  . Years of education: Not on file  . Highest education level: Not on file  Occupational History  . Not on file  Tobacco Use  . Smoking status: Former Smoker    Quit date: 02/20/2001    Years since quitting: 18.7  . Smokeless tobacco: Never Used  . Tobacco comment: quit 17 years ago  Substance and Sexual Activity  . Alcohol use: Yes    Alcohol/week: 2.0 standard drinks    Types: 2 Glasses of wine per week    Comment: very rarely   . Drug use: Never  . Sexual activity: Not on file  Other Topics Concern  . Not on file  Social History Narrative  . Not on file   Social Determinants of Health   Financial Resource Strain:   . Difficulty of Paying Living Expenses: Not on file  Food Insecurity:   . Worried About Charity fundraiser in the Last Year: Not on file  . Ran Out of Food in the Last Year: Not on file  Transportation Needs:   . Lack of Transportation (Medical): Not on file  . Lack of Transportation (Non-Medical): Not on file  Physical Activity:   . Days of Exercise per Week: Not on file  . Minutes of Exercise per Session: Not on file  Stress:   . Feeling of Stress : Not on file  Social Connections:   . Frequency of Communication with Friends and Family: Not on file  . Frequency of Social Gatherings with Friends and Family: Not on file  . Attends Religious Services: Not on file  . Active Member of Clubs or Organizations: Not on file  . Attends Archivist Meetings: Not on file  . Marital Status: Not on file  Intimate Partner Violence:   . Fear of Current or Ex-Partner: Not on file  . Emotionally Abused: Not on file  . Physically Abused: Not on file  . Sexually Abused: Not on file      Review of Systems  Constitutional: Negative for chills, fatigue and unexpected weight change.  HENT: Negative for congestion, rhinorrhea, sneezing and sore throat.   Eyes: Negative for photophobia, pain and redness.  Respiratory: Negative for cough, chest tightness and shortness of breath.   Cardiovascular: Negative for chest pain and palpitations.  Gastrointestinal: Negative for abdominal pain, constipation, diarrhea, nausea and vomiting.  Endocrine: Negative.   Genitourinary: Negative for dysuria and frequency.  Musculoskeletal: Negative for arthralgias, back pain, joint swelling and neck pain.  Skin: Negative for rash.  Allergic/Immunologic: Negative.   Neurological: Negative for tremors and  numbness.  Hematological: Negative for adenopathy. Does not bruise/bleed easily.  Psychiatric/Behavioral: Negative for behavioral problems and sleep disturbance. The patient is not nervous/anxious.     Vital Signs: BP (!) 168/90   Pulse 75   Temp (!) 97.3 F (36.3 C)   Resp 16   Ht 4\' 7"  (1.397 m)   Wt 82 lb (37.2 kg)   SpO2 98%   BMI 19.06 kg/m    Physical Exam Vitals and nursing note reviewed.  Constitutional:      General: She is not in acute distress.    Appearance: She is well-developed. She is not diaphoretic.  HENT:     Head: Normocephalic  and atraumatic.     Mouth/Throat:     Pharynx: No oropharyngeal exudate.  Eyes:     Pupils: Pupils are equal, round, and reactive to light.  Neck:     Thyroid: No thyromegaly.     Vascular: No JVD.     Trachea: No tracheal deviation.  Cardiovascular:     Rate and Rhythm: Normal rate and regular rhythm.     Heart sounds: Normal heart sounds. No murmur. No friction rub. No gallop.   Pulmonary:     Effort: Pulmonary effort is normal. No respiratory distress.     Breath sounds: Normal breath sounds. No wheezing or rales.  Chest:     Chest wall: No tenderness.  Abdominal:     Palpations: Abdomen is soft.     Tenderness: There is no abdominal tenderness. There is no guarding.  Musculoskeletal:        General: Normal range of motion.     Cervical back: Normal range of motion and neck supple.  Lymphadenopathy:     Cervical: No cervical adenopathy.  Skin:    General: Skin is warm and dry.  Neurological:     Mental Status: She is alert and oriented to person, place, and time.     Cranial Nerves: No cranial nerve deficit.  Psychiatric:        Behavior: Behavior normal.        Thought Content: Thought content normal.        Judgment: Judgment normal.        Assessment/Plan: 1. Essential hypertension, benign Stop Norvasc due to swelling.   2. Encounter for long-term (current) use of medications - POCT Urine Drug  Screen  3. Toenail fungus Continue lamisil.   4. Gastroesophageal reflux disease without esophagitis Controlled, continue present management.   General Counseling: Pam Cruz understanding of the findings of todays visit and agrees with plan of treatment. I have discussed any further diagnostic evaluation that may be needed or ordered today. We also reviewed her medications today. she has been encouraged to call the office with any questions or concerns that should arise related to todays visit.    Orders Placed This Encounter  Procedures  . POCT Urine Drug Screen    No orders of the defined types were placed in this encounter.   Time spent: 30 Minutes   This patient was seen by Orson Gear AGNP-C in Collaboration with Dr Lavera Guise as a part of collaborative care agreement     Kendell Bane AGNP-C Internal medicine

## 2019-12-04 DIAGNOSIS — H40053 Ocular hypertension, bilateral: Secondary | ICD-10-CM | POA: Diagnosis not present

## 2019-12-07 ENCOUNTER — Encounter: Payer: Self-pay | Admitting: Adult Health

## 2019-12-07 ENCOUNTER — Ambulatory Visit: Payer: Medicare Other | Admitting: Adult Health

## 2019-12-07 VITALS — Ht <= 58 in | Wt 82.0 lb

## 2019-12-07 NOTE — Progress Notes (Signed)
Error

## 2019-12-09 DIAGNOSIS — K582 Mixed irritable bowel syndrome: Secondary | ICD-10-CM | POA: Diagnosis not present

## 2019-12-09 DIAGNOSIS — K219 Gastro-esophageal reflux disease without esophagitis: Secondary | ICD-10-CM | POA: Diagnosis not present

## 2019-12-10 ENCOUNTER — Ambulatory Visit (INDEPENDENT_AMBULATORY_CARE_PROVIDER_SITE_OTHER): Payer: Medicare Other | Admitting: Adult Health

## 2019-12-10 ENCOUNTER — Encounter: Payer: Self-pay | Admitting: Adult Health

## 2019-12-10 VITALS — Resp 16 | Ht <= 58 in | Wt 82.0 lb

## 2019-12-10 DIAGNOSIS — K219 Gastro-esophageal reflux disease without esophagitis: Secondary | ICD-10-CM | POA: Diagnosis not present

## 2019-12-10 DIAGNOSIS — E782 Mixed hyperlipidemia: Secondary | ICD-10-CM

## 2019-12-10 DIAGNOSIS — I10 Essential (primary) hypertension: Secondary | ICD-10-CM | POA: Diagnosis not present

## 2019-12-10 NOTE — Progress Notes (Signed)
[facility] [place], [place] [address]  Internal MEDICINE  Telephone Visit  Patient Name: Pam Cruz  [id number]  [id number]  Date of Service: 12/10/2019  I connected with the patient at [phone] by telephone and verified the patients identity using two identifiers.   I discussed the limitations, risks, security and privacy concerns of performing an evaluation and management service by telephone and the availability of in person appointments. I also discussed with the patient that there may be a patient responsible charge related to the service.  The patient expressed understanding and agrees to proceed.    Chief Complaint  Patient presents with  . Telephone Assessment  . Telephone Screen  . Gastroesophageal Reflux  . Hypertension    bp reading 190/90 took again was 200/89     HPI  Pt seen via telephone.  She reports she went to [facility] clinic yesterday to see Gi and had high blood pressure. They told her to contact her pcp because her bp was 190/90, and 200/89. She reports she has been having headaches intermittently since stopping the amlodipine about 10 days ago.     Current Medication: Outpatient Encounter Medications as of 12/10/2019  Medication Sig  . acetaminophen (TYLENOL) 500 MG tablet Take 500 mg by mouth every 6 (six) hours as needed (pain).   . clindamycin (CLEOCIN) 150 MG capsule Take 150 mg by mouth 3 (three) times daily.  . clonazePAM (KLONOPIN) 0.5 MG tablet TAKE 1 TABLET BY MOUTH  TWICE DAILY AS NEEDED FOR  ANXIETY  . desipramine (NORPRAMIN) 25 MG tablet TAKE 1 TABLET BY MOUTH  DAILY (Patient taking differently: Take 25 mg by mouth daily as needed (stomach issues). )  . fluticasone (FLONASE) 50 MCG/ACT nasal spray USE 2 SPRAYS IN EACH  NOSTRIL EVERY NIGHT AT  BEDTIME (Patient taking differently: Place 2 sprays into both nostrils at bedtime. )  . Fluticasone-Salmeterol (WIXELA INHUB) 250-50 MCG/DOSE AEPB USE 1 PUFF TWICE DAILY  .  hydrALAZINE (APRESOLINE) 50 MG tablet TAKE ONE-HALF TABLET BY  MOUTH 3 TIMES DAILY  . levothyroxine (SYNTHROID) 50 MCG tablet TAKE ONE-HALF TABLET BY  MOUTH ONCE A DAY ON AN  EMPTY STOMACH  . losartan (COZAAR) 50 MG tablet TAKE 1 TABLET BY MOUTH  DAILY  . montelukast (SINGULAIR) 10 MG tablet TAKE 1 TABLET BY MOUTH  DAILY  . Multiple Vitamins-Minerals (PRESERVISION AREDS 2+MULTI VIT) CAPS Take 1 capsule by mouth 2 (two) times daily.   Marland Kitchen omeprazole (PRILOSEC) 20 MG capsule Take 20 mg by mouth 2 (two) times daily before a meal.   . oxyCODONE (OXY IR/ROXICODONE) 5 MG immediate release tablet Take 1 tablet (5 mg total) by mouth every 4 (four) hours as needed for moderate pain (pain score 4-6).  Marland Kitchen sennosides-docusate sodium (SENOKOT-S) 8.6-50 MG tablet Take 1-2 tablets by mouth at bedtime.   . terbinafine (LAMISIL) 1 % cream Apply 1 application topically 2 (two) times daily as needed (toes).   . enoxaparin (LOVENOX) 40 MG/0.4ML injection Inject 0.4 mLs (40 mg total) into the skin daily for 14 days.   No facility-administered encounter medications on file as of 12/10/2019.    Surgical History: Past Surgical History:  Procedure Laterality Date  . COLONOSCOPY    . cysto    . LAPAROSCOPY    . TOTAL HIP ARTHROPLASTY Right 08/17/2019   Procedure: TOTAL HIP ARTHROPLASTY;  Surgeon: Dereck Leep, MD;  Location: [facility] ORS;  Service: Orthopedics;  Laterality: Right;    Medical History:  Past Medical History:  Diagnosis Date  . Anxiety   . Arthritis   . COPD (chronic obstructive pulmonary disease) ([place])   . Dyspnea   . Gastritis   . GERD (gastroesophageal reflux disease)   . Hypertension   . Osteoporosis     Family History: Family History  Problem Relation Age of Onset  . Hypertension Mother   . Coronary artery disease Father   . Heart disease Father     Social History   Socioeconomic History  . Marital status: Single    Spouse name: Not on file  . Number of children: Not on file  .  Years of education: Not on file  . Highest education level: Not on file  Occupational History  . Not on file  Tobacco Use  . Smoking status: Former Smoker    Quit date: 02/20/2001    Years since quitting: 18.8  . Smokeless tobacco: Never Used  . Tobacco comment: quit 17 years ago  Substance and Sexual Activity  . Alcohol use: Yes    Alcohol/week: 2.0 standard drinks    Types: 2 Glasses of wine per week    Comment: very rarely  . Drug use: Never  . Sexual activity: Not on file  Other Topics Concern  . Not on file  Social History Narrative  . Not on file   Social Determinants of Health   Financial Resource Strain:   . Difficulty of Paying Living Expenses: Not on file  Food Insecurity:   . Worried About Charity fundraiser in the Last Year: Not on file  . Ran Out of Food in the Last Year: Not on file  Transportation Needs:   . Lack of Transportation (Medical): Not on file  . Lack of Transportation (Non-Medical): Not on file  Physical Activity:   . Days of Exercise per Week: Not on file  . Minutes of Exercise per Session: Not on file  Stress:   . Feeling of Stress : Not on file  Social Connections:   . Frequency of Communication with Friends and Family: Not on file  . Frequency of Social Gatherings with Friends and Family: Not on file  . Attends Religious Services: Not on file  . Active Member of Clubs or Organizations: Not on file  . Attends Archivist Meetings: Not on file  . Marital Status: Not on file  Intimate Partner Violence:   . Fear of Current or Ex-Partner: Not on file  . Emotionally Abused: Not on file  . Physically Abused: Not on file  . Sexually Abused: Not on file      Review of Systems  Constitutional: Negative for chills, fatigue and unexpected weight change.  HENT: Negative for congestion, rhinorrhea, sneezing and sore throat.   Eyes: Negative for photophobia, pain and redness.  Respiratory: Negative for cough, chest tightness and  shortness of breath.   Cardiovascular: Negative for chest pain and palpitations.  Gastrointestinal: Negative for abdominal pain, constipation, diarrhea, nausea and vomiting.  Endocrine: Negative.   Genitourinary: Negative for dysuria and frequency.  Musculoskeletal: Negative for arthralgias, back pain, joint swelling and neck pain.  Skin: Negative for rash.  Allergic/Immunologic: Negative.   Neurological: Negative for tremors and numbness.  Hematological: Negative for adenopathy. Does not bruise/bleed easily.  Psychiatric/Behavioral: Negative for behavioral problems and sleep disturbance. The patient is not nervous/anxious.     Vital Signs: Resp 16   Ht 4\' 7"  (1.397 m)   Wt 82 lb (37.2 kg)   BMI  19.06 kg/m    Observation/Objective:  Well sounding, nad noted.  Assessment/Plan: 1. Essential hypertension, benign Remains elevated, will increase losartan to 50mg  BID.  Will see patient in 3 weeks in office, or before if she continues to have headaches.   2. Gastroesophageal reflux disease without esophagitis Controlled, continue present management.   3. Mixed hyperlipidemia Controlled, continue present management.   General Counseling: shantell remaly understanding of the findings of today's phone visit and agrees with plan of treatment. I have discussed any further diagnostic evaluation that may be needed or ordered today. We also reviewed her medications today. she has been encouraged to call the office with any questions or concerns that should arise related to todays visit.    No orders of the defined types were placed in this encounter.   No orders of the defined types were placed in this encounter.   Time spent: [facility]

## 2019-12-14 ENCOUNTER — Other Ambulatory Visit: Payer: Self-pay

## 2019-12-14 ENCOUNTER — Ambulatory Visit (INDEPENDENT_AMBULATORY_CARE_PROVIDER_SITE_OTHER): Payer: Medicare Other

## 2019-12-14 VITALS — BP 174/88 | HR 82 | Resp 16 | Ht <= 58 in | Wt 82.0 lb

## 2019-12-14 DIAGNOSIS — I10 Essential (primary) hypertension: Secondary | ICD-10-CM | POA: Diagnosis not present

## 2019-12-14 NOTE — Progress Notes (Signed)
Adam spoke with pt and increased her hydralazine 50 mg take 1 tab po in am,1/2 tab at noon and 1/2 tab at night and continue all med as prescribed and pt had follow up with adam next week

## 2019-12-17 ENCOUNTER — Telehealth: Payer: Self-pay

## 2019-12-17 NOTE — Telephone Encounter (Signed)
Called lmom informing patient of appointment on 12/21/2019. klh

## 2019-12-17 NOTE — Telephone Encounter (Signed)
CONFIRMED AND SCREENED FOR 12-21-19 OV.

## 2019-12-21 ENCOUNTER — Encounter: Payer: Self-pay | Admitting: Adult Health

## 2019-12-21 ENCOUNTER — Ambulatory Visit (INDEPENDENT_AMBULATORY_CARE_PROVIDER_SITE_OTHER): Payer: Medicare Other | Admitting: Adult Health

## 2019-12-21 ENCOUNTER — Other Ambulatory Visit: Payer: Self-pay

## 2019-12-21 VITALS — BP 160/88 | HR 74 | Temp 96.7°F | Resp 16 | Ht <= 58 in | Wt 83.0 lb

## 2019-12-21 DIAGNOSIS — R600 Localized edema: Secondary | ICD-10-CM | POA: Diagnosis not present

## 2019-12-21 DIAGNOSIS — I10 Essential (primary) hypertension: Secondary | ICD-10-CM

## 2019-12-21 DIAGNOSIS — K219 Gastro-esophageal reflux disease without esophagitis: Secondary | ICD-10-CM | POA: Diagnosis not present

## 2019-12-21 NOTE — Progress Notes (Signed)
[facility] [place], [place] [address]  Internal MEDICINE  Office Visit Note  Patient Name: Pam Cruz  [id number]  [id number]  Date of Service: 12/23/2019  Chief Complaint  Patient presents with  . Hypertension    HPI  Pt is here for follow up on HTN.  Her bp continues to be slightly elevated 160/88 today.  She is taking the hydralazine TID with losartan BID.  She continues to report some headaches through out the day.  She also reports continued "bloated" feeling in her legs and stomach. She originally contributed this to the Norvasc, but she has been off of this for 3 weeks.    Current Medication: Outpatient Encounter Medications as of 12/21/2019  Medication Sig  . acetaminophen (TYLENOL) 500 MG tablet Take 500 mg by mouth every 6 (six) hours as needed (pain).   . clindamycin (CLEOCIN) 150 MG capsule Take 150 mg by mouth 3 (three) times daily.  . clonazePAM (KLONOPIN) 0.5 MG tablet TAKE 1 TABLET BY MOUTH  TWICE DAILY AS NEEDED FOR  ANXIETY  . desipramine (NORPRAMIN) 25 MG tablet TAKE 1 TABLET BY MOUTH  DAILY (Patient taking differently: Take 25 mg by mouth daily as needed (stomach issues). )  . fluticasone (FLONASE) 50 MCG/ACT nasal spray USE 2 SPRAYS IN EACH  NOSTRIL EVERY NIGHT AT  BEDTIME (Patient taking differently: Place 2 sprays into both nostrils at bedtime. )  . Fluticasone-Salmeterol (WIXELA INHUB) 250-50 MCG/DOSE AEPB USE 1 PUFF TWICE DAILY  . hydrALAZINE (APRESOLINE) 50 MG tablet TAKE ONE-HALF TABLET BY  MOUTH 3 TIMES DAILY  . levothyroxine (SYNTHROID) 50 MCG tablet TAKE ONE-HALF TABLET BY  MOUTH ONCE A DAY ON AN  EMPTY STOMACH  . losartan (COZAAR) 50 MG tablet TAKE 1 TABLET BY MOUTH  DAILY  . montelukast (SINGULAIR) 10 MG tablet TAKE 1 TABLET BY MOUTH  DAILY  . Multiple Vitamins-Minerals (PRESERVISION AREDS 2+MULTI VIT) CAPS Take 1 capsule by mouth 2 (two) times daily.   Marland Kitchen omeprazole (PRILOSEC) 20 MG capsule Take 20 mg by mouth 2 (two)  times daily before a meal.   . oxyCODONE (OXY IR/ROXICODONE) 5 MG immediate release tablet Take 1 tablet (5 mg total) by mouth every 4 (four) hours as needed for moderate pain (pain score 4-6).  Marland Kitchen sennosides-docusate sodium (SENOKOT-S) 8.6-50 MG tablet Take 1-2 tablets by mouth at bedtime.   . terbinafine (LAMISIL) 1 % cream Apply 1 application topically 2 (two) times daily as needed (toes).   . enoxaparin (LOVENOX) 40 MG/0.4ML injection Inject 0.4 mLs (40 mg total) into the skin daily for 14 days.   No facility-administered encounter medications on file as of 12/21/2019.    Surgical History: Past Surgical History:  Procedure Laterality Date  . COLONOSCOPY    . cysto    . LAPAROSCOPY    . TOTAL HIP ARTHROPLASTY Right 08/17/2019   Procedure: TOTAL HIP ARTHROPLASTY;  Surgeon: Dereck Leep, MD;  Location: [facility] ORS;  Service: Orthopedics;  Laterality: Right;    Medical History: Past Medical History:  Diagnosis Date  . Anxiety   . Arthritis   . COPD (chronic obstructive pulmonary disease) ([place])   . Dyspnea   . Gastritis   . GERD (gastroesophageal reflux disease)   . Hypertension   . Osteoporosis     Family History: Family History  Problem Relation Age of Onset  . Hypertension Mother   . Coronary artery disease Father   . Heart disease Father  Social History   Socioeconomic History  . Marital status: Single    Spouse name: Not on file  . Number of children: Not on file  . Years of education: Not on file  . Highest education level: Not on file  Occupational History  . Not on file  Tobacco Use  . Smoking status: Former Smoker    Quit date: 02/20/2001    Years since quitting: 18.8  . Smokeless tobacco: Never Used  . Tobacco comment: quit 17 years ago  Substance and Sexual Activity  . Alcohol use: Yes    Alcohol/week: 2.0 standard drinks    Types: 2 Glasses of wine per week    Comment: very rarely  . Drug use: Never  . Sexual activity: Not on file  Other Topics  Concern  . Not on file  Social History Narrative  . Not on file   Social Determinants of Health   Financial Resource Strain:   . Difficulty of Paying Living Expenses: Not on file  Food Insecurity:   . Worried About Charity fundraiser in the Last Year: Not on file  . Ran Out of Food in the Last Year: Not on file  Transportation Needs:   . Lack of Transportation (Medical): Not on file  . Lack of Transportation (Non-Medical): Not on file  Physical Activity:   . Days of Exercise per Week: Not on file  . Minutes of Exercise per Session: Not on file  Stress:   . Feeling of Stress : Not on file  Social Connections:   . Frequency of Communication with Friends and Family: Not on file  . Frequency of Social Gatherings with Friends and Family: Not on file  . Attends Religious Services: Not on file  . Active Member of Clubs or Organizations: Not on file  . Attends Archivist Meetings: Not on file  . Marital Status: Not on file  Intimate Partner Violence:   . Fear of Current or Ex-Partner: Not on file  . Emotionally Abused: Not on file  . Physically Abused: Not on file  . Sexually Abused: Not on file      Review of Systems  Constitutional: Negative for chills, fatigue and unexpected weight change.  HENT: Negative for congestion, rhinorrhea, sneezing and sore throat.   Eyes: Negative for photophobia, pain and redness.  Respiratory: Negative for cough, chest tightness and shortness of breath.   Cardiovascular: Negative for chest pain and palpitations.  Gastrointestinal: Negative for abdominal pain, constipation, diarrhea, nausea and vomiting.  Endocrine: Negative.   Genitourinary: Negative for dysuria and frequency.  Musculoskeletal: Negative for arthralgias, back pain, joint swelling and neck pain.  Skin: Negative for rash.  Allergic/Immunologic: Negative.   Neurological: Negative for tremors and numbness.  Hematological: Negative for adenopathy. Does not bruise/bleed  easily.  Psychiatric/Behavioral: Negative for behavioral problems and sleep disturbance. The patient is not nervous/anxious.     Vital Signs: BP (!) 160/88   Pulse 74   Temp (!) 96.7 F (35.9 C)   Resp 16   Ht 4\' 7"  (1.397 m)   Wt 83 lb (37.6 kg)   SpO2 97%   BMI 19.29 kg/m    Physical Exam Vitals and nursing note reviewed.  Constitutional:      General: She is not in acute distress.    Appearance: She is well-developed. She is not diaphoretic.  HENT:     Head: Normocephalic and atraumatic.     Mouth/Throat:     Pharynx: No oropharyngeal  exudate.  Eyes:     Pupils: Pupils are equal, round, and reactive to light.  Neck:     Thyroid: No thyromegaly.     Vascular: No JVD.     Trachea: No tracheal deviation.  Cardiovascular:     Rate and Rhythm: Normal rate and regular rhythm.     Heart sounds: Normal heart sounds. No murmur. No friction rub. No gallop.   Pulmonary:     Effort: Pulmonary effort is normal. No respiratory distress.     Breath sounds: Normal breath sounds. No wheezing or rales.  Chest:     Chest wall: No tenderness.  Abdominal:     Palpations: Abdomen is soft.     Tenderness: There is no abdominal tenderness. There is no guarding.  Musculoskeletal:        General: Normal range of motion.     Cervical back: Normal range of motion and neck supple.  Lymphadenopathy:     Cervical: No cervical adenopathy.  Skin:    General: Skin is warm and dry.  Neurological:     Mental Status: She is alert and oriented to person, place, and time.     Cranial Nerves: No cranial nerve deficit.  Psychiatric:        Behavior: Behavior normal.        Thought Content: Thought content normal.        Judgment: Judgment normal.    Assessment/Plan: 1. Localized edema Edema likely not from Norvasc, 3 weeks after stopping.  Will update Echo to assess pump function.  - ECHOCARDIOGRAM COMPLETE; Future  2. Essential hypertension Slightly elevated, could be cause of  intermittent headaches. However, it is improved from last visit since increasing losartan to BID.   3. Gastroesophageal reflux disease without esophagitis Continue current management.   General Counseling: josue [place] understanding of the findings of todays visit and agrees with plan of treatment. I have discussed any further diagnostic evaluation that may be needed or ordered today. We also reviewed her medications today. she has been encouraged to call the office with any questions or concerns that should arise related to todays visit.    Orders Placed This Encounter  Procedures  . ECHOCARDIOGRAM COMPLETE    No orders of the defined types were placed in this encounter.   Time spent: 25 Minutes   This patient was seen by Orson Gear AGNP-C in Collaboration with Dr Lavera Guise as a part of collaborative care agreement     Kendell Bane AGNP-C Internal medicine

## 2019-12-22 DIAGNOSIS — H35051 Retinal neovascularization, unspecified, right eye: Secondary | ICD-10-CM | POA: Diagnosis not present

## 2019-12-23 ENCOUNTER — Telehealth: Payer: Self-pay

## 2019-12-29 ENCOUNTER — Other Ambulatory Visit: Payer: Self-pay

## 2019-12-29 ENCOUNTER — Telehealth: Payer: Self-pay

## 2019-12-29 MED ORDER — LOSARTAN POTASSIUM 50 MG PO TABS: 50.0000 mg | ORAL_TABLET | Freq: Two times a day (BID) | ORAL | 1 refills | Status: DC

## 2019-12-29 NOTE — Telephone Encounter (Signed)
Pt called stating that she was worried about her blood pressure because it has been running high and wanted to confirm that she continue on the same regimen that was instructed to her last time she was seen. Pt also states that she felt a little chest pain and had onset of indigestion before the chest pain began and it came up to her throat. Pt stated that the pain sat on top her chest and did not radiate anywhere and that the pain was similar to chest pain associated with indigestion. Pt also has headaches sometimes but not as bad as they were.  Pt stated that she would not like to go to the ER. Pt was concerned because of elevated bp.  Confirmed with patient which bp meds she is taking and they are as follows: Losartan 50 mg 2 times daily Hydralazine 50 mg 1 tab in the morning after food and two half tab po daily.   Spoke with Dr. Humphrey Rolls and called pt back to clarify what acid reflux medication pt takes. Pt clarified that pt takes omeprazole 20 mg twice daily.  Notified Dr. Humphrey Rolls and advised pt per Dr. Humphrey Rolls that we should continue with the same meds and that indigestion is causing the pain in her chest. Advised pt to continue with how she is taking meds and to make sure to drink plenty of water.  I will follow up with patient tomorrow.

## 2019-12-30 ENCOUNTER — Telehealth: Payer: Self-pay

## 2019-12-30 ENCOUNTER — Other Ambulatory Visit: Payer: Self-pay

## 2019-12-30 MED ORDER — CLONAZEPAM 0.5 MG PO TABS: 0.5000 mg | ORAL_TABLET | Freq: Two times a day (BID) | ORAL | 0 refills | Status: DC | PRN

## 2019-12-30 NOTE — Telephone Encounter (Signed)
Called to follow up on patient. Pt states that she no longer has the chest pain, but her headaches are still theres. Pt states headaches are coming from increase in bp medication, pt states headaches are worse after taking meds and pt takes meds after meals, but linger throughout the day. Per Dr. Humphrey Rolls asked pt if she has sinus issues currently and pt states her nose hurts. I also clarified if headaches have gotten worse since her nose haas been hurting and she is unsure because her headaches have been there since before her nose started hurting. Spoke with Dr. Humphrey Rolls and notified pt that Dr. Humphrey Rolls does not feel comfortable changing medications without pt being seen and is worried that her bp may become too elevated. Per Dr. Humphrey Rolls also gave pt option to be referred to a cardiologist, but pt refused. Made pt an appt for tomorrow, 12/31/19, at 9:15.

## 2019-12-30 NOTE — Telephone Encounter (Signed)
Left a message confirming appt for 01/01/20

## 2019-12-31 ENCOUNTER — Ambulatory Visit (INDEPENDENT_AMBULATORY_CARE_PROVIDER_SITE_OTHER): Payer: Medicare Other | Admitting: Nurse Practitioner

## 2019-12-31 ENCOUNTER — Other Ambulatory Visit: Payer: Self-pay

## 2019-12-31 ENCOUNTER — Encounter: Payer: Self-pay | Admitting: Nurse Practitioner

## 2019-12-31 VITALS — BP 178/100 | HR 70 | Temp 97.1°F | Resp 16 | Ht <= 58 in | Wt 81.0 lb

## 2019-12-31 DIAGNOSIS — I10 Essential (primary) hypertension: Secondary | ICD-10-CM | POA: Diagnosis not present

## 2019-12-31 DIAGNOSIS — I4510 Unspecified right bundle-branch block: Secondary | ICD-10-CM

## 2019-12-31 DIAGNOSIS — G8929 Other chronic pain: Secondary | ICD-10-CM

## 2019-12-31 DIAGNOSIS — R519 Headache, unspecified: Secondary | ICD-10-CM | POA: Diagnosis not present

## 2019-12-31 MED ORDER — HYDRALAZINE HCL 50 MG PO TABS: ORAL_TABLET | ORAL | 3 refills | Status: DC

## 2019-12-31 NOTE — Progress Notes (Signed)
[facility] [place], [place] [address]  Internal MEDICINE  Office Visit Note  Patient Name: Pam Cruz  [id number]  [id number]  Date of Service: 01/06/2020   Pt is here for a sick visit.   Chief Complaint  Patient presents with  . Headache  . Hypertension     The patient is here for acute visit. Blood pressure is still elevated and patient still having headache. She admits she has not taken blood pressure medication yet today as she has not eaten. ECG was performed in the office this morning. Showing right bundle branch block with left axis. This is unchanged since the last ECG done 07/2019. She is scheduled for echocardiogram tomorrow, 01/01/2020. She has not had carotid doppler study done.      Current Medication:  Outpatient Encounter Medications as of 12/31/2019  Medication Sig  . acetaminophen (TYLENOL) 500 MG tablet Take 500 mg by mouth every 6 (six) hours as needed (pain).   . clindamycin (CLEOCIN) 150 MG capsule Take 150 mg by mouth 3 (three) times daily.  . clonazePAM (KLONOPIN) 0.5 MG tablet Take 1 tablet (0.5 mg total) by mouth 2 (two) times daily as needed. for anxiety  . desipramine (NORPRAMIN) 25 MG tablet TAKE 1 TABLET BY MOUTH  DAILY (Patient taking differently: Take 25 mg by mouth daily as needed (stomach issues). )  . fluticasone (FLONASE) 50 MCG/ACT nasal spray USE 2 SPRAYS IN EACH  NOSTRIL EVERY NIGHT AT  BEDTIME (Patient taking differently: Place 2 sprays into both nostrils at bedtime. )  . Fluticasone-Salmeterol (WIXELA INHUB) 250-50 MCG/DOSE AEPB USE 1 PUFF TWICE DAILY  . hydrALAZINE (APRESOLINE) 50 MG tablet Take 1 tablet in AM and 1/2 tablet in afternoon and evening.  Marland Kitchen levothyroxine (SYNTHROID) 50 MCG tablet TAKE ONE-HALF TABLET BY  MOUTH ONCE A DAY ON AN  EMPTY STOMACH  . losartan (COZAAR) 50 MG tablet Take 1 tablet (50 mg total) by mouth 2 (two) times daily.  . montelukast (SINGULAIR) 10 MG tablet TAKE 1 TABLET BY MOUTH   DAILY  . Multiple Vitamins-Minerals (PRESERVISION AREDS 2+MULTI VIT) CAPS Take 1 capsule by mouth 2 (two) times daily.   Marland Kitchen omeprazole (PRILOSEC) 20 MG capsule Take 20 mg by mouth 2 (two) times daily before a meal.   . oxyCODONE (OXY IR/ROXICODONE) 5 MG immediate release tablet Take 1 tablet (5 mg total) by mouth every 4 (four) hours as needed for moderate pain (pain score 4-6).  Marland Kitchen sennosides-docusate sodium (SENOKOT-S) 8.6-50 MG tablet Take 1-2 tablets by mouth at bedtime.   . terbinafine (LAMISIL) 1 % cream Apply 1 application topically 2 (two) times daily as needed (toes).   . [DISCONTINUED] hydrALAZINE (APRESOLINE) 50 MG tablet TAKE ONE-HALF TABLET BY  MOUTH 3 TIMES DAILY  . [DISCONTINUED] losartan (COZAAR) 50 MG tablet TAKE 1 TABLET BY MOUTH  DAILY  . enoxaparin (LOVENOX) 40 MG/0.4ML injection Inject 0.4 mLs (40 mg total) into the skin daily for 14 days.   No facility-administered encounter medications on file as of 12/31/2019.      Medical History: Past Medical History:  Diagnosis Date  . Anxiety   . Arthritis   . COPD (chronic obstructive pulmonary disease) ([place])   . Dyspnea   . Gastritis   . GERD (gastroesophageal reflux disease)   . Hypertension   . Osteoporosis      Today's Vitals   12/31/19 0924 12/31/19 0943  BP:  (!) 178/100  Pulse:  70  Resp: 16 16  Temp:  (!) 97.1 F (36.2 C)  SpO2:  96%  Weight:  81 lb (36.7 kg)  Height: 4\' 7"  (1.397 m) 4\' 7"  (1.397 m)   Body mass index is 18.83 kg/m.  Review of Systems  Constitutional: Positive for appetite change and fatigue. Negative for chills and unexpected weight change.  HENT: Negative for congestion, rhinorrhea, sneezing and sore throat.   Respiratory: Positive for shortness of breath. Negative for cough and chest tightness.   Cardiovascular: Positive for chest pain and palpitations.       Very elevated blood pressure.  Gastrointestinal: Positive for nausea. Negative for abdominal pain, constipation, diarrhea and  vomiting.  Musculoskeletal: Negative for arthralgias, back pain, joint swelling and neck pain.  Skin: Negative for rash.  Neurological: Positive for dizziness, weakness and headaches. Negative for tremors and numbness.  Hematological: Negative for adenopathy. Does not bruise/bleed easily.  Psychiatric/Behavioral: Negative for behavioral problems and sleep disturbance. The patient is nervous/anxious.     Physical Exam Vitals and nursing note reviewed.  Constitutional:      General: She is in acute distress.     Appearance: Normal appearance. She is well-developed. She is not diaphoretic.  HENT:     Head: Normocephalic and atraumatic.     Nose: Nose normal.     Mouth/Throat:     Pharynx: No oropharyngeal exudate.  Eyes:     Pupils: Pupils are equal, round, and reactive to light.  Neck:     Thyroid: No thyromegaly.     Vascular: No carotid bruit or JVD.     Trachea: No tracheal deviation.  Cardiovascular:     Rate and Rhythm: Normal rate and regular rhythm.     Heart sounds: Normal heart sounds. No murmur. No friction rub. No gallop.      Comments: EKG in the office showing right bundle branch block with left axis -bifascicular block Pulmonary:     Effort: Pulmonary effort is normal. No respiratory distress.     Breath sounds: Normal breath sounds. No wheezing or rales.  Chest:     Chest wall: No tenderness.  Abdominal:     Palpations: Abdomen is soft.  Musculoskeletal:        General: Normal range of motion.     Cervical back: Normal range of motion and neck supple.  Lymphadenopathy:     Cervical: No cervical adenopathy.  Skin:    General: Skin is warm and dry.  Neurological:     Mental Status: She is alert and oriented to person, place, and time.     Cranial Nerves: No cranial nerve deficit.  Psychiatric:        Attention and Perception: Attention and perception normal.        Mood and Affect: Affect normal. Mood is anxious.        Speech: Speech normal.         Behavior: Behavior normal.        Thought Content: Thought content normal.        Cognition and Memory: Cognition and memory normal.        Judgment: Judgment normal.    Assessment/Plan: 1. Essential hypertension EKG showing right bundle branch block with left axis -bifascicular block. Scheduled for echo next Friday. Will get carotid doppler for further evaluation. Increase hydralazine 50mg  tablets to 1 in AM and 1/2 tablet in afternoon and 1/2 tablet in the evening. Continue other BP medication as prescribed . - EKG 12-Lead - US Carotid Bilateral; Future - hydrALAZINE (  APRESOLINE) 50 MG tablet; Take 1 tablet in AM and 1/2 tablet in afternoon and evening.  Dispense: 135 tablet; Refill: 3  2. Right bundle branch block (RBBB) determined by electrocardiography EKG showing right bundle branch block with left axis -bifascicular block. Scheduled for echo next Friday. Will get carotid doppler for further evaluation.  3. Chronic intractable headache, unspecified headache type Carotid doppler study ordered for further evaluation.  - US Carotid Bilateral; Future  General Counseling: dalton ealy understanding of the findings of todays visit and agrees with plan of treatment. I have discussed any further diagnostic evaluation that may be needed or ordered today. We also reviewed her medications today. she has been encouraged to call the office with any questions or concerns that should arise related to todays visit.    Counseling:  Hypertension Counseling:   The following hypertensive lifestyle modification were recommended and discussed:  1. Limiting alcohol intake to less than 1 oz/day of ethanol:(24 oz of beer or 8 oz of wine or 2 oz of 100-proof whiskey). 2. Take baby ASA 81 mg daily. 3. Importance of regular aerobic exercise and losing weight. 4. Reduce dietary saturated fat and cholesterol intake for overall cardiovascular health. 5. Maintaining adequate dietary potassium, calcium, and  magnesium intake. 6. Regular monitoring of the blood pressure. 7. Reduce sodium intake to less than 100 mmol/day (less than 2.3 gm of sodium or less than 6 gm of sodium choride)   This patient was seen by Byron with Dr Lavera Guise as a part of collaborative care agreement  Orders Placed This Encounter  Procedures  . US Carotid Bilateral  . EKG 12-Lead    Meds ordered this encounter  Medications  . hydrALAZINE (APRESOLINE) 50 MG tablet    Sig: Take 1 tablet in AM and 1/2 tablet in afternoon and evening.    Dispense:  135 tablet    Refill:  3    Requesting 1 year supply    Order Specific Question:   Supervising Provider    Answer:   Lavera Guise [id number]    Time spent: 45 Minutes

## 2020-01-01 ENCOUNTER — Ambulatory Visit: Payer: Medicare Other

## 2020-01-01 DIAGNOSIS — R600 Localized edema: Secondary | ICD-10-CM

## 2020-01-06 ENCOUNTER — Other Ambulatory Visit: Payer: Self-pay

## 2020-01-06 ENCOUNTER — Telehealth: Payer: Self-pay | Admitting: Nurse Practitioner

## 2020-01-06 DIAGNOSIS — R519 Headache, unspecified: Secondary | ICD-10-CM | POA: Insufficient documentation

## 2020-01-06 DIAGNOSIS — I4510 Unspecified right bundle-branch block: Secondary | ICD-10-CM | POA: Insufficient documentation

## 2020-01-06 MED ORDER — POTASSIUM 75 MG PO TABS: ORAL_TABLET | ORAL | 0 refills | Status: DC

## 2020-01-06 MED ORDER — POTASSIUM CHLORIDE ER 10 MEQ PO TBCR: 10.0000 meq | EXTENDED_RELEASE_TABLET | Freq: Every day | ORAL | 3 refills | Status: DC

## 2020-01-06 MED ORDER — ONDANSETRON HCL 4 MG PO TABS: 4.0000 mg | ORAL_TABLET | Freq: Three times a day (TID) | ORAL | 0 refills | Status: DC | PRN

## 2020-01-06 MED ORDER — MAGNESIUM 200 MG PO TABS: ORAL_TABLET | ORAL | 1 refills | Status: DC

## 2020-01-06 NOTE — Telephone Encounter (Signed)
Potassium 75 mg is not available at pharm. Per heather sent potassium 10 meq instead.

## 2020-01-06 NOTE — Telephone Encounter (Signed)
The patient called the office stating she was feeling bad and had trouble sleeping last night, very restless. She stated she didn't know if was due to a recent medication change.She also stated she was having cramping on her legs and her usual method of taking a tablespoon full of yellow mustard did not alleviate her cramping. She also stated with taking Singulair it gave her a major headache and per patient wording stated it made her whole face hurt even her mouth and teeth. Can you advise and I will reach back out to patient? Thank you.

## 2020-01-06 NOTE — Telephone Encounter (Signed)
I only saw her the one time. I advised her she could increase the hydralazine in the afternoon, but if  it is causing other effects, she can cut it back. If singulair is giving her headache, she should hold off taking it any longer.

## 2020-01-06 NOTE — Telephone Encounter (Signed)
Per Nira Conn due to side effects advised pt to cut down on hydralazine to 1/2 tab 3 times daily. Pt was advised to stop Singulair due to headaches. Pt was advised to take magnesium and potassium for cramps with meals and rx was sent to pharmacy. Pt also stated on the phone that she was feeling nauseous. Per heather sent in zofran for nausea. Pt was advised to take meds with meals. Pt was also advised if pt does not continue to feel better to call 911 and go to emergency room.

## 2020-01-08 ENCOUNTER — Other Ambulatory Visit: Payer: Self-pay

## 2020-01-08 ENCOUNTER — Ambulatory Visit: Payer: Medicare Other

## 2020-01-08 DIAGNOSIS — I6523 Occlusion and stenosis of bilateral carotid arteries: Secondary | ICD-10-CM | POA: Diagnosis not present

## 2020-01-08 DIAGNOSIS — G8929 Other chronic pain: Secondary | ICD-10-CM

## 2020-01-11 ENCOUNTER — Ambulatory Visit: Payer: Medicare Other | Admitting: Adult Health

## 2020-01-12 ENCOUNTER — Other Ambulatory Visit: Payer: Self-pay | Admitting: Internal Medicine

## 2020-01-14 ENCOUNTER — Encounter: Payer: Self-pay | Admitting: Adult Health

## 2020-01-14 ENCOUNTER — Ambulatory Visit (INDEPENDENT_AMBULATORY_CARE_PROVIDER_SITE_OTHER): Payer: Medicare Other | Admitting: Adult Health

## 2020-01-14 DIAGNOSIS — I10 Essential (primary) hypertension: Secondary | ICD-10-CM

## 2020-01-14 DIAGNOSIS — K219 Gastro-esophageal reflux disease without esophagitis: Secondary | ICD-10-CM | POA: Diagnosis not present

## 2020-01-14 DIAGNOSIS — F411 Generalized anxiety disorder: Secondary | ICD-10-CM

## 2020-01-14 NOTE — Progress Notes (Signed)
[facility] [place], [place] [address]  Internal MEDICINE  Telephone Visit  Patient Name: Pam Cruz  [id number]  [id number]  Date of Service: 01/14/2020  I connected with the patient at 1140 by telephone and verified the patients identity using two identifiers.   I discussed the limitations, risks, security and privacy concerns of performing an evaluation and management service by telephone and the availability of in person appointments. I also discussed with the patient that there may be a patient responsible charge related to the service.  The patient expressed understanding and agrees to proceed.    Chief Complaint  Patient presents with  . Telephone Screen  . Telephone Assessment  . Headache  . Dental Pain  . Facial Pain    sinus   . Follow-up    echo     HPI  Pt seen via telephone.  Patient reports she has stopped her Flonase and Singulair because it was making her nauseated, giving her headaches. She has been having issues with her blood pressure being high. Stopping these medications seems to have helped minimally.  She continues to feel bad and have headaches.  She does not have a way to check her BP at home.  She denies any dizziness or chest pain.    Current Medication: Outpatient Encounter Medications as of 01/14/2020  Medication Sig  . acetaminophen (TYLENOL) 500 MG tablet Take 500 mg by mouth every 6 (six) hours as needed (pain).   . clindamycin (CLEOCIN) 150 MG capsule Take 150 mg by mouth 3 (three) times daily.  . clonazePAM (KLONOPIN) 0.5 MG tablet Take 1 tablet (0.5 mg total) by mouth 2 (two) times daily as needed. for anxiety  . desipramine (NORPRAMIN) 25 MG tablet TAKE 1 TABLET BY MOUTH  DAILY (Patient taking differently: Take 25 mg by mouth daily as needed (stomach issues). )  . fluticasone (FLONASE) 50 MCG/ACT nasal spray USE 2 SPRAYS IN EACH  NOSTRIL EVERY NIGHT AT  BEDTIME (Patient taking differently: Place 2 sprays into both  nostrils at bedtime. )  . Fluticasone-Salmeterol (WIXELA INHUB) 250-50 MCG/DOSE AEPB USE 1 PUFF TWICE DAILY  . hydrALAZINE (APRESOLINE) 50 MG tablet Take 1 tablet in AM and 1/2 tablet in afternoon and evening.  Marland Kitchen levothyroxine (SYNTHROID) 50 MCG tablet TAKE ONE-HALF TABLET BY  MOUTH ONCE A DAY ON AN  EMPTY STOMACH  . losartan (COZAAR) 50 MG tablet Take 1 tablet (50 mg total) by mouth 2 (two) times daily.  . Magnesium 200 MG TABS Take 1 tab po daily  . Multiple Vitamins-Minerals (PRESERVISION AREDS 2+MULTI VIT) CAPS Take 1 capsule by mouth 2 (two) times daily.   Marland Kitchen omeprazole (PRILOSEC) 20 MG capsule Take 20 mg by mouth 2 (two) times daily before a meal.   . ondansetron (ZOFRAN) 4 MG tablet Take 1 tablet (4 mg total) by mouth every 8 (eight) hours as needed for nausea or vomiting.  Marland Kitchen oxyCODONE (OXY IR/ROXICODONE) 5 MG immediate release tablet Take 1 tablet (5 mg total) by mouth every 4 (four) hours as needed for moderate pain (pain score 4-6).  . Potassium 75 MG TABS Take 1 tab po daily  . potassium chloride (KLOR-CON) 10 MEQ tablet Take 1 tablet (10 mEq total) by mouth daily.  . sennosides-docusate sodium (SENOKOT-S) 8.6-50 MG tablet Take 1-2 tablets by mouth at bedtime.   . terbinafine (LAMISIL) 1 % cream Apply 1 application topically 2 (two) times daily as needed (toes).   . enoxaparin (LOVENOX) 40  MG/0.4ML injection Inject 0.4 mLs (40 mg total) into the skin daily for 14 days.  . montelukast (SINGULAIR) 10 MG tablet TAKE 1 TABLET BY MOUTH  DAILY (Patient not taking: Reported on 01/14/2020)   No facility-administered encounter medications on file as of 01/14/2020.    Surgical History: Past Surgical History:  Procedure Laterality Date  . COLONOSCOPY    . cysto    . LAPAROSCOPY    . TOTAL HIP ARTHROPLASTY Right 08/17/2019   Procedure: TOTAL HIP ARTHROPLASTY;  Surgeon: Dereck Leep, MD;  Location: [facility] ORS;  Service: Orthopedics;  Laterality: Right;    Medical History: Past Medical  History:  Diagnosis Date  . Anxiety   . Arthritis   . COPD (chronic obstructive pulmonary disease) ([place])   . Dyspnea   . Gastritis   . GERD (gastroesophageal reflux disease)   . Hypertension   . Osteoporosis     Family History: Family History  Problem Relation Age of Onset  . Hypertension Mother   . Coronary artery disease Father   . Heart disease Father     Social History   Socioeconomic History  . Marital status: Single    Spouse name: Not on file  . Number of children: Not on file  . Years of education: Not on file  . Highest education level: Not on file  Occupational History  . Not on file  Tobacco Use  . Smoking status: Former Smoker    Quit date: 02/20/2001    Years since quitting: 18.9  . Smokeless tobacco: Never Used  . Tobacco comment: quit 17 years ago  Substance and Sexual Activity  . Alcohol use: Yes    Alcohol/week: 2.0 standard drinks    Types: 2 Glasses of wine per week    Comment: very rarely  . Drug use: Never  . Sexual activity: Not on file  Other Topics Concern  . Not on file  Social History Narrative  . Not on file   Social Determinants of Health   Financial Resource Strain:   . Difficulty of Paying Living Expenses:   Food Insecurity:   . Worried About Charity fundraiser in the Last Year:   . Arboriculturist in the Last Year:   Transportation Needs:   . Film/video editor (Medical):   Marland Kitchen Lack of Transportation (Non-Medical):   Physical Activity:   . Days of Exercise per Week:   . Minutes of Exercise per Session:   Stress:   . Feeling of Stress :   Social Connections:   . Frequency of Communication with Friends and Family:   . Frequency of Social Gatherings with Friends and Family:   . Attends Religious Services:   . Active Member of Clubs or Organizations:   . Attends Archivist Meetings:   Marland Kitchen Marital Status:   Intimate Partner Violence:   . Fear of Current or Ex-Partner:   . Emotionally Abused:   Marland Kitchen Physically  Abused:   . Sexually Abused:       Review of Systems  Constitutional: Negative for chills, fatigue and unexpected weight change.  HENT: Negative for congestion, rhinorrhea, sneezing and sore throat.   Eyes: Negative for photophobia, pain and redness.  Respiratory: Negative for cough, chest tightness and shortness of breath.   Cardiovascular: Negative for chest pain and palpitations.  Gastrointestinal: Negative for abdominal pain, constipation, diarrhea, nausea and vomiting.  Endocrine: Negative.   Genitourinary: Negative for dysuria and frequency.  Musculoskeletal: Negative for arthralgias,  back pain, joint swelling and neck pain.  Skin: Negative for rash.  Allergic/Immunologic: Negative.   Neurological: Negative for tremors and numbness.  Hematological: Negative for adenopathy. Does not bruise/bleed easily.  Psychiatric/Behavioral: Negative for behavioral problems and sleep disturbance. The patient is not nervous/anxious.     Vital Signs: There were no vitals taken for this visit.   Observation/Objective:  Well sounding, NAD noted.    Assessment/Plan: 1. Essential hypertension Discussed that she should have her bp checked regullary, especially when she feels bad.  Encouraged her to get a bp cuff for home.   2. Gastroesophageal reflux disease without esophagitis Continue current management.  3. Generalized anxiety disorder Discussed allergy medications.  She is adamant that they make her feel worse.  She will stop them at this time.    General Counseling: makhyla romine understanding of the findings of today's phone visit and agrees with plan of treatment. I have discussed any further diagnostic evaluation that may be needed or ordered today. We also reviewed her medications today. she has been encouraged to call the office with any questions or concerns that should arise related to todays visit.    No orders of the defined types were placed in this encounter.   No  orders of the defined types were placed in this encounter.   Time spent: [place] [facility] Internal medicine

## 2020-01-18 ENCOUNTER — Other Ambulatory Visit: Payer: Self-pay

## 2020-01-18 ENCOUNTER — Telehealth: Payer: Self-pay

## 2020-01-18 ENCOUNTER — Ambulatory Visit: Payer: Medicare Other | Admitting: Adult Health

## 2020-01-18 MED ORDER — METOPROLOL TARTRATE 25 MG PO TABS: 12.5000 mg | ORAL_TABLET | Freq: Every day | ORAL | 0 refills | Status: DC

## 2020-01-18 NOTE — Telephone Encounter (Signed)
As per adam advised pt we add metoprolol 25 mg take 1/2 tab daily and also cut hydralazine take 1/2 tab bid and take losartan as prescribed

## 2020-01-18 NOTE — Telephone Encounter (Signed)
lmom to call us back

## 2020-01-19 ENCOUNTER — Other Ambulatory Visit: Payer: Self-pay

## 2020-01-19 ENCOUNTER — Other Ambulatory Visit: Payer: Self-pay | Admitting: Internal Medicine

## 2020-01-19 ENCOUNTER — Telehealth: Payer: Self-pay

## 2020-01-19 ENCOUNTER — Observation Stay
Admission: EM | Admit: 2020-01-19 | Discharge: 2020-01-20 | Disposition: A | Discharge status: Home / Self Care | Payer: Medicare Other | Attending: Internal Medicine | Admitting: Internal Medicine

## 2020-01-19 DIAGNOSIS — R112 Nausea with vomiting, unspecified: Secondary | ICD-10-CM | POA: Diagnosis present

## 2020-01-19 DIAGNOSIS — E871 Hypo-osmolality and hyponatremia: Secondary | ICD-10-CM | POA: Insufficient documentation

## 2020-01-19 DIAGNOSIS — Z743 Need for continuous supervision: Secondary | ICD-10-CM | POA: Diagnosis not present

## 2020-01-19 DIAGNOSIS — R531 Weakness: Secondary | ICD-10-CM

## 2020-01-19 DIAGNOSIS — Z20822 Contact with and (suspected) exposure to covid-19: Secondary | ICD-10-CM | POA: Diagnosis not present

## 2020-01-19 DIAGNOSIS — F419 Anxiety disorder, unspecified: Secondary | ICD-10-CM | POA: Diagnosis not present

## 2020-01-19 DIAGNOSIS — Z7902 Long term (current) use of antithrombotics/antiplatelets: Secondary | ICD-10-CM | POA: Diagnosis not present

## 2020-01-19 DIAGNOSIS — E039 Hypothyroidism, unspecified: Secondary | ICD-10-CM

## 2020-01-19 DIAGNOSIS — Z79899 Other long term (current) drug therapy: Secondary | ICD-10-CM | POA: Diagnosis not present

## 2020-01-19 DIAGNOSIS — K219 Gastro-esophageal reflux disease without esophagitis: Secondary | ICD-10-CM | POA: Diagnosis not present

## 2020-01-19 DIAGNOSIS — E860 Dehydration: Secondary | ICD-10-CM

## 2020-01-19 DIAGNOSIS — I10 Essential (primary) hypertension: Secondary | ICD-10-CM | POA: Diagnosis not present

## 2020-01-19 DIAGNOSIS — Z96641 Presence of right artificial hip joint: Secondary | ICD-10-CM | POA: Diagnosis not present

## 2020-01-19 DIAGNOSIS — J449 Chronic obstructive pulmonary disease, unspecified: Secondary | ICD-10-CM | POA: Diagnosis not present

## 2020-01-19 DIAGNOSIS — Z87891 Personal history of nicotine dependence: Secondary | ICD-10-CM | POA: Diagnosis not present

## 2020-01-19 DIAGNOSIS — K529 Noninfective gastroenteritis and colitis, unspecified: Principal | ICD-10-CM | POA: Insufficient documentation

## 2020-01-19 DIAGNOSIS — Z7951 Long term (current) use of inhaled steroids: Secondary | ICD-10-CM | POA: Insufficient documentation

## 2020-01-19 DIAGNOSIS — E878 Other disorders of electrolyte and fluid balance, not elsewhere classified: Secondary | ICD-10-CM | POA: Diagnosis not present

## 2020-01-19 DIAGNOSIS — R197 Diarrhea, unspecified: Secondary | ICD-10-CM | POA: Diagnosis not present

## 2020-01-19 LAB — MAGNESIUM: Magnesium: 2 mg/dL (ref 1.7–2.4)

## 2020-01-19 LAB — CBC
HCT: 38.4 % (ref 36.0–46.0)
Hemoglobin: 12.7 g/dL (ref 12.0–15.0)
MCH: 28.6 pg (ref 26.0–34.0)
MCHC: 33.1 g/dL (ref 30.0–36.0)
MCV: 86.5 fL (ref 80.0–100.0)
Platelets: 320 10*3/uL (ref 150–400)
RBC: 4.44 MIL/uL (ref 3.87–5.11)
RDW: 14.5 % (ref 11.5–15.5)
WBC: 7.3 10*3/uL (ref 4.0–10.5)
nRBC: 0 % (ref 0.0–0.2)

## 2020-01-19 LAB — COMPREHENSIVE METABOLIC PANEL
ALT: 20 U/L (ref 0–44)
AST: 33 U/L (ref 15–41)
Albumin: 4.6 g/dL (ref 3.5–5.0)
Alkaline Phosphatase: 93 U/L (ref 38–126)
Anion gap: 12 (ref 5–15)
BUN: 14 mg/dL (ref 8–23)
CO2: 24 mmol/L (ref 22–32)
Calcium: 9.7 mg/dL (ref 8.9–10.3)
Chloride: 89 mmol/L — ABNORMAL LOW (ref 98–111)
Creatinine, Ser: 0.56 mg/dL (ref 0.44–1.00)
GFR calc Af Amer: >60 mL/min (ref >60)
GFR calc non Af Amer: >60 mL/min (ref >60)
Glucose, Bld: 135 mg/dL — ABNORMAL HIGH (ref 70–99)
Potassium: 4.1 mmol/L (ref 3.5–5.1)
Sodium: 125 mmol/L — ABNORMAL LOW (ref 135–145)
Total Bilirubin: 1 mg/dL (ref 0.3–1.2)
Total Protein: 7.3 g/dL (ref 6.5–8.1)

## 2020-01-19 LAB — URINALYSIS, COMPLETE (UACMP) WITH MICROSCOPIC
Bacteria, UA: NONE SEEN
Bilirubin Urine: NEGATIVE
Glucose, UA: NEGATIVE mg/dL
Hgb urine dipstick: NEGATIVE
Ketones, ur: 5 mg/dL — AB
Leukocytes,Ua: NEGATIVE
Nitrite: NEGATIVE
Protein, ur: 100 mg/dL — AB
Specific Gravity, Urine: 1.01 (ref 1.005–1.030)
pH: 9 — ABNORMAL HIGH (ref 5.0–8.0)

## 2020-01-19 LAB — OSMOLALITY: Osmolality: 264 mOsm/kg — ABNORMAL LOW (ref 275–295)

## 2020-01-19 LAB — TROPONIN I (HIGH SENSITIVITY)
Troponin I (High Sensitivity): 8 ng/L (ref <18)
Troponin I (High Sensitivity): 9 ng/L (ref <18)

## 2020-01-19 LAB — SODIUM, URINE, RANDOM: Sodium, Ur: 109 mmol/L

## 2020-01-19 LAB — OSMOLALITY, URINE: Osmolality, Ur: 271 mOsm/kg — ABNORMAL LOW (ref 300–900)

## 2020-01-19 LAB — PHOSPHORUS: Phosphorus: 2.9 mg/dL (ref 2.5–4.6)

## 2020-01-19 LAB — LIPASE, BLOOD: Lipase: 30 U/L (ref 11–51)

## 2020-01-19 MED ORDER — POTASSIUM CHLORIDE IN NACL 20-0.9 MEQ/L-% IV SOLN
INTRAVENOUS | Status: AC
  Filled 2020-01-19: qty 1000

## 2020-01-19 MED ORDER — HYDRALAZINE HCL 50 MG PO TABS
25.0000 mg | ORAL_TABLET | Freq: Once | ORAL | Status: AC
  Administered 2020-01-19: 25 mg via ORAL
  Filled 2020-01-19: qty 1

## 2020-01-19 MED ORDER — LOSARTAN POTASSIUM 50 MG PO TABS: 50.0000 mg | ORAL_TABLET | Freq: Two times a day (BID) | ORAL | Status: DC

## 2020-01-19 MED ORDER — SODIUM CHLORIDE 0.9 % IV SOLN
Freq: Once | INTRAVENOUS | Status: AC
  Administered 2020-01-19: 1000 mL via INTRAVENOUS

## 2020-01-19 MED ORDER — PANTOPRAZOLE SODIUM 40 MG PO TBEC
40.0000 mg | DELAYED_RELEASE_TABLET | Freq: Every day | ORAL | Status: DC
  Administered 2020-01-20 (×2): 40 mg via ORAL
  Filled 2020-01-19 (×2): qty 1

## 2020-01-19 MED ORDER — SODIUM CHLORIDE 0.9 % IV BOLUS
1000.0000 mL | Freq: Once | INTRAVENOUS | Status: AC
  Administered 2020-01-19: 1000 mL via INTRAVENOUS

## 2020-01-19 MED ORDER — HYDRALAZINE HCL 50 MG PO TABS
50.0000 mg | ORAL_TABLET | Freq: Three times a day (TID) | ORAL | Status: DC
  Administered 2020-01-20 (×3): 50 mg via ORAL
  Filled 2020-01-19 (×3): qty 1

## 2020-01-19 MED ORDER — ACETAMINOPHEN 650 MG RE SUPP: 650.0000 mg | Freq: Four times a day (QID) | RECTAL | Status: DC | PRN

## 2020-01-19 MED ORDER — ACETAMINOPHEN 325 MG PO TABS
650.0000 mg | ORAL_TABLET | Freq: Four times a day (QID) | ORAL | Status: DC | PRN
  Administered 2020-01-20: 650 mg via ORAL
  Filled 2020-01-19: qty 2

## 2020-01-19 MED ORDER — ONDANSETRON 4 MG PO TBDP
4.0000 mg | ORAL_TABLET | Freq: Once | ORAL | Status: AC
  Administered 2020-01-19: 4 mg via ORAL
  Filled 2020-01-19: qty 1

## 2020-01-19 MED ORDER — LEVOTHYROXINE SODIUM 25 MCG PO TABS
25.0000 ug | ORAL_TABLET | Freq: Every day | ORAL | Status: DC
  Administered 2020-01-20: 25 ug via ORAL
  Filled 2020-01-19: qty 1

## 2020-01-19 MED ORDER — ONDANSETRON HCL 4 MG PO TABS: 4.0000 mg | ORAL_TABLET | Freq: Four times a day (QID) | ORAL | Status: DC | PRN

## 2020-01-19 MED ORDER — ONDANSETRON HCL 4 MG/2ML IJ SOLN: 4.0000 mg | Freq: Four times a day (QID) | INTRAMUSCULAR | Status: DC | PRN

## 2020-01-19 MED ORDER — LOSARTAN POTASSIUM 50 MG PO TABS
50.0000 mg | ORAL_TABLET | Freq: Once | ORAL | Status: AC
  Administered 2020-01-19: 50 mg via ORAL
  Filled 2020-01-19: qty 1

## 2020-01-19 MED ORDER — CLONAZEPAM 0.5 MG PO TABS
0.5000 mg | ORAL_TABLET | Freq: Two times a day (BID) | ORAL | Status: DC | PRN
  Administered 2020-01-20: 0.5 mg via ORAL
  Filled 2020-01-19: qty 1

## 2020-01-19 MED ORDER — ALBUTEROL SULFATE (2.5 MG/3ML) 0.083% IN NEBU: 2.5000 mg | INHALATION_SOLUTION | RESPIRATORY_TRACT | Status: DC | PRN

## 2020-01-19 MED ORDER — TERBINAFINE HCL 1 % EX CREA
1.0000 "application " | TOPICAL_CREAM | Freq: Two times a day (BID) | CUTANEOUS | Status: DC | PRN
  Filled 2020-01-19: qty 12

## 2020-01-19 MED ORDER — ENOXAPARIN SODIUM 30 MG/0.3ML ~~LOC~~ SOLN
30.0000 mg | SUBCUTANEOUS | Status: DC
  Administered 2020-01-20: 30 mg via SUBCUTANEOUS
  Filled 2020-01-19 (×2): qty 0.3

## 2020-01-19 MED ORDER — LOSARTAN POTASSIUM 50 MG PO TABS
50.0000 mg | ORAL_TABLET | Freq: Two times a day (BID) | ORAL | Status: DC
  Administered 2020-01-20: 50 mg via ORAL
  Filled 2020-01-19: qty 1

## 2020-01-19 NOTE — Telephone Encounter (Signed)
Pt called that she having nausea and urinated all light she is fatigue and headache as per adam advised her to take zofran and drink some fluids and she is holding fluids and she is not need to go to ED may be she dehydrated due to pt she ate last night chicken and also advised her call her GI meanwhile to follow up and I advised pt that I will call her back in 30 minute to see how she is doing

## 2020-01-19 NOTE — ED Notes (Signed)
Patient given cranberry juice per Dr. Joan Mayans

## 2020-01-19 NOTE — Telephone Encounter (Signed)
Pt called back to follow up and stated that she tried to contact her GI doctor due to having stomach issues and having bowel movements all day. Pt states GI nurse said that GI nurse will have to message the provider and see what they can do for pt. Pt states she is still not feeling well, continued cramps in legs, all day bowel movements, severe headaches and feeling very weak. From previous conversation pt again was advised to go to ER due to multiple issues. Pt also spoke with nimisha and was advised to do the same.

## 2020-01-19 NOTE — ED Triage Notes (Addendum)
Reports that she ate fried chicken yesterday and began having abdominal pain. Reports weakness, too weak to get up. Pt states she had emesis a few times and diarrhea. Pt has hx of constipation. Pt has not taken BP medication today, states that she has hx of hypertension.

## 2020-01-19 NOTE — ED Notes (Signed)
Pt actively vomiting in the [place], [place] notified.

## 2020-01-19 NOTE — ED Triage Notes (Signed)
Pt comes into the ED via EMS from home with c/o increased urinary frequency , nausea with one episode of vomiting and diarrhea today,

## 2020-01-19 NOTE — ED Provider Notes (Addendum)
[facility] Emergency Department Provider Note  ____________________________________________   First MD Initiated Contact with Patient 01/19/20 1729     (approximate)  I have reviewed the triage vital signs and the nursing notes.  History  Chief Complaint Weakness    HPI Pam Cruz is a 85 y.o. female PMHx as below who presents to the emergency department for vomiting, diarrhea, polyuria, concern for dehydration.  Patient states she ate fried chicken yesterday and thereafter developed generalized abdominal pain and cramping.  She had several episodes of emesis and several episodes of diarrhea today.  Also notes urinary frequency, but no dysuria or malodorous urine.  She feels generally weak and tired.  She has not been able to take her medications today or take it a significant PO.  Denies any pain at present.   Past Medical Hx Past Medical History:  Diagnosis Date  . Anxiety   . Arthritis   . COPD (chronic obstructive pulmonary disease) ([place])   . Dyspnea   . Gastritis   . GERD (gastroesophageal reflux disease)   . Hypertension   . Osteoporosis     Problem List Patient Active Problem List   Diagnosis Date Noted  . Right bundle branch block (RBBB) determined by electrocardiography 01/06/2020  . Chronic intractable headache 01/06/2020  . H/O total hip arthroplasty 08/17/2019  . Essential hypertension 06/24/2018  . GERD (gastroesophageal reflux disease) 12/10/2014  . History of IBS 12/10/2014    Past Surgical Hx Past Surgical History:  Procedure Laterality Date  . COLONOSCOPY    . cysto    . LAPAROSCOPY    . TOTAL HIP ARTHROPLASTY Right 08/17/2019   Procedure: TOTAL HIP ARTHROPLASTY;  Surgeon: Dereck Leep, MD;  Location: [facility] ORS;  Service: Orthopedics;  Laterality: Right;    Medications Prior to Admission medications   Medication Sig Start Date End Date Taking? Authorizing Provider  acetaminophen (TYLENOL) 500 MG tablet Take 500  mg by mouth every 6 (six) hours as needed (pain).     [provider]  clindamycin (CLEOCIN) 150 MG capsule Take 150 mg by mouth 3 (three) times daily.    [provider]  clonazePAM (KLONOPIN) 0.5 MG tablet Take 1 tablet (0.5 mg total) by mouth 2 (two) times daily as needed. for anxiety 12/30/19   Kendell Bane, NP  enoxaparin (LOVENOX) 40 MG/0.4ML injection Inject 0.4 mLs (40 mg total) into the skin daily for 14 days. 08/19/19 09/02/19  Fausto Skillern, PA-C  Fluticasone-Salmeterol [facility] INHUB) 250-50 MCG/DOSE AEPB USE 1 PUFF TWICE DAILY 08/27/19   Lavera Guise, MD  hydrALAZINE (APRESOLINE) 50 MG tablet Take 1 tablet in AM and 1/2 tablet in afternoon and evening. 12/31/19   Ronnell Freshwater, NP  levothyroxine (SYNTHROID) 50 MCG tablet TAKE ONE-HALF TABLET BY  MOUTH ONCE A DAY ON AN  EMPTY STOMACH 10/14/19   Lavera Guise, MD  losartan (COZAAR) 50 MG tablet Take 1 tablet (50 mg total) by mouth 2 (two) times daily. 12/29/19   Kendell Bane, NP  Magnesium 200 MG TABS Take 1 tab po daily 01/06/20   Ronnell Freshwater, NP  metoprolol tartrate (LOPRESSOR) 25 MG tablet TAKE 1/2 TABLET(12.5 MG) BY MOUTH DAILY 01/19/20   Lavera Guise, MD  Multiple Vitamins-Minerals (PRESERVISION AREDS 2+MULTI VIT) CAPS Take 1 capsule by mouth 2 (two) times daily.     [provider]  omeprazole (PRILOSEC) 20 MG capsule Take 20 mg by mouth 2 (two) times daily  before a meal.     [provider]  ondansetron (ZOFRAN) 4 MG tablet Take 1 tablet (4 mg total) by mouth every 8 (eight) hours as needed for nausea or vomiting. 01/06/20   Ronnell Freshwater, NP  oxyCODONE (OXY IR/ROXICODONE) 5 MG immediate release tablet Take 1 tablet (5 mg total) by mouth every 4 (four) hours as needed for moderate pain (pain score 4-6). 08/19/19   Fausto Skillern, PA-C  potassium chloride (KLOR-CON) 10 MEQ tablet Take 1 tablet (10 mEq total) by mouth daily. 01/06/20   Ronnell Freshwater, NP  sennosides-docusate  sodium (SENOKOT-S) 8.6-50 MG tablet Take 1-2 tablets by mouth at bedtime.     [provider]  terbinafine (LAMISIL) 1 % cream Apply 1 application topically 2 (two) times daily as needed (toes).     [provider]    Allergies Ambien [zolpidem tartrate], Amoxicillin, Codeine, Erythromycin, Penicillins, Reglan [metoclopramide], and Sulfa antibiotics  Family Hx Family History  Problem Relation Age of Onset  . Hypertension Mother   . Coronary artery disease Father   . Heart disease Father     Social Hx Social History   Tobacco Use  . Smoking status: Former Smoker    Quit date: 02/20/2001    Years since quitting: 18.9  . Smokeless tobacco: Never Used  . Tobacco comment: quit 17 years ago  Substance Use Topics  . Alcohol use: Yes    Alcohol/week: 2.0 standard drinks    Types: 2 Glasses of wine per week    Comment: very rarely  . Drug use: Never     Review of Systems  Constitutional: Negative for fever. Negative for chills. Eyes: Negative for visual changes. ENT: Negative for sore throat. Cardiovascular: Negative for chest pain. Respiratory: Negative for shortness of breath. Gastrointestinal: Positive for vomiting and diarrhea. Genitourinary: Negative for dysuria.  Positive for polyuria. Musculoskeletal: Negative for leg swelling. Skin: Negative for rash. Neurological: Negative for headaches.   Physical Exam  Vital Signs: ED Triage Vitals  Enc Vitals Group     BP 01/19/20 1532 (!) 179/110     Pulse Rate 01/19/20 1532 77     Resp 01/19/20 1532 18     Temp 01/19/20 1532 97.8 F (36.6 C)     Temp Source 01/19/20 1532 Oral     SpO2 01/19/20 1532 100 %     Weight 01/19/20 1533 79 lb 5.9 oz (36 kg)     Height 01/19/20 1533 4\' 7"  (1.397 m)     Head Circumference --      Peak Flow --      Pain Score 01/19/20 1533 0     Pain Loc --      Pain Edu? --      Excl. in [place]? --     Constitutional: Alert and oriented.  Fatigued appearing. Head:  Normocephalic. Atraumatic. Eyes: Conjunctivae clear. Sclera anicteric. Pupils equal and symmetric. Nose: No masses or lesions. No congestion or rhinorrhea. Mouth/Throat: Wearing mask.  Neck: No stridor. Trachea midline.  Cardiovascular: Normal rate, regular rhythm. Extremities well perfused. Respiratory: Normal respiratory effort.  Lungs CTAB. Gastrointestinal: Soft. Non-distended. Non-tender.  Genitourinary: Deferred. Musculoskeletal: No lower extremity edema. No deformities. Neurologic:  Normal speech and language. No gross focal or lateralizing neurologic deficits are appreciated.  Skin: Skin is warm, dry and intact. No rash noted. Psychiatric: Mood and affect are appropriate for situation.  EKG  Personally reviewed and interpreted by myself.   Date: 01/19/2020 Time: 1538 Rate: 70  Rhythm: Sinus Axis: Leftward Intervals: RBBB therefore prolonged QRS No STEMI    Radiology  N/A   Procedures  Procedure(s) performed (including critical care):  .Critical Care Performed by: Lilia Pro., MD Authorized by: Lilia Pro., MD   Critical care provider statement:    Critical care time (minutes):  35   Critical care was necessary to treat or prevent imminent or life-threatening deterioration of the following conditions: electrolyte abnormality.   Critical care was time spent personally by me on the following activities:  Discussions with consultants, evaluation of patient's response to treatment, examination of patient, ordering and performing treatments and interventions, ordering and review of laboratory studies, ordering and review of radiographic studies, pulse oximetry, re-evaluation of patient's condition, obtaining history from patient or surrogate and review of old charts    Initial Impression / Assessment and Plan / MDM / ED Course  85 y.o. female who presents to the ED for vomiting, diarrhea, polyuria and concern for dehydration  Ddx: dehydration, electrolyte  abnormality, gastroenteritis, food poisoning, UTI  Will plan for labs, EKG, fluids and reassess. Abdominal exam generally reassuring at this time, will hold on emergent imaging.   Urine negative for infection.  CMP notable for hyponatremia (125) and hypochloremia (89), fairly significant compared to her normal previously. Received 1 L NS bolus and will start on gentle rate to avoid rapid overcorrection. Normal MS, no seizure activity. Unable to take significant PO in the emergency department.  As such, will discuss with hospitalist for observation for continued fluids and electrolyte correction. Given her home dose of losartan and hydralazine for her hypertension and will CTM.  _______________________________   As part of my medical decision making I have reviewed available labs, radiology tests, reviewed old records/chart review, obtained additional history from family at bedside.     Final Clinical Impression(s) / ED Diagnosis  Final diagnoses:  Dehydration  Generalized weakness  Hyponatremia  Hypochloremia     Note:  This document was prepared using Dragon voice recognition software and may include unintentional dictation errors.     Lilia Pro., MD 01/19/20 2208

## 2020-01-19 NOTE — ED Notes (Signed)
Pt given mustard per request for leg cramps. Family member has gone home. SST drawn and sent to lab. Urine cannister emptied. Pt output was 673ml. New chux placed under pt. Lights out and pt attempting to rest.

## 2020-01-19 NOTE — ED Notes (Signed)
Pt able to ambulate to restroom with standby assistance. Repositioned in bed and covered with warm blanket.

## 2020-01-19 NOTE — H&P (Signed)
History and Physical    Pam Cruz [id number] DOB: [birth year] DOA: 01/19/2020  PCP: Lavera Guise, MD  Patient coming from: Home  I have personally briefly reviewed patient's old medical records in [place]  Chief Complaint: Nausea, vomiting, diarrhea  HPI: Pam Cruz is a 85 y.o. female with medical history significant for COPD, hypertension, hypothyroidism, RBBB, GERD, IBS, OA s/p right hip replacement, and anxiety who presents to the ED for evaluation of nausea, vomiting, and diarrhea.  Patient states she was in her usual state of health until last night.  She had a large meal from [place] fried chicken.  She states that she thinks she ate too much.  Last night she began having nausea with vomiting.  Emesis consisted of food content.  She initially had some cramping abdominal pain which is resolved.  She says she had a very large volume of loose stool early this morning.  She did not see any blood in her emesis or stool.  She has not seen any dark black tarry stool.  She reports having increased urinary frequency.  She denies any subjective fevers, chills, diaphoresis, chest pain, dyspnea, cough, dysuria.  She is currently complaining of cramping pain in her legs which she says has been an ongoing issue for years.  ED Course:  Initial vitals showed BP 179/110, pulse 77, RR 18, temp 97.8 Fahrenheit, SPO2 100% on room air.  Labs are notable for sodium 125, potassium 4.1, chloride 89, bicarb 24, BUN 14, creatinine 0.56, serum glucose 135, LFTs within normal limits, lipase 30, WBC 7.3, hemoglobin 12.7, platelets 320,000, magnesium 2.0, phosphorus 2.9, high-sensitivity troponin I 8 >> 9.  Urinalysis shows negative nitrates, negative leukocytes, 0-5 RBCs and WBCs/hpf, no bacteria microscopy.  SARS-CoV-2 PCR test is collected and pending.  Patient was given 1 L normal saline, Zofran, losartan, and hydralazine.  The hospitalist service was consulted to me for further  evaluation management.  Review of Systems: All systems reviewed and are negative except as documented in history of present illness above.   Past Medical History:  Diagnosis Date  . Anxiety   . Arthritis   . COPD (chronic obstructive pulmonary disease) ([place])   . Dyspnea   . Gastritis   . GERD (gastroesophageal reflux disease)   . Hypertension   . Osteoporosis     Past Surgical History:  Procedure Laterality Date  . COLONOSCOPY    . cysto    . LAPAROSCOPY    . TOTAL HIP ARTHROPLASTY Right 08/17/2019   Procedure: TOTAL HIP ARTHROPLASTY;  Surgeon: Dereck Leep, MD;  Location: [facility] ORS;  Service: Orthopedics;  Laterality: Right;    Social History:  reports that she quit smoking about 18 years ago. She has never used smokeless tobacco. She reports current alcohol use of about 2.0 standard drinks of alcohol per week. She reports that she does not use drugs.  Allergies  Allergen Reactions  . Ambien [Zolpidem Tartrate]     Pt unsure   . Amoxicillin Nausea And Vomiting  . Codeine     Other reaction(s): Abdominal Pain  . Erythromycin Nausea And Vomiting  . Penicillins Nausea And Vomiting  . Reglan [Metoclopramide] Nausea And Vomiting  . Sulfa Antibiotics Nausea And Vomiting    Family History  Problem Relation Age of Onset  . Hypertension Mother   . Coronary artery disease Father   . Heart disease Father      Prior to Admission medications   Medication Sig Start Date  End Date Taking? Authorizing Provider  acetaminophen (TYLENOL) 500 MG tablet Take 500 mg by mouth every 6 (six) hours as needed (pain).    Yes [provider]  clindamycin (CLEOCIN) 150 MG capsule Take 150 mg by mouth 3 (three) times daily.   Yes [provider]  clonazePAM (KLONOPIN) 0.5 MG tablet Take 1 tablet (0.5 mg total) by mouth 2 (two) times daily as needed. for anxiety 12/30/19  Yes [place], Audie Clear, NP  enoxaparin (LOVENOX) 40 MG/0.4ML injection Inject 40 mg into the skin every 14  (fourteen) days.   Yes [provider]  Fluticasone-Salmeterol [facility] INHUB) 250-50 MCG/DOSE AEPB USE 1 PUFF TWICE DAILY 08/27/19  Yes Lavera Guise, MD  hydrALAZINE (APRESOLINE) 50 MG tablet Take 1 tablet in AM and 1/2 tablet in afternoon and evening. 12/31/19  Yes Ronnell Freshwater, NP  levothyroxine (SYNTHROID) 50 MCG tablet TAKE ONE-HALF TABLET BY  MOUTH ONCE A DAY ON AN  EMPTY STOMACH 10/14/19  Yes Lavera Guise, MD  losartan (COZAAR) 50 MG tablet Take 1 tablet (50 mg total) by mouth 2 (two) times daily. 12/29/19  Yes Kendell Bane, NP  Magnesium 200 MG TABS Take 1 tab po daily 01/06/20  Yes Boscia, Heather E, NP  montelukast (SINGULAIR) 10 MG tablet Take 10 mg by mouth at bedtime.   Yes [provider]  Multiple Vitamins-Minerals (PRESERVISION AREDS 2+MULTI VIT) CAPS Take 1 capsule by mouth 2 (two) times daily.    Yes [provider]  omeprazole (PRILOSEC) 20 MG capsule Take 20 mg by mouth 2 (two) times daily before a meal.    Yes [provider]  ondansetron (ZOFRAN) 4 MG tablet Take 1 tablet (4 mg total) by mouth every 8 (eight) hours as needed for nausea or vomiting. 01/06/20  Yes Boscia, Heather E, NP  oxyCODONE (OXY IR/ROXICODONE) 5 MG immediate release tablet Take 1 tablet (5 mg total) by mouth every 4 (four) hours as needed for moderate pain (pain score 4-6). 08/19/19  Yes Tamala Julian B, PA-C  potassium chloride (KLOR-CON) 10 MEQ tablet Take 1 tablet (10 mEq total) by mouth daily. 01/06/20  Yes Boscia, Greer Ee, NP  sennosides-docusate sodium (SENOKOT-S) 8.6-50 MG tablet Take 1-2 tablets by mouth at bedtime.    Yes [provider]  terbinafine (LAMISIL) 1 % cream Apply 1 application topically 2 (two) times daily as needed (toes).    Yes [provider]    Physical Exam: Vitals:   01/19/20 1533 01/19/20 1730 01/19/20 1800 01/19/20 1830  BP:  (!) 215/109 (!) 201/101 (!) 210/97  Pulse:      Resp:  13 (!) 35 (!) 22  Temp:        TempSrc:      SpO2:      Weight: 36 kg     Height: 4\' 7"  (1.397 m)      Constitutional: Resting supine in bed, NAD, anxious Eyes: PERRL, lids and conjunctivae normal ENMT: Mucous membranes are dry. Posterior pharynx clear of any exudate or lesions.Normal dentition.  Neck: normal, supple, no masses. Respiratory: clear to auscultation bilaterally, no wheezing, no crackles. Normal respiratory effort. No accessory muscle use.  Cardiovascular: Regular rate and rhythm, no murmurs / rubs / gallops. No extremity edema. 2+ pedal pulses. Abdomen: no tenderness, no masses palpated. No hepatosplenomegaly. Bowel sounds positive.  Musculoskeletal: no clubbing / cyanosis. No joint deformity upper and lower extremities. Good ROM, no contractures. Normal muscle tone.  Skin: no rashes, lesions, ulcers.  No induration Neurologic: CN 2-12 grossly intact. Sensation intact, Strength 5/5 in all 4.  Psychiatric: Normal judgment and insight. Alert and oriented x 3.  Anxious mood.     Labs on Admission: I have personally reviewed following labs and imaging studies  CBC: Recent Labs  Lab 01/19/20 1545  WBC 7.3  HGB 12.7  HCT 38.4  MCV 86.5  PLT 99991111   Basic Metabolic Panel: Recent Labs  Lab 01/19/20 1545  NA 125*  K 4.1  CL 89*  CO2 24  GLUCOSE 135*  BUN 14  CREATININE 0.56  CALCIUM 9.7  MG 2.0  PHOS 2.9   GFR: Estimated Creatinine Clearance: 29.1 mL/min (by C-G formula based on SCr of 0.56 mg/dL). Liver Function Tests: Recent Labs  Lab 01/19/20 1545  AST 33  ALT 20  ALKPHOS 93  BILITOT 1.0  PROT 7.3  ALBUMIN 4.6   Recent Labs  Lab 01/19/20 1545  LIPASE 30   No results for input(s): AMMONIA in the last 168 hours. Coagulation Profile: No results for input(s): INR, PROTIME in the last 168 hours. Cardiac Enzymes: No results for input(s): CKTOTAL, CKMB, CKMBINDEX, TROPONINI in the last 168 hours. BNP (last 3 results) No results for input(s): PROBNP in the last 8760  hours. HbA1C: No results for input(s): HGBA1C in the last 72 hours. CBG: No results for input(s): GLUCAP in the last 168 hours. Lipid Profile: No results for input(s): CHOL, HDL, LDLCALC, TRIG, CHOLHDL, LDLDIRECT in the last 72 hours. Thyroid Function Tests: No results for input(s): TSH, T4TOTAL, FREET4, T3FREE, THYROIDAB in the last 72 hours. Anemia Panel: No results for input(s): VITAMINB12, FOLATE, FERRITIN, TIBC, IRON, RETICCTPCT in the last 72 hours. Urine analysis:    Component Value Date/Time   COLORURINE STRAW (A) 01/19/2020 1652   APPEARANCEUR CLEAR (A) 01/19/2020 1652   APPEARANCEUR Clear 03/10/2019 1132   LABSPEC 1.010 01/19/2020 1652   LABSPEC 1.010 05/03/2014 2105   PHURINE 9.0 (H) 01/19/2020 1652   GLUCOSEU NEGATIVE 01/19/2020 1652   GLUCOSEU Negative 05/03/2014 2105   HGBUR NEGATIVE 01/19/2020 [place] 01/19/2020 1652   BILIRUBINUR Negative 03/10/2019 1132   BILIRUBINUR Negative 05/03/2014 2105   KETONESUR 5 (A) 01/19/2020 1652   PROTEINUR 100 (A) 01/19/2020 1652   NITRITE NEGATIVE 01/19/2020 1652   LEUKOCYTESUR NEGATIVE 01/19/2020 1652   LEUKOCYTESUR Negative 05/03/2014 2105    Radiological Exams on Admission: No results found.  EKG: Independently reviewed. Sinus rhythm, RBBB, LAD, motion artifact.  Not significantly changed when compared to prior.  Assessment/Plan Principal Problem:   Gastroenteritis Active Problems:   GERD (gastroesophageal reflux disease)   Essential hypertension   Hyponatremia   COPD (chronic obstructive pulmonary disease) ([place])   Anxiety   Hypothyroidism  Pam Cruz is a 85 y.o. female with medical history significant for COPD, hypertension, hypothyroidism, RBBB, GERD, IBS, OA s/p right hip replacement, and anxiety who is admitted with gastroenteritis.  Gastroenteritis: Likely due to food she ate last night.  Having continued intermittent nausea and vomiting.  Had large volume loose stool at home but no  further episodes while in the ED.  She is not able to maintain adequate oral intake. -Continue supportive care with IV fluid resuscitation overnight -Antiemetics as needed -If having recurrent and frequent diarrhea consider obtain C. difficile/GI pathogen panel  Hyponatremia: Likely hypovolemic from GI losses.  Continue IV fluid hydration overnight and repeat labs in a.m.  Hypertension: BP elevated on arrival as she was unable to take her  home antihypertensives orally.  Restart home losartan and hydralazine.  COPD: Chronic and stable.  Continue albuterol inhaler as needed.  Hypothyroidism: Continue Synthroid.  GERD: Continue PPI.  Anxiety: Resume as needed Klonopin twice daily.  DVT prophylaxis: Lovenox Code Status: Full code, confirmed with patient Family Communication: Discussed with patient, she has discussed with family Disposition Plan: From home, likely discharge to home once serum sodium improved and able to maintain adequate oral intake. Consults called: None Admission status: Observation   Zada Finders MD [facility]  If 7PM-7AM, please contact night-coverage [email or web address]  01/19/2020, 8:19 PM

## 2020-01-20 ENCOUNTER — Other Ambulatory Visit: Payer: Self-pay

## 2020-01-20 ENCOUNTER — Encounter: Payer: Self-pay | Admitting: Internal Medicine

## 2020-01-20 DIAGNOSIS — K529 Noninfective gastroenteritis and colitis, unspecified: Secondary | ICD-10-CM | POA: Diagnosis not present

## 2020-01-20 LAB — BASIC METABOLIC PANEL
Anion gap: 7 (ref 5–15)
Anion gap: 7 (ref 5–15)
BUN: 9 mg/dL (ref 8–23)
BUN: 12 mg/dL (ref 8–23)
CO2: 22 mmol/L (ref 22–32)
CO2: 22 mmol/L (ref 22–32)
Calcium: 8.8 mg/dL — ABNORMAL LOW (ref 8.9–10.3)
Calcium: 8.7 mg/dL — ABNORMAL LOW (ref 8.9–10.3)
Chloride: 99 mmol/L (ref 98–111)
Chloride: 99 mmol/L (ref 98–111)
Creatinine, Ser: 0.64 mg/dL (ref 0.44–1.00)
Creatinine, Ser: 0.81 mg/dL (ref 0.44–1.00)
GFR calc Af Amer: >60 mL/min (ref >60)
GFR calc Af Amer: >60 mL/min (ref >60)
GFR calc non Af Amer: >60 mL/min (ref >60)
GFR calc non Af Amer: >60 mL/min (ref >60)
Glucose, Bld: 86 mg/dL (ref 70–99)
Glucose, Bld: 92 mg/dL (ref 70–99)
Potassium: 3.8 mmol/L (ref 3.5–5.1)
Potassium: 3.7 mmol/L (ref 3.5–5.1)
Sodium: 128 mmol/L — ABNORMAL LOW (ref 135–145)
Sodium: 128 mmol/L — ABNORMAL LOW (ref 135–145)

## 2020-01-20 LAB — CBC
HCT: 35.8 % — ABNORMAL LOW (ref 36.0–46.0)
Hemoglobin: 12 g/dL (ref 12.0–15.0)
MCH: 28.2 pg (ref 26.0–34.0)
MCHC: 33.5 g/dL (ref 30.0–36.0)
MCV: 84.2 fL (ref 80.0–100.0)
Platelets: 313 10*3/uL (ref 150–400)
RBC: 4.25 MIL/uL (ref 3.87–5.11)
RDW: 14.5 % (ref 11.5–15.5)
WBC: 5.6 10*3/uL (ref 4.0–10.5)
nRBC: 0 % (ref 0.0–0.2)

## 2020-01-20 LAB — SARS CORONAVIRUS 2 (TAT 6-24 HRS): SARS Coronavirus 2: NEGATIVE

## 2020-01-20 NOTE — Discharge Summary (Signed)
[facility] NAME: Pam Cruz    MR#:  [id number]  DATE OF BIRTH:  28-Nov-1934  DATE OF ADMISSION:  01/19/2020 ADMITTING PHYSICIAN: Lenore Cordia, MD  DATE OF DISCHARGE: 01/20/2020  PRIMARY CARE PHYSICIAN: Lavera Guise, MD    ADMISSION DIAGNOSIS:  Dehydration [E86.0] Hypochloremia [E87.8] Hyponatremia [E87.1] Generalized weakness [R53.1]  DISCHARGE DIAGNOSIS:  acute gastroenteritis-- suspected food poisoning-- resolved dehydration from G.I. losses mild hyponatremia secondary to dehydration SECONDARY DIAGNOSIS:   Past Medical History:  Diagnosis Date  . Anxiety   . Arthritis   . COPD (chronic obstructive pulmonary disease) ([place])   . Dyspnea   . Gastritis   . GERD (gastroesophageal reflux disease)   . Hypertension   . Osteoporosis     HOSPITAL COURSE:  Pam Cruz is a 85 y.o. female with medical history significant for COPD, hypertension, hypothyroidism, RBBB, GERD, IBS, OA s/p right hip replacement, and anxiety who presents to the ED for evaluation of nausea, vomiting, and diarrhea. Patient states she was in her usual state of health until last night.  She had a large meal from [place] [employer].  She states that she thinks she ate too much.  Last night she began having nausea with vomiting.  Gastroenteritis: -Likely due to food she ate last night.  Having continued intermittent nausea and vomiting.  -Had large volume loose stool at home but no further episodes while in the ED or hospital -patient received IV fluids. Now tolerating PO breakfast and lunch. -No nausea vomiting or diarrhea -Antiemetics as needed  Hyponatremia: Likely hypovolemic from GI losses.  Continue IV fluid hydration overnight and repeat labs in a.m. -repeat labs showed sodium of 128 -PCP to repeat labs next week  Hypertension: BP elevated on arrival as she was unable to take her home antihypertensives orally.  Restart home losartan  and hydralazine. Patient will keep log of blood pressure at home and review with PCP at her upcoming appointment next week  COPD: Chronic and stable.  Continue albuterol inhaler as needed.  Hypothyroidism: Continue Synthroid.  GERD: Continue PPI.  Anxiety: Resume as needed Klonopin twice daily.  Ambulated around the nurses station. Patient advised to use cane/walker as needed at home. She will discharge to home with outpatient follow-up with PCP next week  Discussed with sister 12 Marveen Reeks  DVT prophylaxis: Lovenox Code Status: Full code, confirmed with patient Family Communication: sister Gae Bon on the phone Disposition Plan: From home--d/c to home Consults called: None  CONSULTS OBTAINED:    DRUG ALLERGIES:   Allergies  Allergen Reactions  . Ambien [Zolpidem Tartrate]     Pt unsure   . Amoxicillin Nausea And Vomiting  . Codeine     Other reaction(s): Abdominal Pain  . Erythromycin Nausea And Vomiting  . Penicillins Nausea And Vomiting  . Reglan [Metoclopramide] Nausea And Vomiting  . Sulfa Antibiotics Nausea And Vomiting    DISCHARGE MEDICATIONS:   Allergies as of 01/20/2020      Reactions   Ambien [zolpidem Tartrate]    Pt unsure    Amoxicillin Nausea And Vomiting   Codeine    Other reaction(s): Abdominal Pain   Erythromycin Nausea And Vomiting   Penicillins Nausea And Vomiting   Reglan [metoclopramide] Nausea And Vomiting   Sulfa Antibiotics Nausea And Vomiting      Medication List    STOP taking these medications   Magnesium 200 MG Tabs     TAKE these medications  acetaminophen 500 MG tablet Commonly known as: TYLENOL Take 500 mg by mouth every 6 (six) hours as needed (pain).   clonazePAM 0.5 MG tablet Commonly known as: KLONOPIN Take 1 tablet (0.5 mg total) by mouth 2 (two) times daily as needed. for anxiety   hydrALAZINE 50 MG tablet Commonly known as: APRESOLINE Take 1 tablet in AM and 1/2 tablet in afternoon and evening.    levothyroxine 50 MCG tablet Commonly known as: SYNTHROID TAKE ONE-HALF TABLET BY  MOUTH ONCE A DAY ON AN  EMPTY STOMACH   losartan 50 MG tablet Commonly known as: COZAAR Take 1 tablet (50 mg total) by mouth 2 (two) times daily.   omeprazole 20 MG capsule Commonly known as: PRILOSEC Take 20 mg by mouth 2 (two) times daily before a meal.   ondansetron 4 MG tablet Commonly known as: ZOFRAN Take 1 tablet (4 mg total) by mouth every 8 (eight) hours as needed for nausea or vomiting.   potassium chloride 10 MEQ tablet Commonly known as: KLOR-CON Take 1 tablet (10 mEq total) by mouth daily.   sennosides-docusate sodium 8.6-50 MG tablet Commonly known as: SENOKOT-S Take 1-2 tablets by mouth at bedtime.   terbinafine 1 % cream Commonly known as: LAMISIL Apply 1 application topically 2 (two) times daily as needed (toes).       If you experience worsening of your admission symptoms, develop shortness of breath, life threatening emergency, suicidal or homicidal thoughts you must seek medical attention immediately by calling 911 or calling your MD immediately  if symptoms less severe.  You Must read complete instructions/literature along with all the possible adverse reactions/side effects for all the Medicines you take and that have been prescribed to you. Take any new Medicines after you have completely understood and accept all the possible adverse reactions/side effects.   Please note  You were cared for by a hospitalist during your hospital stay. If you have any questions about your discharge medications or the care you received while you were in the hospital after you are discharged, you can call the unit and asked to speak with the hospitalist on call if the hospitalist that took care of you is not available. Once you are discharged, your primary care physician will handle any further medical issues. Please note that NO REFILLS for any discharge medications will be authorized once you  are discharged, as it is imperative that you return to your primary care physician (or establish a relationship with a primary care physician if you do not have one) for your aftercare needs so that they can reassess your need for medications and monitor your lab values. Today   SUBJECTIVE   No nausea vomiting diarrhea. Tolerating PO diet. Feels little bit weak.  VITAL SIGNS:  Blood pressure 127/70, pulse 67, temperature 98.6 F (37 C), temperature source Oral, resp. rate 16, height 4\' 7"  (1.397 m), weight 36 kg, SpO2 94 %.  I/O:    Intake/Output Summary (Last 24 hours) at 01/20/2020 1341 Last data filed at 01/20/2020 0047 Gross per 24 hour  Intake 1040 ml  Output --  Net 1040 ml    PHYSICAL EXAMINATION:  GENERAL:  85 y.o.-year-old patient lying in the bed with no acute distress.  EYES: Pupils equal, round, reactive to light and accommodation. No scleral icterus.  HEENT: Head atraumatic, normocephalic. Oropharynx and nasopharynx clear.  NECK:  Supple, no jugular venous distention. No thyroid enlargement, no tenderness.  LUNGS: Normal breath sounds bilaterally, no wheezing, rales,rhonchi or crepitation.  No use of accessory muscles of respiration.  CARDIOVASCULAR: S1, S2 normal. No murmurs, rubs, or gallops.  ABDOMEN: Soft, non-tender, non-distended. Bowel sounds present. No organomegaly or mass.  EXTREMITIES: No pedal edema, cyanosis, or clubbing.  NEUROLOGIC: Cranial nerves II through XII are intact. Muscle strength 5/5 in all extremities. Sensation intact. Gait not checked.  PSYCHIATRIC: The patient is alert and oriented x 3.  SKIN: No obvious rash, lesion, or ulcer.   DATA REVIEW:   CBC  Recent Labs  Lab 01/20/20 0515  WBC 5.6  HGB 12.0  HCT 35.8*  PLT 313    Chemistries  Recent Labs  Lab 01/19/20 1545 01/20/20 0515 01/20/20 1121  NA 125*   < > 128*  K 4.1   < > 3.7  CL 89*   < > 99  CO2 24   < > 22  GLUCOSE 135*   < > 92  BUN 14   < > 12  CREATININE 0.56   <  > 0.81  CALCIUM 9.7   < > 8.7*  MG 2.0  --   --   AST 33  --   --   ALT 20  --   --   ALKPHOS 93  --   --   BILITOT 1.0  --   --    < > = values in this interval not displayed.    Microbiology Results   Recent Results (from the past 240 hour(s))  SARS CORONAVIRUS 2 (TAT 6-24 HRS) Nasopharyngeal Nasopharyngeal Swab     Status: None   Collection Time: 01/19/20  8:03 PM   Specimen: Nasopharyngeal Swab  Result Value Ref Range Status   SARS Coronavirus 2 NEGATIVE NEGATIVE Final    Comment: (NOTE) SARS-CoV-2 target nucleic acids are NOT DETECTED. The SARS-CoV-2 RNA is generally detectable in upper and lower respiratory specimens during the acute phase of infection. Negative results do not preclude SARS-CoV-2 infection, do not rule out co-infections with other pathogens, and should not be used as the sole basis for treatment or other patient management decisions. Negative results must be combined with clinical observations, patient history, and epidemiological information. The expected result is Negative. Fact Sheet for Patients: SugarRoll.be Fact Sheet for Healthcare Providers: https://www.woods-mathews.com/ This test is not yet approved or cleared by the [place] FDA and  has been authorized for detection and/or diagnosis of SARS-CoV-2 by FDA under an Emergency Use Authorization (EUA). This EUA will remain  in effect (meaning this test can be used) for the duration of the COVID-19 declaration under Section 56 4(b)(1) of the Act, 21 U.S.C. section 360bbb-3(b)(1), unless the authorization is terminated or revoked sooner. Performed at [facility], [place] [address], [place], [place] [address]     RADIOLOGY:  No results found.   CODE STATUS:     Code Status Orders  (From admission, onward)         Start     Ordered   01/19/20 2111  Full code  Continuous     01/19/20 2111        Code Status History    Date Active  Date Inactive Code Status Order ID Comments User Context   08/17/2019 1200 08/19/2019 1802 Full Code [id number]  Dereck Leep, MD Inpatient   Advance Care Planning Activity    Advance Directive Documentation     Most Recent Value  Type of Advance Directive  Healthcare Power of Attorney, Living will  Pre-existing out of facility DNR order (yellow  form or pink MOST form)  --  "MOST" Form in Place?  --       TOTAL TIME TAKING CARE OF THIS PATIENT: *40* minutes.    Fritzi Mandes M.D  [facility]    CC: Primary care physician; Lavera Guise, MD

## 2020-01-20 NOTE — Plan of Care (Signed)
  Problem: Education: Goal: Knowledge of General Education information will improve Description: Including pain rating scale, medication(s)/side effects and non-pharmacologic comfort measures Outcome: Completed/Met

## 2020-01-20 NOTE — Progress Notes (Signed)
D: Pt alert and oriented x 4. Pt denies experiencing any pain at this time.   A: Pt and sister received discharge and medication education/information. Pt belongings were gathered and taken with pt upon discharge.   R: Pt and sister verbalized understanding of discharge and medication education/information.  Pt escorted to medical mall front lobby by staff via wheelchair where sister picked pt up.

## 2020-01-20 NOTE — Discharge Instructions (Signed)
Patient to keep log of her blood pressure at home for primary care physician to review

## 2020-01-21 ENCOUNTER — Ambulatory Visit: Payer: Medicare Other | Admitting: Adult Health

## 2020-01-22 ENCOUNTER — Telehealth: Payer: Self-pay

## 2020-01-22 ENCOUNTER — Other Ambulatory Visit: Payer: Self-pay

## 2020-01-22 DIAGNOSIS — I10 Essential (primary) hypertension: Secondary | ICD-10-CM

## 2020-01-22 MED ORDER — HYDRALAZINE HCL 50 MG PO TABS: ORAL_TABLET | ORAL | 0 refills | Status: DC

## 2020-01-22 MED ORDER — HYDRALAZINE HCL 50 MG PO TABS: ORAL_TABLET | ORAL | 1 refills | Status: DC

## 2020-01-22 NOTE — Telephone Encounter (Signed)
Confirmed and screened for 01-26-20 ov time appt was scheduled.

## 2020-01-25 ENCOUNTER — Telehealth: Payer: Self-pay

## 2020-01-25 NOTE — Telephone Encounter (Signed)
Pt called stating that hydralizine caused severe heartburn and pt was advised to stop hydralizine due to the side effects. Pt has an appt 01/26/20 and medications can be managed further during the time of her visit.

## 2020-01-26 ENCOUNTER — Other Ambulatory Visit: Payer: Self-pay

## 2020-01-26 ENCOUNTER — Encounter: Payer: Self-pay | Admitting: Internal Medicine

## 2020-01-26 ENCOUNTER — Ambulatory Visit (INDEPENDENT_AMBULATORY_CARE_PROVIDER_SITE_OTHER): Payer: Medicare Other | Admitting: Internal Medicine

## 2020-01-26 DIAGNOSIS — R1084 Generalized abdominal pain: Secondary | ICD-10-CM

## 2020-01-26 DIAGNOSIS — F411 Generalized anxiety disorder: Secondary | ICD-10-CM | POA: Diagnosis not present

## 2020-01-26 DIAGNOSIS — R519 Headache, unspecified: Secondary | ICD-10-CM

## 2020-01-26 DIAGNOSIS — E871 Hypo-osmolality and hyponatremia: Secondary | ICD-10-CM

## 2020-01-26 DIAGNOSIS — I10 Essential (primary) hypertension: Secondary | ICD-10-CM

## 2020-01-26 DIAGNOSIS — H35051 Retinal neovascularization, unspecified, right eye: Secondary | ICD-10-CM | POA: Diagnosis not present

## 2020-01-26 DIAGNOSIS — G8929 Other chronic pain: Secondary | ICD-10-CM

## 2020-01-26 MED ORDER — AMLODIPINE BESYLATE 2.5 MG PO TABS: 2.5000 mg | ORAL_TABLET | Freq: Every day | ORAL | 3 refills | Status: DC

## 2020-01-26 MED ORDER — HYDRALAZINE HCL 25 MG PO TABS: 25.0000 mg | ORAL_TABLET | Freq: Three times a day (TID) | ORAL | 3 refills | Status: DC

## 2020-01-26 NOTE — Progress Notes (Signed)
[facility] [place],  [id number]  Internal MEDICINE  Office Visit Note  Patient Name: Pam Cruz  [id number]  [id number]  Date of Service: 01/28/2020  ((Transitional care management))   Chief Complaint  Patient presents with  . Hospitalization Follow-up  . Hypertension     HPI Pt is here for recent hospital follow up. Pt was hospitalized for nausea , vomiting and low sodium. She was treated and discharged, her recent sodium is still low. She continues to have headaches and thinks it is because of her bp meds. She is also having nausea, previous h/o IBS/ diverticulitis. She also thinks that injections she gets in her ae causing her some problems as well. ( no medical records to review). Let eye vision is blurred, she cannot drive. Pt c/o cramps at night  She is in the room with her sister    Current Medication: Outpatient Encounter Medications as of 01/26/2020  Medication Sig  . acetaminophen (TYLENOL) 500 MG tablet Take 500 mg by mouth every 6 (six) hours as needed (pain).   . clonazePAM (KLONOPIN) 0.5 MG tablet Take 1 tablet (0.5 mg total) by mouth 2 (two) times daily as needed. for anxiety  . desipramine (NORPRAMIN) 25 MG tablet Take 25 mg by mouth daily. By dr Tiffany Kocher  . fluticasone (FLONASE) 50 MCG/ACT nasal spray Place into both nostrils daily.  . Fluticasone-Salmeterol (WIXELA INHUB) 250-50 MCG/DOSE AEPB Inhale 1 puff into the lungs 2 (two) times daily.  Marland Kitchen levothyroxine (SYNTHROID) 50 MCG tablet TAKE ONE-HALF TABLET BY  MOUTH ONCE A DAY ON AN  EMPTY STOMACH  . losartan (COZAAR) 50 MG tablet Take 1 tablet (50 mg total) by mouth 2 (two) times daily.  Marland Kitchen omeprazole (PRILOSEC) 20 MG capsule Take 20 mg by mouth 2 (two) times daily before a meal.   . ondansetron (ZOFRAN) 4 MG tablet Take 1 tablet (4 mg total) by mouth every 8 (eight) hours as needed for nausea or vomiting.  . sennosides-docusate sodium (SENOKOT-S) 8.6-50 MG tablet Take 1-2 tablets  by mouth at bedtime.   . terbinafine (LAMISIL) 1 % cream Apply 1 application topically 2 (two) times daily as needed (toes).   . [DISCONTINUED] hydrALAZINE (APRESOLINE) 50 MG tablet Take 1 tablet in AM and 1/2 tablet in afternoon and evening.  Marland Kitchen amLODipine (NORVASC) 2.5 MG tablet Take 1 tablet (2.5 mg total) by mouth daily.  . hydrALAZINE (APRESOLINE) 25 MG tablet Take 1 tablet (25 mg total) by mouth 3 (three) times daily.  . potassium chloride (KLOR-CON) 10 MEQ tablet Take 1 tablet (10 mEq total) by mouth daily. (Patient not taking: Reported on 01/26/2020)   No facility-administered encounter medications on file as of 01/26/2020.    Surgical History: Past Surgical History:  Procedure Laterality Date  . COLONOSCOPY    . cysto    . LAPAROSCOPY    . TOTAL HIP ARTHROPLASTY Right 08/17/2019   Procedure: TOTAL HIP ARTHROPLASTY;  Surgeon: Dereck Leep, MD;  Location: [facility] ORS;  Service: Orthopedics;  Laterality: Right;    Medical History: Past Medical History:  Diagnosis Date  . Anxiety   . Arthritis   . COPD (chronic obstructive pulmonary disease) ([place])   . Dyspnea   . Gastritis   . GERD (gastroesophageal reflux disease)   . Hypertension   . Osteoporosis     Family History: Family History  Problem Relation Age of Onset  . Hypertension Mother   . Coronary artery disease Father   .  Heart disease Father     Social History   Socioeconomic History  . Marital status: Single    Spouse name: Not on file  . Number of children: Not on file  . Years of education: Not on file  . Highest education level: Not on file  Occupational History  . Not on file  Tobacco Use  . Smoking status: Former Smoker    Quit date: 02/20/2001    Years since quitting: 18.9  . Smokeless tobacco: Never Used  . Tobacco comment: quit 17 years ago  Substance and Sexual Activity  . Alcohol use: Yes    Alcohol/week: 2.0 standard drinks    Types: 2 Glasses of wine per week    Comment: very rarely  . Drug  use: Never  . Sexual activity: Not on file  Other Topics Concern  . Not on file  Social History Narrative  . Not on file   Social Determinants of Health   Financial Resource Strain:   . Difficulty of Paying Living Expenses:   Food Insecurity:   . Worried About Charity fundraiser in the Last Year:   . Arboriculturist in the Last Year:   Transportation Needs:   . Film/video editor (Medical):   Marland Kitchen Lack of Transportation (Non-Medical):   Physical Activity:   . Days of Exercise per Week:   . Minutes of Exercise per Session:   Stress:   . Feeling of Stress :   Social Connections:   . Frequency of Communication with Friends and Family:   . Frequency of Social Gatherings with Friends and Family:   . Attends Religious Services:   . Active Member of Clubs or Organizations:   . Attends Archivist Meetings:   Marland Kitchen Marital Status:   Intimate Partner Violence:   . Fear of Current or Ex-Partner:   . Emotionally Abused:   Marland Kitchen Physically Abused:   . Sexually Abused:    Review of Systems  Constitutional: Negative for chills, diaphoresis and fatigue.  HENT: Negative for ear pain, postnasal drip and sinus pressure.   Eyes: Negative for photophobia, discharge, redness, itching and visual disturbance.  Respiratory: Negative for cough, shortness of breath and wheezing.   Cardiovascular: Negative for chest pain, palpitations and leg swelling.  Gastrointestinal: Positive for abdominal pain and nausea. Negative for constipation, diarrhea and vomiting.  Genitourinary: Negative for dysuria and flank pain.  Musculoskeletal: Negative for arthralgias, back pain, gait problem and neck pain.  Skin: Negative for color change.  Allergic/Immunologic: Negative for environmental allergies and food allergies.  Neurological: Negative for dizziness and headaches.  Hematological: Does not bruise/bleed easily.  Psychiatric/Behavioral: Negative for agitation, behavioral problems (depression) and  hallucinations.   Vital Signs: BP (!) 150/94   Pulse (!) 59   Temp (!) 97.4 F (36.3 C)   Resp 16   Ht 4\' 7"  (1.397 m)   Wt 80 lb (36.3 kg)   SpO2 99%   BMI 18.59 kg/m    Physical Exam Constitutional:      Appearance: Normal appearance.  Cardiovascular:     Rate and Rhythm: Normal rate and regular rhythm.     Pulses: Normal pulses.     Heart sounds: Normal heart sounds.  Pulmonary:     Effort: Pulmonary effort is normal.     Breath sounds: Normal breath sounds.  Abdominal:     General: Abdomen is flat.     Palpations: Abdomen is soft.  Neurological:  Mental Status: She is alert.  Psychiatric:     Comments: anxious    Assessment/Plan: 1. Hyponatremia Repeat Labs, if persists, will need further work up. Might need CT scan of chest  - Basic metabolic panel - Magnesium  2. Essential hypertension - Pt is to continue Losartan 50 mg bid, Norvasc 2.5 mg qd per her request. Hydralazine 25 mg tid   3. Generalized anxiety disorder - Continue meds as before   4. Chronic intractable headache, unspecified headache type - Can be related to her eye treatment  5. Generalized abdominal cramps - Can be due to IBS or ?? Mesenteric ischemia, will need more testing   General Counseling: flor biernat understanding of the findings of todays visit and agrees with plan of treatment. I have discussed any further diagnostic evaluation that may be needed or ordered today. We also reviewed her medications today. she has been encouraged to call the office with any questions or concerns that should arise related to todays visit.   Orders Placed This Encounter  Procedures  . Basic metabolic panel  . Magnesium  . B12 and Folate Panel  . Fe+TIBC+Fer   I have reviewed all medical records from hospital follow up including radiology reports and consults from other physicians. Appropriate follow up diagnostics will be scheduled as needed. Patient/ Family understands the plan of  treatment. Time spent 35 minutes.   Dr Lavera Guise, MD Internal Medicine

## 2020-01-27 ENCOUNTER — Telehealth: Payer: Self-pay

## 2020-01-27 NOTE — Telephone Encounter (Signed)
Pt called that that she allergies and headache as per khan advised  That she used flonase  For now and not take singulair due to side effect

## 2020-01-28 DIAGNOSIS — D510 Vitamin B12 deficiency anemia due to intrinsic factor deficiency: Secondary | ICD-10-CM | POA: Diagnosis not present

## 2020-01-28 DIAGNOSIS — I10 Essential (primary) hypertension: Secondary | ICD-10-CM | POA: Diagnosis not present

## 2020-01-28 DIAGNOSIS — D509 Iron deficiency anemia, unspecified: Secondary | ICD-10-CM | POA: Diagnosis not present

## 2020-01-28 DIAGNOSIS — E871 Hypo-osmolality and hyponatremia: Secondary | ICD-10-CM | POA: Diagnosis not present

## 2020-01-29 ENCOUNTER — Inpatient Hospital Stay: Payer: Medicare Other

## 2020-01-29 ENCOUNTER — Other Ambulatory Visit: Payer: Self-pay

## 2020-01-29 ENCOUNTER — Observation Stay
Admission: AD | Admit: 2020-01-29 | Discharge: 2020-01-30 | Disposition: A | Discharge status: Home / Self Care | Payer: Medicare Other | Source: Ambulatory Visit | Attending: Internal Medicine | Admitting: Internal Medicine

## 2020-01-29 ENCOUNTER — Encounter: Payer: Self-pay | Admitting: Internal Medicine

## 2020-01-29 DIAGNOSIS — F419 Anxiety disorder, unspecified: Secondary | ICD-10-CM | POA: Insufficient documentation

## 2020-01-29 DIAGNOSIS — Z7951 Long term (current) use of inhaled steroids: Secondary | ICD-10-CM | POA: Diagnosis not present

## 2020-01-29 DIAGNOSIS — D709 Neutropenia, unspecified: Secondary | ICD-10-CM | POA: Diagnosis present

## 2020-01-29 DIAGNOSIS — Z880 Allergy status to penicillin: Secondary | ICD-10-CM | POA: Insufficient documentation

## 2020-01-29 DIAGNOSIS — E871 Hypo-osmolality and hyponatremia: Principal | ICD-10-CM | POA: Insufficient documentation

## 2020-01-29 DIAGNOSIS — Z888 Allergy status to other drugs, medicaments and biological substances status: Secondary | ICD-10-CM | POA: Insufficient documentation

## 2020-01-29 DIAGNOSIS — J449 Chronic obstructive pulmonary disease, unspecified: Secondary | ICD-10-CM | POA: Diagnosis not present

## 2020-01-29 DIAGNOSIS — Z885 Allergy status to narcotic agent status: Secondary | ICD-10-CM | POA: Insufficient documentation

## 2020-01-29 DIAGNOSIS — Z20822 Contact with and (suspected) exposure to covid-19: Secondary | ICD-10-CM | POA: Insufficient documentation

## 2020-01-29 DIAGNOSIS — E039 Hypothyroidism, unspecified: Secondary | ICD-10-CM | POA: Insufficient documentation

## 2020-01-29 DIAGNOSIS — M810 Age-related osteoporosis without current pathological fracture: Secondary | ICD-10-CM | POA: Diagnosis not present

## 2020-01-29 DIAGNOSIS — K589 Irritable bowel syndrome without diarrhea: Secondary | ICD-10-CM | POA: Insufficient documentation

## 2020-01-29 DIAGNOSIS — M1990 Unspecified osteoarthritis, unspecified site: Secondary | ICD-10-CM | POA: Insufficient documentation

## 2020-01-29 DIAGNOSIS — Z96641 Presence of right artificial hip joint: Secondary | ICD-10-CM | POA: Insufficient documentation

## 2020-01-29 DIAGNOSIS — I10 Essential (primary) hypertension: Secondary | ICD-10-CM | POA: Insufficient documentation

## 2020-01-29 DIAGNOSIS — Z8719 Personal history of other diseases of the digestive system: Secondary | ICD-10-CM

## 2020-01-29 DIAGNOSIS — Z8249 Family history of ischemic heart disease and other diseases of the circulatory system: Secondary | ICD-10-CM | POA: Insufficient documentation

## 2020-01-29 DIAGNOSIS — Z87891 Personal history of nicotine dependence: Secondary | ICD-10-CM | POA: Diagnosis not present

## 2020-01-29 DIAGNOSIS — K219 Gastro-esophageal reflux disease without esophagitis: Secondary | ICD-10-CM | POA: Diagnosis not present

## 2020-01-29 DIAGNOSIS — Z79890 Hormone replacement therapy (postmenopausal): Secondary | ICD-10-CM | POA: Insufficient documentation

## 2020-01-29 DIAGNOSIS — Z79899 Other long term (current) drug therapy: Secondary | ICD-10-CM | POA: Insufficient documentation

## 2020-01-29 DIAGNOSIS — R5081 Fever presenting with conditions classified elsewhere: Secondary | ICD-10-CM | POA: Diagnosis present

## 2020-01-29 DIAGNOSIS — Z881 Allergy status to other antibiotic agents status: Secondary | ICD-10-CM | POA: Diagnosis not present

## 2020-01-29 DIAGNOSIS — R509 Fever, unspecified: Secondary | ICD-10-CM | POA: Diagnosis not present

## 2020-01-29 DIAGNOSIS — Z882 Allergy status to sulfonamides status: Secondary | ICD-10-CM | POA: Diagnosis not present

## 2020-01-29 LAB — URINALYSIS, COMPLETE (UACMP) WITH MICROSCOPIC
Bilirubin Urine: NEGATIVE
Glucose, UA: NEGATIVE mg/dL
Hgb urine dipstick: NEGATIVE
Ketones, ur: NEGATIVE mg/dL
Leukocytes,Ua: NEGATIVE
Nitrite: NEGATIVE
Protein, ur: 30 mg/dL — AB
Specific Gravity, Urine: 1.008 (ref 1.005–1.030)
pH: 5 (ref 5.0–8.0)

## 2020-01-29 LAB — COMPREHENSIVE METABOLIC PANEL
ALT: 21 U/L (ref 0–44)
AST: 29 U/L (ref 15–41)
Albumin: 4.8 g/dL (ref 3.5–5.0)
Alkaline Phosphatase: 80 U/L (ref 38–126)
Anion gap: 12 (ref 5–15)
BUN: 23 mg/dL (ref 8–23)
CO2: 22 mmol/L (ref 22–32)
Calcium: 10.1 mg/dL (ref 8.9–10.3)
Chloride: 94 mmol/L — ABNORMAL LOW (ref 98–111)
Creatinine, Ser: 1.07 mg/dL — ABNORMAL HIGH (ref 0.44–1.00)
GFR calc Af Amer: 56 mL/min — ABNORMAL LOW (ref >60)
GFR calc non Af Amer: 48 mL/min — ABNORMAL LOW (ref >60)
Glucose, Bld: 89 mg/dL (ref 70–99)
Potassium: 4.2 mmol/L (ref 3.5–5.1)
Sodium: 128 mmol/L — ABNORMAL LOW (ref 135–145)
Total Bilirubin: 0.8 mg/dL (ref 0.3–1.2)
Total Protein: 7 g/dL (ref 6.5–8.1)

## 2020-01-29 LAB — CBC
HCT: 37 % (ref 36.0–46.0)
Hemoglobin: 12.5 g/dL (ref 12.0–15.0)
MCH: 28.7 pg (ref 26.0–34.0)
MCHC: 33.8 g/dL (ref 30.0–36.0)
MCV: 85.1 fL (ref 80.0–100.0)
Platelets: 349 10*3/uL (ref 150–400)
RBC: 4.35 MIL/uL (ref 3.87–5.11)
RDW: 14.9 % (ref 11.5–15.5)
WBC: 7.7 10*3/uL (ref 4.0–10.5)
nRBC: 0 % (ref 0.0–0.2)

## 2020-01-29 LAB — BASIC METABOLIC PANEL
BUN/Creatinine Ratio: 21 (ref 12–28)
BUN: 17 mg/dL (ref 8–27)
CO2: 23 mmol/L (ref 20–29)
Calcium: 10.2 mg/dL (ref 8.7–10.3)
Chloride: 83 mmol/L — ABNORMAL LOW (ref 96–106)
Creatinine, Ser: 0.8 mg/dL (ref 0.57–1.00)
GFR calc Af Amer: 79 mL/min/{1.73_m2} (ref >59)
GFR calc non Af Amer: 69 mL/min/{1.73_m2} (ref >59)
Glucose: 105 mg/dL — ABNORMAL HIGH (ref 65–99)
Potassium: 4.4 mmol/L (ref 3.5–5.2)
Sodium: 123 mmol/L — ABNORMAL LOW (ref 134–144)

## 2020-01-29 LAB — IRON,TIBC AND FERRITIN PANEL
Ferritin: 46 ng/mL (ref 15–150)
Iron Saturation: 37 % (ref 15–55)
Iron: 146 ug/dL — ABNORMAL HIGH (ref 27–139)
Total Iron Binding Capacity: 399 ug/dL (ref 250–450)
UIBC: 253 ug/dL (ref 118–369)

## 2020-01-29 LAB — MAGNESIUM: Magnesium: 2 mg/dL (ref 1.6–2.3)

## 2020-01-29 LAB — B12 AND FOLATE PANEL
Folate: 15.9 ng/mL (ref >3.0)
Vitamin B-12: 402 pg/mL (ref 232–1245)

## 2020-01-29 LAB — OSMOLALITY, URINE: Osmolality, Ur: 252 mOsm/kg — ABNORMAL LOW (ref 300–900)

## 2020-01-29 LAB — OSMOLALITY: Osmolality: 278 mOsm/kg (ref 275–295)

## 2020-01-29 LAB — SODIUM, URINE, RANDOM: Sodium, Ur: <10 mmol/L

## 2020-01-29 LAB — RESPIRATORY PANEL BY RT PCR (FLU A&B, COVID)
Influenza A by PCR: NEGATIVE
Influenza B by PCR: NEGATIVE
SARS Coronavirus 2 by RT PCR: NEGATIVE

## 2020-01-29 MED ORDER — LEVOTHYROXINE SODIUM 50 MCG PO TABS
25.0000 ug | ORAL_TABLET | Freq: Every day | ORAL | Status: DC
  Administered 2020-01-30: 25 ug via ORAL
  Filled 2020-01-29: qty 1

## 2020-01-29 MED ORDER — ACETAMINOPHEN 650 MG RE SUPP: 650.0000 mg | Freq: Four times a day (QID) | RECTAL | Status: DC | PRN

## 2020-01-29 MED ORDER — OCUVITE-LUTEIN PO CAPS
1.0000 | ORAL_CAPSULE | Freq: Every day | ORAL | Status: DC
  Administered 2020-01-30: 11:00:00 1 via ORAL
  Filled 2020-01-29: qty 1

## 2020-01-29 MED ORDER — FLUTICASONE FUROATE-VILANTEROL 200-25 MCG/INH IN AEPB
1.0000 | INHALATION_SPRAY | Freq: Every day | RESPIRATORY_TRACT | Status: DC
  Administered 2020-01-30: 11:00:00 1 via RESPIRATORY_TRACT
  Filled 2020-01-29: qty 28

## 2020-01-29 MED ORDER — CLONAZEPAM 0.5 MG PO TABS
0.5000 mg | ORAL_TABLET | Freq: Two times a day (BID) | ORAL | Status: DC | PRN
  Administered 2020-01-29: 22:00:00 0.5 mg via ORAL
  Filled 2020-01-29: qty 1

## 2020-01-29 MED ORDER — HYDRALAZINE HCL 25 MG PO TABS
25.0000 mg | ORAL_TABLET | Freq: Three times a day (TID) | ORAL | Status: DC
  Administered 2020-01-29 – 2020-01-30 (×2): 25 mg via ORAL
  Filled 2020-01-29 (×2): qty 1

## 2020-01-29 MED ORDER — LOSARTAN POTASSIUM 50 MG PO TABS
50.0000 mg | ORAL_TABLET | Freq: Two times a day (BID) | ORAL | Status: DC
  Administered 2020-01-29 – 2020-01-30 (×2): 50 mg via ORAL
  Filled 2020-01-29 (×2): qty 1

## 2020-01-29 MED ORDER — SENNOSIDES-DOCUSATE SODIUM 8.6-50 MG PO TABS
1.0000 | ORAL_TABLET | Freq: Every day | ORAL | Status: DC
  Administered 2020-01-29: 20:00:00 1 via ORAL
  Filled 2020-01-29: qty 1

## 2020-01-29 MED ORDER — PANTOPRAZOLE SODIUM 40 MG PO TBEC
40.0000 mg | DELAYED_RELEASE_TABLET | Freq: Every day | ORAL | Status: DC
  Administered 2020-01-30: 11:00:00 40 mg via ORAL
  Filled 2020-01-29: qty 1

## 2020-01-29 MED ORDER — ENOXAPARIN SODIUM 30 MG/0.3ML ~~LOC~~ SOLN
30.0000 mg | SUBCUTANEOUS | Status: DC
  Administered 2020-01-29: 20:00:00 30 mg via SUBCUTANEOUS
  Filled 2020-01-29: qty 0.3

## 2020-01-29 MED ORDER — AMLODIPINE BESYLATE 5 MG PO TABS
2.5000 mg | ORAL_TABLET | Freq: Every day | ORAL | Status: DC
  Administered 2020-01-29: 19:00:00 2.5 mg via ORAL
  Filled 2020-01-29 (×2): qty 1

## 2020-01-29 MED ORDER — SODIUM CHLORIDE 0.9 % IV SOLN: INTRAVENOUS | Status: DC

## 2020-01-29 MED ORDER — ACETAMINOPHEN 325 MG PO TABS: 650.0000 mg | ORAL_TABLET | Freq: Four times a day (QID) | ORAL | Status: DC | PRN

## 2020-01-29 NOTE — H&P (Signed)
History and Physical    Pam Cruz  [id number]  DOB: May 25, 1935  DOA: 01/29/2020 PCP: Lavera Guise, MD   Patient coming from: home  Chief Complaint: sent for low sodium  HPI: Pam Cruz is a 85 y.o. female with medical history of COPD, GERD, HTN, Anxiety who is sent from her PCP's office for a low sodium. On arrival, her Bmet shows a sodium of 128. The patient was admitted last week for gastroenteritis and hyponatremia and sodium improved to 128 on the day she was discharged. She states she drinks 32 oz of water as told by her PCP and not much else. She does not eat much but that is not out of the normal. Her weight today is the same as it was when discharged last week, about 75lbs. She did have nausea and some abdominal cramping even after being discharged but this resolved. The nausea resolved as of yesterday and the cramping a few days before. She feels weak and has no other symptoms. She did have a headache related to an eye injection for macular degeneration but this has resolved as well.   ED Course: direct admit.  Review of Systems:  All other systems reviewed and apart from HPI, are negative.  Past Medical History:  Diagnosis Date  . Anxiety   . Arthritis   . COPD (chronic obstructive pulmonary disease) ([place])   . Dyspnea   . Gastritis   . GERD (gastroesophageal reflux disease)   . Hypertension   . Osteoporosis     Past Surgical History:  Procedure Laterality Date  . COLONOSCOPY    . cysto    . LAPAROSCOPY    . TOTAL HIP ARTHROPLASTY Right 08/17/2019   Procedure: TOTAL HIP ARTHROPLASTY;  Surgeon: Dereck Leep, MD;  Location: [facility] ORS;  Service: Orthopedics;  Laterality: Right;    Social History:   reports that she quit smoking about 18 years ago. She has never used smokeless tobacco. She reports current alcohol use of about 2.0 standard drinks of alcohol per week. She reports that she does not use drugs.  Allergies  Allergen Reactions  . Ambien  [Zolpidem Tartrate]     Pt unsure   . Amoxicillin Nausea And Vomiting  . Codeine     Other reaction(s): Abdominal Pain  . Erythromycin Nausea And Vomiting  . Penicillins Nausea And Vomiting  . Reglan [Metoclopramide] Nausea And Vomiting  . Sulfa Antibiotics Nausea And Vomiting    Family History  Problem Relation Age of Onset  . Hypertension Mother   . Coronary artery disease Father   . Heart disease Father      Prior to Admission medications   Medication Sig Start Date End Date Taking? Authorizing Provider  acetaminophen (TYLENOL) 500 MG tablet Take 500 mg by mouth every 6 (six) hours as needed (pain).    Yes [provider]  amLODipine (NORVASC) 2.5 MG tablet Take 1 tablet (2.5 mg total) by mouth daily. 01/26/20  Yes Lavera Guise, MD  clonazePAM (KLONOPIN) 0.5 MG tablet Take 1 tablet (0.5 mg total) by mouth 2 (two) times daily as needed. for anxiety 12/30/19  Yes Scarboro, Audie Clear, NP  desipramine (NORPRAMIN) 25 MG tablet Take 25 mg by mouth at bedtime.    Yes [provider]  fluticasone (FLONASE) 50 MCG/ACT nasal spray Place 1-2 sprays into both nostrils daily.    Yes [provider]  Fluticasone-Salmeterol (WIXELA INHUB) 250-50 MCG/DOSE AEPB Inhale 1 puff into the lungs 2 (  two) times daily.   Yes [provider]  Homeopathic Products ([place]) FOAM Apply 2 application topically See admin instructions. Apply two applications to various areas of the body at bedtime according to bottle instructions   Yes [provider]  hydrALAZINE (APRESOLINE) 25 MG tablet Take 1 tablet (25 mg total) by mouth 3 (three) times daily. Patient taking differently: Take 12.5 mg by mouth 3 (three) times daily.  01/26/20  Yes Lavera Guise, MD  levothyroxine (SYNTHROID) 50 MCG tablet TAKE ONE-HALF TABLET BY  MOUTH ONCE A DAY ON AN  EMPTY STOMACH Patient taking differently: Take 25 mcg by mouth daily.  10/14/19  Yes Lavera Guise, MD  losartan (COZAAR) 50  MG tablet Take 1 tablet (50 mg total) by mouth 2 (two) times daily. 12/29/19  Yes Scarboro, Audie Clear, NP  Multiple Vitamins-Minerals (PRESERVISION AREDS 2+MULTI VIT) CAPS Take 1 capsule by mouth in the morning and at bedtime.   Yes [provider]  omeprazole (PRILOSEC) 20 MG capsule Take 20 mg by mouth 2 (two) times daily before a meal.    Yes [provider]  ondansetron (ZOFRAN) 4 MG tablet Take 1 tablet (4 mg total) by mouth every 8 (eight) hours as needed for nausea or vomiting. 01/06/20  Yes Boscia, Heather E, NP  Polyethylene Glycol 400 (BLINK TEARS) 0.25 % GEL Place 1-2 drops into both eyes as needed (dry eyes).   Yes [provider]  sennosides-docusate sodium (SENOKOT-S) 8.6-50 MG tablet Take 1-2 tablets by mouth at bedtime.    Yes [provider]  terbinafine (LAMISIL) 1 % cream Apply 1 application topically 2 (two) times daily as needed (toes).    Yes [provider]    Physical Exam: Wt Readings from Last 3 Encounters:  01/29/20 34.2 kg  01/26/20 36.3 kg  01/19/20 36 kg   Vitals:   01/29/20 1444 01/29/20 1800  BP: (!) 147/81   Pulse: 60   Resp: 16   Temp: (!) 97.4 F (36.3 C)   TempSrc: Oral   SpO2: 97%   Weight: 36.3 kg 34.2 kg  Height: 4\' 7"  (1.397 m) 4\' 7"  (1.397 m)      Constitutional:  Calm & comfortable Eyes: PERRLA, lids and conjunctivae normal ENT:  Mucous membranes are dry Pharynx clear of exudate   Normal dentition.  Neck: Supple, no masses  Respiratory:  Clear to auscultation bilaterally  Normal respiratory effort.  Cardiovascular:  S1 & S2 heard, regular rate and rhythm No Murmurs Abdomen:  Non distended No tenderness, No masses Bowel sounds normal Extremities:  No clubbing / cyanosis No pedal edema No joint deformity    Skin:  No rashes, lesions or ulcers Neurologic:  AAO x 3 CN 2-12 grossly intact Sensation intact Strength 5/5 in all 4 extremities Psychiatric:  Normal Mood and  affect    Labs on Admission: I have personally reviewed following labs and imaging studies  CBC: Recent Labs  Lab 01/29/20 1558  WBC 7.7  HGB 12.5  HCT 37.0  MCV 85.1  PLT 0000000   Basic Metabolic Panel: Recent Labs  Lab 01/28/20 1500 01/29/20 1558  NA 123* 128*  K 4.4 4.2  CL 83* 94*  CO2 23 22  GLUCOSE 105* 89  BUN 17 23  CREATININE 0.80 1.07*  CALCIUM 10.2 10.1  MG 2.0  --    GFR: Estimated Creatinine Clearance: 21.8 mL/min (A) (by C-G formula based on SCr of 1.07 mg/dL (H)). Liver Function Tests: Recent  Labs  Lab 01/29/20 1558  AST 29  ALT 21  ALKPHOS 80  BILITOT 0.8  PROT 7.0  ALBUMIN 4.8   No results for input(s): LIPASE, AMYLASE in the last 168 hours. No results for input(s): AMMONIA in the last 168 hours. Coagulation Profile: No results for input(s): INR, PROTIME in the last 168 hours. Cardiac Enzymes: No results for input(s): CKTOTAL, CKMB, CKMBINDEX, TROPONINI in the last 168 hours. BNP (last 3 results) No results for input(s): PROBNP in the last 8760 hours. HbA1C: No results for input(s): HGBA1C in the last 72 hours. CBG: No results for input(s): GLUCAP in the last 168 hours. Lipid Profile: No results for input(s): CHOL, HDL, LDLCALC, TRIG, CHOLHDL, LDLDIRECT in the last 72 hours. Thyroid Function Tests: No results for input(s): TSH, T4TOTAL, FREET4, T3FREE, THYROIDAB in the last 72 hours. Anemia Panel: Recent Labs    01/28/20 1500  VITAMINB12 402  FOLATE 15.9  FERRITIN 46  TIBC 399  IRON 146*   Urine analysis:    Component Value Date/Time   COLORURINE YELLOW (A) 01/29/2020 1753   APPEARANCEUR HAZY (A) 01/29/2020 1753   APPEARANCEUR Clear 03/10/2019 1132   LABSPEC 1.008 01/29/2020 1753   LABSPEC 1.010 05/03/2014 2105   PHURINE 5.0 01/29/2020 1753   GLUCOSEU NEGATIVE 01/29/2020 1753   GLUCOSEU Negative 05/03/2014 2105   HGBUR NEGATIVE 01/29/2020 1753   [place] 01/29/2020 1753   BILIRUBINUR Negative 03/10/2019 1132    BILIRUBINUR Negative 05/03/2014 2105   KETONESUR NEGATIVE 01/29/2020 1753   PROTEINUR 30 (A) 01/29/2020 1753   NITRITE NEGATIVE 01/29/2020 1753   LEUKOCYTESUR NEGATIVE 01/29/2020 1753   LEUKOCYTESUR Negative 05/03/2014 2105   Sepsis Labs: @LABRCNTIP (procalcitonin:4,lacticidven:4) ) Recent Results (from the past 240 hour(s))  SARS CORONAVIRUS 2 (TAT 6-24 HRS) Nasopharyngeal Nasopharyngeal Swab     Status: None   Collection Time: 01/19/20  8:03 PM   Specimen: Nasopharyngeal Swab  Result Value Ref Range Status   SARS Coronavirus 2 NEGATIVE NEGATIVE Final    Comment: (NOTE) SARS-CoV-2 target nucleic acids are NOT DETECTED. The SARS-CoV-2 RNA is generally detectable in upper and lower respiratory specimens during the acute phase of infection. Negative results do not preclude SARS-CoV-2 infection, do not rule out co-infections with other pathogens, and should not be used as the sole basis for treatment or other patient management decisions. Negative results must be combined with clinical observations, patient history, and epidemiological information. The expected result is Negative. Fact Sheet for Patients: SugarRoll.be Fact Sheet for Healthcare Providers: https://www.woods-mathews.com/ This test is not yet approved or cleared by the [place] FDA and  has been authorized for detection and/or diagnosis of SARS-CoV-2 by FDA under an Emergency Use Authorization (EUA). This EUA will remain  in effect (meaning this test can be used) for the duration of the COVID-19 declaration under Section 56 4(b)(1) of the Act, 21 U.S.C. section 360bbb-3(b)(1), unless the authorization is terminated or revoked sooner. Performed at [facility], [place] [address] [place], [place] [address]   Respiratory Panel by RT PCR (Flu A&B, Covid) - Nasopharyngeal Swab     Status: None   Collection Time: 01/29/20  3:18 PM   Specimen: Nasopharyngeal Swab  Result  Value Ref Range Status   SARS Coronavirus 2 by RT PCR NEGATIVE NEGATIVE Final    Comment: (NOTE) SARS-CoV-2 target nucleic acids are NOT DETECTED. The SARS-CoV-2 RNA is generally detectable in upper respiratoy specimens during the acute phase of infection. The lowest concentration of SARS-CoV-2 viral copies this  assay can detect is 131 copies/mL. A negative result does not preclude SARS-Cov-2 infection and should not be used as the sole basis for treatment or other patient management decisions. A negative result may occur with  improper specimen collection/handling, submission of specimen other than nasopharyngeal swab, presence of viral mutation(s) within the areas targeted by this assay, and inadequate number of viral copies (<131 copies/mL). A negative result must be combined with clinical observations, patient history, and epidemiological information. The expected result is Negative. Fact Sheet for Patients:  PinkCheek.be Fact Sheet for Healthcare Providers:  GravelBags.it This test is not yet ap proved or cleared by the [place] FDA and  has been authorized for detection and/or diagnosis of SARS-CoV-2 by FDA under an Emergency Use Authorization (EUA). This EUA will remain  in effect (meaning this test can be used) for the duration of the COVID-19 declaration under Section 564(b)(1) of the Act, 21 U.S.C. section 360bbb-3(b)(1), unless the authorization is terminated or revoked sooner.    Influenza A by PCR NEGATIVE NEGATIVE Final   Influenza B by PCR NEGATIVE NEGATIVE Final    Comment: (NOTE) The Xpert Xpress SARS-CoV-2/FLU/RSV assay is intended as an aid in  the diagnosis of influenza from Nasopharyngeal swab specimens and  should not be used as a sole basis for treatment. Nasal washings and  aspirates are unacceptable for Xpert Xpress SARS-CoV-2/FLU/RSV  testing. Fact Sheet for  Patients: PinkCheek.be Fact Sheet for Healthcare Providers: GravelBags.it This test is not yet approved or cleared by the [place] FDA and  has been authorized for detection and/or diagnosis of SARS-CoV-2 by  FDA under an Emergency Use Authorization (EUA). This EUA will remain  in effect (meaning this test can be used) for the duration of the  Covid-19 declaration under Section 564(b)(1) of the Act, 21  U.S.C. section 360bbb-3(b)(1), unless the authorization is  terminated or revoked. Performed at [facility], [address], [place], [place] [address]      Radiological Exams on Admission: DG Chest 2 View  Result Date: 01/29/2020 CLINICAL DATA:  Neutropenic fever EXAM: CHEST - 2 VIEW COMPARISON:  02/18/2018 FINDINGS: Cardiac shadows within normal limits. Aortic calcifications are again seen. The lungs are hyperinflated without focal infiltrate or sizable effusion. No acute bony abnormality is seen. IMPRESSION: COPD without acute abnormality. Aortic Atherosclerosis (ICD10-I70.0). Electronically Signed   By: Inez Catalina M.D.   On: 01/29/2020 15:53       Assessment/Plan Principal Problem:   Hyponatremia - with generalized weakness - sodium 128, U sodium < 10, U osm 252- labs suggestive of dehydration - likely due to recent gastroenteritis - she probably has not taken in enough solute since then - start NS at 50 cc/hr and follow sodium levels - check TSH and Free T4 as well  Active Problems:   GERD (gastroesophageal reflux disease) - cont PPI    History of IBS - follow for symptoms    Essential hypertension - cont Norvasc and Hydrazine    COPD (chronic obstructive pulmonary disease) - no wheezing noted - cont steroid inhaler    Anxiety -cont PRN Clonazepam and Norpramin    DVT prophylaxis: Lovenox  Code Status: Full code  Family Communication: cousin at bedside  Disposition Plan: from home   Consults called: none  Admission status: observation- possibly home tomorrow    Debbe Odea MD [facility] Pager: [email or web address] Password [email or web address] [phone], please contact night-coverage   01/29/2020, 7:09 PM

## 2020-01-29 NOTE — Progress Notes (Signed)
Anticoagulation monitoring(Lovenox):  85 yo female ordered Lovenox 40 mg Q24h  Filed Weights   01/29/20 1444 01/29/20 1800  Weight: 36.3 kg (80 lb) 34.2 kg (75 lb 4.8 oz)   BMI    Lab Results  Component Value Date   CREATININE 1.07 (H) 01/29/2020   CREATININE 0.80 01/28/2020   CREATININE 0.81 01/20/2020   Estimated Creatinine Clearance: 21.8 mL/min (A) (by C-G formula based on SCr of 1.07 mg/dL (H)). Hemoglobin & Hematocrit     Component Value Date/Time   HGB 12.5 01/29/2020 1558   HGB 12.8 10/06/2018 1116   HCT 37.0 01/29/2020 1558   HCT 37.5 10/06/2018 1116     Per Protocol for Patient with estCrcl < 30 ml/min and BMI < 40, will transition to Lovenox 30 mg Q24h.

## 2020-01-30 DIAGNOSIS — J449 Chronic obstructive pulmonary disease, unspecified: Secondary | ICD-10-CM | POA: Diagnosis not present

## 2020-01-30 DIAGNOSIS — K219 Gastro-esophageal reflux disease without esophagitis: Secondary | ICD-10-CM | POA: Diagnosis not present

## 2020-01-30 DIAGNOSIS — Z20822 Contact with and (suspected) exposure to covid-19: Secondary | ICD-10-CM | POA: Diagnosis not present

## 2020-01-30 DIAGNOSIS — Z882 Allergy status to sulfonamides status: Secondary | ICD-10-CM | POA: Diagnosis not present

## 2020-01-30 DIAGNOSIS — Z96641 Presence of right artificial hip joint: Secondary | ICD-10-CM | POA: Diagnosis not present

## 2020-01-30 DIAGNOSIS — Z8249 Family history of ischemic heart disease and other diseases of the circulatory system: Secondary | ICD-10-CM | POA: Diagnosis not present

## 2020-01-30 DIAGNOSIS — Z880 Allergy status to penicillin: Secondary | ICD-10-CM | POA: Diagnosis not present

## 2020-01-30 DIAGNOSIS — M810 Age-related osteoporosis without current pathological fracture: Secondary | ICD-10-CM | POA: Diagnosis not present

## 2020-01-30 DIAGNOSIS — E871 Hypo-osmolality and hyponatremia: Secondary | ICD-10-CM | POA: Diagnosis not present

## 2020-01-30 DIAGNOSIS — Z881 Allergy status to other antibiotic agents status: Secondary | ICD-10-CM | POA: Diagnosis not present

## 2020-01-30 DIAGNOSIS — Z888 Allergy status to other drugs, medicaments and biological substances status: Secondary | ICD-10-CM | POA: Diagnosis not present

## 2020-01-30 DIAGNOSIS — E039 Hypothyroidism, unspecified: Secondary | ICD-10-CM | POA: Diagnosis not present

## 2020-01-30 DIAGNOSIS — Z79899 Other long term (current) drug therapy: Secondary | ICD-10-CM | POA: Diagnosis not present

## 2020-01-30 DIAGNOSIS — Z87891 Personal history of nicotine dependence: Secondary | ICD-10-CM | POA: Diagnosis not present

## 2020-01-30 DIAGNOSIS — D709 Neutropenia, unspecified: Secondary | ICD-10-CM

## 2020-01-30 DIAGNOSIS — M1990 Unspecified osteoarthritis, unspecified site: Secondary | ICD-10-CM | POA: Diagnosis not present

## 2020-01-30 DIAGNOSIS — Z885 Allergy status to narcotic agent status: Secondary | ICD-10-CM | POA: Diagnosis not present

## 2020-01-30 DIAGNOSIS — K589 Irritable bowel syndrome without diarrhea: Secondary | ICD-10-CM | POA: Diagnosis not present

## 2020-01-30 DIAGNOSIS — R5081 Fever presenting with conditions classified elsewhere: Secondary | ICD-10-CM

## 2020-01-30 DIAGNOSIS — Z7951 Long term (current) use of inhaled steroids: Secondary | ICD-10-CM | POA: Diagnosis not present

## 2020-01-30 DIAGNOSIS — I10 Essential (primary) hypertension: Secondary | ICD-10-CM | POA: Diagnosis not present

## 2020-01-30 LAB — BASIC METABOLIC PANEL
Anion gap: 7 (ref 5–15)
Anion gap: 7 (ref 5–15)
BUN: 19 mg/dL (ref 8–23)
BUN: 16 mg/dL (ref 8–23)
CO2: 24 mmol/L (ref 22–32)
CO2: 24 mmol/L (ref 22–32)
Calcium: 9 mg/dL (ref 8.9–10.3)
Calcium: 9 mg/dL (ref 8.9–10.3)
Chloride: 101 mmol/L (ref 98–111)
Chloride: 103 mmol/L (ref 98–111)
Creatinine, Ser: 0.75 mg/dL (ref 0.44–1.00)
Creatinine, Ser: 0.71 mg/dL (ref 0.44–1.00)
GFR calc Af Amer: >60 mL/min (ref >60)
GFR calc Af Amer: >60 mL/min (ref >60)
GFR calc non Af Amer: >60 mL/min (ref >60)
GFR calc non Af Amer: >60 mL/min (ref >60)
Glucose, Bld: 82 mg/dL (ref 70–99)
Glucose, Bld: 91 mg/dL (ref 70–99)
Potassium: 4.1 mmol/L (ref 3.5–5.1)
Potassium: 3.9 mmol/L (ref 3.5–5.1)
Sodium: 132 mmol/L — ABNORMAL LOW (ref 135–145)
Sodium: 134 mmol/L — ABNORMAL LOW (ref 135–145)

## 2020-01-30 LAB — CBC
HCT: 31.8 % — ABNORMAL LOW (ref 36.0–46.0)
Hemoglobin: 10.8 g/dL — ABNORMAL LOW (ref 12.0–15.0)
MCH: 28.9 pg (ref 26.0–34.0)
MCHC: 34 g/dL (ref 30.0–36.0)
MCV: 85 fL (ref 80.0–100.0)
Platelets: 290 10*3/uL (ref 150–400)
RBC: 3.74 MIL/uL — ABNORMAL LOW (ref 3.87–5.11)
RDW: 14.9 % (ref 11.5–15.5)
WBC: 4.2 10*3/uL (ref 4.0–10.5)
nRBC: 0 % (ref 0.0–0.2)

## 2020-01-30 LAB — URINE CULTURE: Culture: <10000 — AB

## 2020-01-30 LAB — TSH: TSH: 5.179 u[IU]/mL — ABNORMAL HIGH (ref 0.350–4.500)

## 2020-01-30 LAB — T4, FREE: Free T4: 0.9 ng/dL (ref 0.61–1.12)

## 2020-01-30 NOTE — Care Management CC44 (Signed)
Condition Code 44 Documentation Completed  Patient Details  Name: Pam Cruz MRN: [id number] Date of Birth: 1934/12/16   Condition Code 44 given:  Yes Patient signature on Condition Code 44 notice:  Yes Documentation of 2 MD's agreement:  Yes Code 44 added to claim:  Yes    Elliot Gurney [place], [place] 01/30/2020, 1:47 PM

## 2020-01-30 NOTE — Discharge Summary (Signed)
Physician Discharge Summary  Pam Cruz [id number] DOB: 21-May-1935 DOA: 01/29/2020  PCP: Lavera Guise, MD  Admit date: 01/29/2020 Discharge date: 01/30/2020  Admitted From: home  Disposition:  home   Recommendations for Outpatient Follow-up:  1. Recheck sodium and TSH on Tues or [place]:  none  Discharge Condition:  stable   CODE STATUS:  Full code   Diet recommendation:  Regular diet- liberalize salt for a few days Consultations:  none  Procedures/Studies: . none   Discharge Diagnoses:  Principal Problem:   Hyponatremia Active Problems: Generalized weakness  GERD (gastroesophageal reflux disease)   History of IBS   Essential hypertension   COPD (chronic obstructive pulmonary disease) ([place])   Anxiety   Hypothyroidism   Brief Summary: Pam Cruz is frail  85 y.o. female with medical history of IBS, GERD, HTN, Anxiety who is sent from her PCP's office for a low sodium. On arrival, her Bmet shows a sodium of 128. The patient was admitted last week for gastroenteritis and hyponatremia and sodium improved to 128 on the day she was discharged. She states she drinks 32 oz of water as told by her PCP and not much else. She does not eat much but that is not out of the normal. Her weight today is the same as it was when discharged last week, about 75lbs. She did have nausea and some abdominal cramping even after being discharged but this resolved. The nausea resolved as of yesterday and the cramping a few days before. She feels weak and has no other symptoms. She did have a headache related to an eye injection for macular degeneration but this has resolved as well.    Hospital Course:  Principal Problem:   Hyponatremia - with generalized weakness - sodium 128, U sodium < 10, U osm 252- labs suggestive of dehydration - likely due to recent gastroenteritis - she probably has not taken in enough solute since then - start NS at 50 cc/hr- sodium has improved to 134  this afternoon. She has no weakness today. She ambulated in the hall and plans to take a shower before going home. I have asked her to liberalize her sodium intake for a few days and have her sodium checked again by Dr Humphrey Rolls in 3-4 day - checked TSH and Free T4 in relation to her sodium level  - TSH is elevated at 5.179, Free T 4 0.90 and hypothyroidism may be related to her hyponatremia-  recommend rechecking when sodium is checked and adjusting Synthroid as needed  Active Problems:   GERD (gastroesophageal reflux disease) - cont PPI- she is very careful with her diet to prevent symptoms    History of IBS - follow for symptoms- she had a normal BM today    Essential hypertension - cont Norvasc and Hydrazine    COPD (chronic obstructive pulmonary disease) - no wheezing noted - cont steroid inhaler    Anxiety -cont PRN Clonazepam and Norpramin    Discharge Exam: Vitals:   01/29/20 2012 01/30/20 0534  BP: 121/63 119/71  Pulse: 70 (!) 57  Resp: 18 15  Temp: 98.1 F (36.7 C) 97.7 F (36.5 C)  SpO2: 97% 97%   Vitals:   01/29/20 1444 01/29/20 1800 01/29/20 2012 01/30/20 0534  BP: (!) 147/81  121/63 119/71  Pulse: 60  70 (!) 57  Resp: 16  18 15   Temp: (!) 97.4 F (36.3 C)  98.1 F (36.7 C) 97.7 F (36.5 C)  TempSrc:  Oral  Oral Oral  SpO2: 97%  97% 97%  Weight: 36.3 kg 34.2 kg    Height: 4\' 7"  (1.397 m) 4\' 7"  (1.397 m)      General: Pt is alert, awake, not in acute distress Cardiovascular: RRR, S1/S2 +, no rubs, no gallops Respiratory: CTA bilaterally, no wheezing, no rhonchi Abdominal: Soft, NT, ND, bowel sounds + Extremities: no edema, no cyanosis   Discharge Instructions  Discharge Instructions    Diet general   Complete by: As directed    Regular diet, liberalize salt for a few days   Increase activity slowly   Complete by: As directed      Allergies as of 01/30/2020      Reactions   Ambien [zolpidem Tartrate]    Pt unsure    Amoxicillin Nausea  And Vomiting   Codeine    Other reaction(s): Abdominal Pain   Erythromycin Nausea And Vomiting   Penicillins Nausea And Vomiting   Reglan [metoclopramide] Nausea And Vomiting   Sulfa Antibiotics Nausea And Vomiting      Medication List    TAKE these medications   acetaminophen 500 MG tablet Commonly known as: TYLENOL Take 500 mg by mouth every 6 (six) hours as needed (pain).   amLODipine 2.5 MG tablet Commonly known as: NORVASC Take 1 tablet (2.5 mg total) by mouth daily.   Blink Tears 0.25 % Gel Generic drug: Polyethylene Glycol 400 Place 1-2 drops into both eyes as needed (dry eyes).   clonazePAM 0.5 MG tablet Commonly known as: KLONOPIN Take 1 tablet (0.5 mg total) by mouth 2 (two) times daily as needed. for anxiety   desipramine 25 MG tablet Commonly known as: NORPRAMIN Take 25 mg by mouth at bedtime.   fluticasone 50 MCG/ACT nasal spray Commonly known as: FLONASE Place 1-2 sprays into both nostrils daily.   hydrALAZINE 25 MG tablet Commonly known as: APRESOLINE Take 1 tablet (25 mg total) by mouth 3 (three) times daily. What changed: how much to take   levothyroxine 50 MCG tablet Commonly known as: SYNTHROID TAKE ONE-HALF TABLET BY  MOUTH ONCE A DAY ON AN  EMPTY STOMACH What changed: See the new instructions.   losartan 50 MG tablet Commonly known as: COZAAR Take 1 tablet (50 mg total) by mouth 2 (two) times daily.   omeprazole 20 MG capsule Commonly known as: PRILOSEC Take 20 mg by mouth 2 (two) times daily before a meal.   ondansetron 4 MG tablet Commonly known as: ZOFRAN Take 1 tablet (4 mg total) by mouth every 8 (eight) hours as needed for nausea or vomiting.   PreserVision AREDS 2+Multi Vit Caps Take 1 capsule by mouth in the morning and at bedtime.   sennosides-docusate sodium 8.6-50 MG tablet Commonly known as: SENOKOT-S Take 1-2 tablets by mouth at bedtime.   terbinafine 1 % cream Commonly known as: LAMISIL Apply 1 application topically  2 (two) times daily as needed (toes).   Theraworx Relief Foam Apply 2 application topically See admin instructions. Apply two applications to various areas of the body at bedtime according to bottle instructions   Wixela Inhub 250-50 MCG/DOSE Aepb Generic drug: Fluticasone-Salmeterol Inhale 1 puff into the lungs 2 (two) times daily.       Allergies  Allergen Reactions  . Ambien [Zolpidem Tartrate]     Pt unsure   . Amoxicillin Nausea And Vomiting  . Codeine     Other reaction(s): Abdominal Pain  . Erythromycin Nausea And Vomiting  . Penicillins Nausea And  Vomiting  . Reglan [Metoclopramide] Nausea And Vomiting  . Sulfa Antibiotics Nausea And Vomiting      DG Chest 2 View  Result Date: 01/29/2020 CLINICAL DATA:  Neutropenic fever EXAM: CHEST - 2 VIEW COMPARISON:  02/18/2018 FINDINGS: Cardiac shadows within normal limits. Aortic calcifications are again seen. The lungs are hyperinflated without focal infiltrate or sizable effusion. No acute bony abnormality is seen. IMPRESSION: COPD without acute abnormality. Aortic Atherosclerosis (ICD10-I70.0). Electronically Signed   By: Inez Catalina M.D.   On: 01/29/2020 15:53     The results of significant diagnostics from this hospitalization (including imaging, microbiology, ancillary and laboratory) are listed below for reference.     Microbiology: Recent Results (from the past 240 hour(s))  Respiratory Panel by RT PCR (Flu A&B, Covid) - Nasopharyngeal Swab     Status: None   Collection Time: 01/29/20  3:18 PM   Specimen: Nasopharyngeal Swab  Result Value Ref Range Status   SARS Coronavirus 2 by RT PCR NEGATIVE NEGATIVE Final    Comment: (NOTE) SARS-CoV-2 target nucleic acids are NOT DETECTED. The SARS-CoV-2 RNA is generally detectable in upper respiratoy specimens during the acute phase of infection. The lowest concentration of SARS-CoV-2 viral copies this assay can detect is 131 copies/mL. A negative result does not preclude  SARS-Cov-2 infection and should not be used as the sole basis for treatment or other patient management decisions. A negative result may occur with  improper specimen collection/handling, submission of specimen other than nasopharyngeal swab, presence of viral mutation(s) within the areas targeted by this assay, and inadequate number of viral copies (<131 copies/mL). A negative result must be combined with clinical observations, patient history, and epidemiological information. The expected result is Negative. Fact Sheet for Patients:  PinkCheek.be Fact Sheet for Healthcare Providers:  GravelBags.it This test is not yet ap proved or cleared by the [place] FDA and  has been authorized for detection and/or diagnosis of SARS-CoV-2 by FDA under an Emergency Use Authorization (EUA). This EUA will remain  in effect (meaning this test can be used) for the duration of the COVID-19 declaration under Section 564(b)(1) of the Act, 21 U.S.C. section 360bbb-3(b)(1), unless the authorization is terminated or revoked sooner.    Influenza A by PCR NEGATIVE NEGATIVE Final   Influenza B by PCR NEGATIVE NEGATIVE Final    Comment: (NOTE) The Xpert Xpress SARS-CoV-2/FLU/RSV assay is intended as an aid in  the diagnosis of influenza from Nasopharyngeal swab specimens and  should not be used as a sole basis for treatment. Nasal washings and  aspirates are unacceptable for Xpert Xpress SARS-CoV-2/FLU/RSV  testing. Fact Sheet for Patients: PinkCheek.be Fact Sheet for Healthcare Providers: GravelBags.it This test is not yet approved or cleared by the [place] FDA and  has been authorized for detection and/or diagnosis of SARS-CoV-2 by  FDA under an Emergency Use Authorization (EUA). This EUA will remain  in effect (meaning this test can be used) for the duration of the  Covid-19  declaration under Section 564(b)(1) of the Act, 21  U.S.C. section 360bbb-3(b)(1), unless the authorization is  terminated or revoked. Performed at [facility], [address] [place], [place] [address]   Culture, blood (Routine X 2) w Reflex to ID Panel     Status: None (Preliminary result)   Collection Time: 01/29/20  3:58 PM   Specimen: BLOOD  Result Value Ref Range Status   Specimen Description BLOOD BLOOD RIGHT HAND  Final   Special Requests   Final  BOTTLES DRAWN AEROBIC AND ANAEROBIC Blood Culture adequate volume   Culture   Final    NO GROWTH < 24 HOURS Performed at [facility], [address] [place], [place] [address]    Report Status PENDING  Incomplete  Culture, blood (Routine X 2) w Reflex to ID Panel     Status: None (Preliminary result)   Collection Time: 01/29/20  3:58 PM   Specimen: BLOOD  Result Value Ref Range Status   Specimen Description BLOOD RIGHT ANTECUBITAL  Final   Special Requests   Final    BOTTLES DRAWN AEROBIC AND ANAEROBIC Blood Culture adequate volume   Culture   Final    NO GROWTH < 24 HOURS Performed at [facility], [address] [place], [place] [address]    Report Status PENDING  Incomplete     Labs: BNP (last 3 results) No results for input(s): BNP in the last 8760 hours. Basic Metabolic Panel: Recent Labs  Lab 01/28/20 1500 01/29/20 1558 01/30/20 0347 01/30/20 1201  NA 123* 128* 132* 134*  K 4.4 4.2 4.1 3.9  CL 83* 94* 101 103  CO2 23 22 24 24   GLUCOSE 105* 89 82 91  BUN 17 23 19 16   CREATININE 0.80 1.07* 0.75 0.71  CALCIUM 10.2 10.1 9.0 9.0  MG 2.0  --   --   --    Liver Function Tests: Recent Labs  Lab 01/29/20 1558  AST 29  ALT 21  ALKPHOS 80  BILITOT 0.8  PROT 7.0  ALBUMIN 4.8   No results for input(s): LIPASE, AMYLASE in the last 168 hours. No results for input(s): AMMONIA in the last 168 hours. CBC: Recent Labs  Lab 01/29/20 1558 01/30/20 0347  WBC 7.7 4.2  HGB  12.5 10.8*  HCT 37.0 31.8*  MCV 85.1 85.0  PLT 349 290   Cardiac Enzymes: No results for input(s): CKTOTAL, CKMB, CKMBINDEX, TROPONINI in the last 168 hours. BNP: Invalid input(s): POCBNP CBG: No results for input(s): GLUCAP in the last 168 hours. D-Dimer No results for input(s): DDIMER in the last 72 hours. Hgb A1c No results for input(s): HGBA1C in the last 72 hours. Lipid Profile No results for input(s): CHOL, HDL, LDLCALC, TRIG, CHOLHDL, LDLDIRECT in the last 72 hours. Thyroid function studies Recent Labs    01/30/20 0347  TSH 5.179*   Anemia work up Recent Labs    01/28/20 1500  VITAMINB12 402  FOLATE 15.9  FERRITIN 46  TIBC 399  IRON 146*   Urinalysis    Component Value Date/Time   COLORURINE YELLOW (A) 01/29/2020 1753   APPEARANCEUR HAZY (A) 01/29/2020 1753   APPEARANCEUR Clear 03/10/2019 1132   LABSPEC 1.008 01/29/2020 1753   LABSPEC 1.010 05/03/2014 2105   PHURINE 5.0 01/29/2020 1753   GLUCOSEU NEGATIVE 01/29/2020 1753   GLUCOSEU Negative 05/03/2014 2105   HGBUR NEGATIVE 01/29/2020 1753   [place] 01/29/2020 1753   BILIRUBINUR Negative 03/10/2019 1132   BILIRUBINUR Negative 05/03/2014 2105   KETONESUR NEGATIVE 01/29/2020 1753   PROTEINUR 30 (A) 01/29/2020 1753   NITRITE NEGATIVE 01/29/2020 1753   LEUKOCYTESUR NEGATIVE 01/29/2020 1753   LEUKOCYTESUR Negative 05/03/2014 2105   Sepsis Labs Invalid input(s): PROCALCITONIN,  WBC,  LACTICIDVEN Microbiology Recent Results (from the past 240 hour(s))  Respiratory Panel by RT PCR (Flu A&B, Covid) - Nasopharyngeal Swab     Status: None   Collection Time: 01/29/20  3:18 PM   Specimen: Nasopharyngeal Swab  Result Value Ref Range Status  SARS Coronavirus 2 by RT PCR NEGATIVE NEGATIVE Final    Comment: (NOTE) SARS-CoV-2 target nucleic acids are NOT DETECTED. The SARS-CoV-2 RNA is generally detectable in upper respiratoy specimens during the acute phase of infection. The lowest concentration  of SARS-CoV-2 viral copies this assay can detect is 131 copies/mL. A negative result does not preclude SARS-Cov-2 infection and should not be used as the sole basis for treatment or other patient management decisions. A negative result may occur with  improper specimen collection/handling, submission of specimen other than nasopharyngeal swab, presence of viral mutation(s) within the areas targeted by this assay, and inadequate number of viral copies (<131 copies/mL). A negative result must be combined with clinical observations, patient history, and epidemiological information. The expected result is Negative. Fact Sheet for Patients:  PinkCheek.be Fact Sheet for Healthcare Providers:  GravelBags.it This test is not yet ap proved or cleared by the [place] FDA and  has been authorized for detection and/or diagnosis of SARS-CoV-2 by FDA under an Emergency Use Authorization (EUA). This EUA will remain  in effect (meaning this test can be used) for the duration of the COVID-19 declaration under Section 564(b)(1) of the Act, 21 U.S.C. section 360bbb-3(b)(1), unless the authorization is terminated or revoked sooner.    Influenza A by PCR NEGATIVE NEGATIVE Final   Influenza B by PCR NEGATIVE NEGATIVE Final    Comment: (NOTE) The Xpert Xpress SARS-CoV-2/FLU/RSV assay is intended as an aid in  the diagnosis of influenza from Nasopharyngeal swab specimens and  should not be used as a sole basis for treatment. Nasal washings and  aspirates are unacceptable for Xpert Xpress SARS-CoV-2/FLU/RSV  testing. Fact Sheet for Patients: PinkCheek.be Fact Sheet for Healthcare Providers: GravelBags.it This test is not yet approved or cleared by the [place] FDA and  has been authorized for detection and/or diagnosis of SARS-CoV-2 by  FDA under an Emergency Use Authorization (EUA).  This EUA will remain  in effect (meaning this test can be used) for the duration of the  Covid-19 declaration under Section 564(b)(1) of the Act, 21  U.S.C. section 360bbb-3(b)(1), unless the authorization is  terminated or revoked. Performed at [facility], [address] [place], [place] [address]   Culture, blood (Routine X 2) w Reflex to ID Panel     Status: None (Preliminary result)   Collection Time: 01/29/20  3:58 PM   Specimen: BLOOD  Result Value Ref Range Status   Specimen Description BLOOD BLOOD RIGHT HAND  Final   Special Requests   Final    BOTTLES DRAWN AEROBIC AND ANAEROBIC Blood Culture adequate volume   Culture   Final    NO GROWTH < 24 HOURS Performed at [facility], [address], [place], [place] [address]    Report Status PENDING  Incomplete  Culture, blood (Routine X 2) w Reflex to ID Panel     Status: None (Preliminary result)   Collection Time: 01/29/20  3:58 PM   Specimen: BLOOD  Result Value Ref Range Status   Specimen Description BLOOD RIGHT ANTECUBITAL  Final   Special Requests   Final    BOTTLES DRAWN AEROBIC AND ANAEROBIC Blood Culture adequate volume   Culture   Final    NO GROWTH < 24 HOURS Performed at [facility], [address], [place], [place] [address]    Report Status PENDING  Incomplete     Time coordinating discharge in minutes: 65  SIGNED:   Debbe Odea, MD  [facility] 01/30/2020, 1:13 PM

## 2020-01-30 NOTE — Procedures (Signed)
[place], [place] [address]  DATE OF SERVICE: January 08, 2020  CAROTID DOPPLER INTERPRETATION:  Bilateral Carotid Ultrsasound and Color Doppler Examination was performed. The RIGHT CCA shows moderate plaque in the vessel. The LEFT CCA shows moderate plaque in the vessel. There was no significant intimal thickening noted in the RIGHT carotid artery. There was no significant intimal thickening in the LEFT carotid artery.  The RIGHT CCA shows peak systolic velocity of 71 cm per second. The end diastolic velocity is 13 cm per second on the RIGHT side. The RIGHT ICA shows peak systolic velocity of 72 per second. RIGHT sided ICA end diastolic velocity is 18 cm per second. The RIGHT ECA shows a peak systolic velocity of 96 cm per second. The ICA/CCA ratio is calculated to be 1.02. This suggests less than 50% stenosis. The Vertebral Artery shows antegrade flow.  The LEFT CCA shows peak systolic velocity of 51 cm per second. The end diastolic velocity is 14 cm per second on the LEFT side. The LEFT ICA shows peak systolic velocity of 71 per second. LEFT sided ICA end diastolic velocity is 22 cm per second. The LEFT ECA shows a peak systolic velocity of 51 cm per second. The ICA/CCA ratio is calculated to be 1.39. This suggests less than 50% stenosis. The Vertebral Artery shows antegrade flow.   Impression:    The RIGHT CAROTID shows less than 50% stenosis. The LEFT CAROTID shows less than 50% stenosis.  There is moderate degree of plaque formation noted on the LEFT and moderate degree of plaque on the RIGHT  side. Consider a repeat Carotid doppler if clinical situation and symptoms warrant in 6-12 months. Patient should be encouraged to change lifestyles such as smoking cessation, regular exercise and dietary modification. Use of statins in the right clinical setting and ASA is encouraged.  Allyne Gee, MD [facility] Pulmonary Critical Care Medicine

## 2020-01-30 NOTE — Progress Notes (Signed)
Pt discharged per MD order. IV removed. Discharge instructions reviewed with pt. Pt verbalized understanding with all questions answered to her satisfaction. Pt taken to car in wheelchair by staff.

## 2020-02-01 NOTE — Progress Notes (Signed)
Pt will need hospital follow up, I can do televisit

## 2020-02-02 ENCOUNTER — Other Ambulatory Visit: Payer: Self-pay

## 2020-02-02 ENCOUNTER — Ambulatory Visit (INDEPENDENT_AMBULATORY_CARE_PROVIDER_SITE_OTHER): Payer: Medicare Other | Admitting: Internal Medicine

## 2020-02-02 ENCOUNTER — Encounter: Payer: Self-pay | Admitting: Internal Medicine

## 2020-02-02 DIAGNOSIS — I10 Essential (primary) hypertension: Secondary | ICD-10-CM | POA: Diagnosis not present

## 2020-02-02 DIAGNOSIS — E871 Hypo-osmolality and hyponatremia: Secondary | ICD-10-CM | POA: Diagnosis not present

## 2020-02-02 DIAGNOSIS — Z96641 Presence of right artificial hip joint: Secondary | ICD-10-CM | POA: Diagnosis not present

## 2020-02-02 DIAGNOSIS — F411 Generalized anxiety disorder: Secondary | ICD-10-CM

## 2020-02-02 DIAGNOSIS — G2581 Restless legs syndrome: Secondary | ICD-10-CM

## 2020-02-02 DIAGNOSIS — E039 Hypothyroidism, unspecified: Secondary | ICD-10-CM | POA: Diagnosis not present

## 2020-02-02 DIAGNOSIS — M1611 Unilateral primary osteoarthritis, right hip: Secondary | ICD-10-CM | POA: Diagnosis not present

## 2020-02-02 MED ORDER — ROPINIROLE HCL 0.25 MG PO TABS: ORAL_TABLET | ORAL | 3 refills | Status: DC

## 2020-02-02 NOTE — Progress Notes (Signed)
[facility] [place], [place] [address]  Internal MEDICINE  Office Visit Note  Patient Name: Pam Cruz  [id number]  [id number]  Date of Service: 02/03/2020   Chief Complaint  Patient presents with  . Hospitalization Follow-up  . Hypertension  . Anxiety   HPI Pt is here for recent hospital follow up. Pt hospitalized for ongoing low sodium, she was admitted with sodium of 123, pt was started on therapy and was discharged after one day. This was her second admission for hypernatronemia. Her appetite is improved, feels better. BP is also better. Recent sodium is 132.  Pt is taking low dose antihypertensives( 3 of them) to avoid side effects per pt. She does have anxiety and takes things seriously, her recent TSH was slightly elevated with normal T4. No fever or chills, no n/v/d  She does c/o having restless legs at night Current Medication: Outpatient Encounter Medications as of 02/02/2020  Medication Sig Note  . acetaminophen (TYLENOL) 500 MG tablet Take 500 mg by mouth every 6 (six) hours as needed (pain).    Marland Kitchen amLODipine (NORVASC) 2.5 MG tablet Take 1 tablet (2.5 mg total) by mouth daily.   . clonazePAM (KLONOPIN) 0.5 MG tablet Take 1 tablet (0.5 mg total) by mouth 2 (two) times daily as needed. for anxiety   . desipramine (NORPRAMIN) 25 MG tablet Take 25 mg by mouth at bedtime.    . fluticasone (FLONASE) 50 MCG/ACT nasal spray Place 1-2 sprays into both nostrils daily.    . Fluticasone-Salmeterol (WIXELA INHUB) 250-50 MCG/DOSE AEPB Inhale 1 puff into the lungs 2 (two) times daily.   . Homeopathic Products ([place]) FOAM Apply 2 application topically See admin instructions. Apply two applications to various areas of the body at bedtime according to bottle instructions 01/29/2020: Medication at bedside   . losartan (COZAAR) 50 MG tablet Take 1 tablet (50 mg total) by mouth 2 (two) times daily.   . Multiple Vitamins-Minerals (PRESERVISION AREDS 2+MULTI  VIT) CAPS Take 1 capsule by mouth in the morning and at bedtime.   Marland Kitchen omeprazole (PRILOSEC) 20 MG capsule Take 20 mg by mouth 2 (two) times daily before a meal.    . ondansetron (ZOFRAN) 4 MG tablet Take 1 tablet (4 mg total) by mouth every 8 (eight) hours as needed for nausea or vomiting.   . Polyethylene Glycol 400 (BLINK TEARS) 0.25 % GEL Place 1-2 drops into both eyes as needed (dry eyes). 01/29/2020: Medication at bedside  . sennosides-docusate sodium (SENOKOT-S) 8.6-50 MG tablet Take 1-2 tablets by mouth at bedtime.    . terbinafine (LAMISIL) 1 % cream Apply 1 application topically 2 (two) times daily as needed (toes).    . [DISCONTINUED] hydrALAZINE (APRESOLINE) 25 MG tablet Take 1 tablet (25 mg total) by mouth 3 (three) times daily. (Patient taking differently: Take 12.5 mg by mouth 3 (three) times daily. )   . [DISCONTINUED] levothyroxine (SYNTHROID) 50 MCG tablet TAKE ONE-HALF TABLET BY  MOUTH ONCE A DAY ON AN  EMPTY STOMACH (Patient taking differently: Take 25 mcg by mouth daily. )   . hydrALAZINE (APRESOLINE) 25 MG tablet Take 1 tablet (25 mg total) by mouth 3 (three) times daily.   Marland Kitchen levothyroxine (SYNTHROID) 50 MCG tablet Take 1 tablet (50 mcg total) by mouth daily.   Marland Kitchen rOPINIRole (REQUIP) 0.25 MG tablet Take one tab po qhs for leg cramps    No facility-administered encounter medications on file as of 02/02/2020.    Surgical History:  Past Surgical History:  Procedure Laterality Date  . COLONOSCOPY    . cysto    . LAPAROSCOPY    . TOTAL HIP ARTHROPLASTY Right 08/17/2019   Procedure: TOTAL HIP ARTHROPLASTY;  Surgeon: Dereck Leep, MD;  Location: [facility] ORS;  Service: Orthopedics;  Laterality: Right;    Medical History: Past Medical History:  Diagnosis Date  . Anxiety   . Arthritis   . COPD (chronic obstructive pulmonary disease) ([place])   . Dyspnea   . Gastritis   . GERD (gastroesophageal reflux disease)   . Hypertension   . Osteoporosis     Family History: Family  History  Problem Relation Age of Onset  . Hypertension Mother   . Coronary artery disease Father   . Heart disease Father     Social History   Socioeconomic History  . Marital status: Single    Spouse name: Not on file  . Number of children: Not on file  . Years of education: Not on file  . Highest education level: Not on file  Occupational History  . Not on file  Tobacco Use  . Smoking status: Former Smoker    Quit date: 02/20/2001    Years since quitting: 18.9  . Smokeless tobacco: Never Used  . Tobacco comment: quit 17 years ago  Substance and Sexual Activity  . Alcohol use: Yes    Alcohol/week: 2.0 standard drinks    Types: 2 Glasses of wine per week    Comment: very rarely  . Drug use: Never  . Sexual activity: Not on file  Other Topics Concern  . Not on file  Social History Narrative  . Not on file   Social Determinants of Health   Financial Resource Strain:   . Difficulty of Paying Living Expenses:   Food Insecurity:   . Worried About Charity fundraiser in the Last Year:   . Arboriculturist in the Last Year:   Transportation Needs:   . Film/video editor (Medical):   Marland Kitchen Lack of Transportation (Non-Medical):   Physical Activity:   . Days of Exercise per Week:   . Minutes of Exercise per Session:   Stress:   . Feeling of Stress :   Social Connections:   . Frequency of Communication with Friends and Family:   . Frequency of Social Gatherings with Friends and Family:   . Attends Religious Services:   . Active Member of Clubs or Organizations:   . Attends Archivist Meetings:   Marland Kitchen Marital Status:   Intimate Partner Violence:   . Fear of Current or Ex-Partner:   . Emotionally Abused:   Marland Kitchen Physically Abused:   . Sexually Abused:    Review of Systems  Constitutional: Positive for fatigue. Negative for chills and diaphoresis.  HENT: Negative for ear pain, postnasal drip and sinus pressure.   Eyes: Negative for photophobia, discharge, redness,  itching and visual disturbance.  Respiratory: Negative for cough, shortness of breath and wheezing.   Cardiovascular: Negative for chest pain, palpitations and leg swelling.  Gastrointestinal: Negative for abdominal pain, constipation, diarrhea, nausea and vomiting.  Genitourinary: Negative for dysuria and flank pain.  Musculoskeletal: Negative for arthralgias, back pain, gait problem and neck pain.  Skin: Negative for color change.  Allergic/Immunologic: Negative for environmental allergies and food allergies.  Neurological: Negative for dizziness and headaches.  Hematological: Does not bruise/bleed easily.  Psychiatric/Behavioral: Negative for agitation, behavioral problems (depression) and hallucinations.   Vital Signs: BP (!) 148/80  Pulse 86   Temp 97.7 F (36.5 C)   Resp 16   Ht 4\' 7"  (1.397 m)   Wt 79 lb (35.8 kg)   SpO2 98%   BMI 18.36 kg/m   Physical Exam Constitutional:      General: She is not in acute distress.    Appearance: She is well-developed. She is not diaphoretic.  HENT:     Head: Normocephalic and atraumatic.     Mouth/Throat:     Pharynx: No oropharyngeal exudate.  Eyes:     Pupils: Pupils are equal, round, and reactive to light.  Neck:     Thyroid: No thyromegaly.     Vascular: No JVD.     Trachea: No tracheal deviation.  Cardiovascular:     Rate and Rhythm: Normal rate and regular rhythm.     Heart sounds: Normal heart sounds. No murmur. No friction rub. No gallop.   Pulmonary:     Effort: Pulmonary effort is normal. No respiratory distress.     Breath sounds: No wheezing or rales.  Chest:     Chest wall: No tenderness.  Abdominal:     General: Bowel sounds are normal.     Palpations: Abdomen is soft.  Musculoskeletal:        General: Normal range of motion.     Cervical back: Normal range of motion and neck supple.  Lymphadenopathy:     Cervical: No cervical adenopathy.  Skin:    General: Skin is warm and dry.  Neurological:      Mental Status: She is alert and oriented to person, place, and time.     Cranial Nerves: No cranial nerve deficit.  Psychiatric:        Behavior: Behavior normal.        Thought Content: Thought content normal.        Judgment: Judgment normal.    Assessment/Plan: 1. Hyponatremia - Hypo-osmolar hyponatremia, pt was given IVF with improvment in her symptoms, feels less weak  - Basic metabolic panel, will recheck in one week   2. Essential hypertension, benign - Controlled at this time, continue Norvasc 2.5 qd, hydralazine tid and Losartan 50 mg bid ( pt likes split dose)  3. Hypothyroidism, unspecified type - Synthroid 50 mcg po qd and recheck her TSH in 4-6 weeks   4. Restless leg syndrome - Pending Ferritin level, will start requip  - rOPINIRole (REQUIP) 0.25 MG tablet; Take one tab po qhs for leg cramps  Dispense: 90 tablet; Refill: 3  5. Generalized anxiety disorder - seems to be improved, will continue therapy as before   General Counseling: kimba renfrew understanding of the findings of todays visit and agrees with plan of treatment. I have discussed any further diagnostic evaluation that may be needed or ordered today. We also reviewed her medications today. she has been encouraged to call the office with any questions or concerns that should arise related to todays visit.  Orders Placed This Encounter  Procedures  . Basic metabolic panel   Meds ordered this encounter  Medications  . rOPINIRole (REQUIP) 0.25 MG tablet    Sig: Take one tab po qhs for leg cramps    Dispense:  90 tablet    Refill:  3  . levothyroxine (SYNTHROID) 50 MCG tablet    Sig: Take 1 tablet (50 mcg total) by mouth daily.    Dispense:  90 tablet    Refill:  3  . hydrALAZINE (APRESOLINE) 25 MG tablet  Sig: Take 1 tablet (25 mg total) by mouth 3 (three) times daily.    Dispense:  270 tablet    Refill:  3   I have reviewed all medical records from hospital follow up including radiology  reports and consults from other physicians. Appropriate follow up diagnostics will be scheduled as needed. Patient/ Family understands the plan of treatment. Time spent 40 minutes.  Dr Lavera Guise, MD Internal Medicine

## 2020-02-03 LAB — CULTURE, BLOOD (ROUTINE X 2)
Culture: NO GROWTH
Culture: NO GROWTH
Special Requests: ADEQUATE
Special Requests: ADEQUATE

## 2020-02-03 MED ORDER — LEVOTHYROXINE SODIUM 50 MCG PO TABS: 50.0000 ug | ORAL_TABLET | Freq: Every day | ORAL | 3 refills | Status: DC

## 2020-02-03 MED ORDER — HYDRALAZINE HCL 25 MG PO TABS: 25.0000 mg | ORAL_TABLET | Freq: Three times a day (TID) | ORAL | 3 refills | Status: DC

## 2020-02-05 ENCOUNTER — Other Ambulatory Visit: Payer: Self-pay | Admitting: Adult Health

## 2020-02-08 DIAGNOSIS — I10 Essential (primary) hypertension: Secondary | ICD-10-CM | POA: Diagnosis not present

## 2020-02-09 ENCOUNTER — Telehealth: Payer: Self-pay

## 2020-02-09 ENCOUNTER — Ambulatory Visit: Payer: Medicare Other | Admitting: Internal Medicine

## 2020-02-09 LAB — BASIC METABOLIC PANEL
BUN/Creatinine Ratio: 20 (ref 12–28)
BUN: 15 mg/dL (ref 8–27)
CO2: 25 mmol/L (ref 20–29)
Calcium: 9.8 mg/dL (ref 8.7–10.3)
Chloride: 100 mmol/L (ref 96–106)
Creatinine, Ser: 0.75 mg/dL (ref 0.57–1.00)
GFR calc Af Amer: 86 mL/min/{1.73_m2} (ref >59)
GFR calc non Af Amer: 74 mL/min/{1.73_m2} (ref >59)
Glucose: 82 mg/dL (ref 65–99)
Potassium: 4.4 mmol/L (ref 3.5–5.2)
Sodium: 138 mmol/L (ref 134–144)

## 2020-02-09 NOTE — Telephone Encounter (Signed)
-----   Message from Lavera Guise, MD sent at 02/09/2020  9:20 AM EDT ----- Please notify pt her labs are normal

## 2020-02-09 NOTE — Telephone Encounter (Signed)
Pt was notified labs are normal.  Pt wanted to know if it is okay to take a whole tab of levothyroxine, because she read that levothyroxine may interact with people with eye problems and pt is currently taking injections for an eye issue. Pt also wanted to know when she should take levothyroxine because GI doctor recommended that she should not take omeprazole and levothyroxine together. Pt also stated that she does not usually have three meals a day and is supposed to take omeprazole 2 times daily before a meal.   Spoke with Dr. Humphrey Rolls and advised pt that it is okay to take levothyroxine because it will not cause any issue with her specific eye problem. Also advised pt per Dr. Humphrey Rolls that pt should take levothyroxine 30 minutes before her first meal and then take omeprazole after first meal.

## 2020-02-09 NOTE — Progress Notes (Signed)
Please notify pt her labs are normal

## 2020-02-22 ENCOUNTER — Other Ambulatory Visit: Payer: Self-pay | Admitting: Adult Health

## 2020-02-24 DIAGNOSIS — L603 Nail dystrophy: Secondary | ICD-10-CM | POA: Diagnosis not present

## 2020-02-24 DIAGNOSIS — M79674 Pain in right toe(s): Secondary | ICD-10-CM | POA: Diagnosis not present

## 2020-02-24 DIAGNOSIS — M79675 Pain in left toe(s): Secondary | ICD-10-CM | POA: Diagnosis not present

## 2020-02-25 ENCOUNTER — Telehealth: Payer: Self-pay

## 2020-02-25 NOTE — Telephone Encounter (Signed)
Lmom to confirm and screen for 03-01-20 ov. 

## 2020-02-26 ENCOUNTER — Other Ambulatory Visit: Payer: Self-pay | Admitting: Internal Medicine

## 2020-02-29 ENCOUNTER — Other Ambulatory Visit: Payer: Self-pay | Admitting: Adult Health

## 2020-03-01 ENCOUNTER — Encounter: Payer: Self-pay | Admitting: Internal Medicine

## 2020-03-01 ENCOUNTER — Other Ambulatory Visit: Payer: Self-pay

## 2020-03-01 ENCOUNTER — Ambulatory Visit (INDEPENDENT_AMBULATORY_CARE_PROVIDER_SITE_OTHER): Payer: Medicare Other | Admitting: Internal Medicine

## 2020-03-01 VITALS — BP 142/84 | HR 74 | Temp 97.1°F | Resp 16 | Ht <= 58 in | Wt 80.4 lb

## 2020-03-01 DIAGNOSIS — H35051 Retinal neovascularization, unspecified, right eye: Secondary | ICD-10-CM | POA: Diagnosis not present

## 2020-03-01 DIAGNOSIS — E871 Hypo-osmolality and hyponatremia: Secondary | ICD-10-CM | POA: Diagnosis not present

## 2020-03-01 DIAGNOSIS — E039 Hypothyroidism, unspecified: Secondary | ICD-10-CM | POA: Diagnosis not present

## 2020-03-01 DIAGNOSIS — I10 Essential (primary) hypertension: Secondary | ICD-10-CM | POA: Diagnosis not present

## 2020-03-01 NOTE — Progress Notes (Signed)
[facility] [place], [place] [address]  Internal MEDICINE  Office Visit Note  Patient Name: Pam Cruz  [id number]  [id number]  Date of Service: 03/01/2020  Chief Complaint  Patient presents with  . Hypertension  . Anxiety    HPI Patient is here for routine follow-up. She has had two recent episodes of hyponatremia. Most recent sodium level 138. No complaints of weakness or dizziness.  Blood pressure is stable today. She was surprised at how good her BP as she explained she has lots of anxiety due to having to go have injections put into her eyes. States her restless leg has been much improved since starting on Requip. Denies chest pain, headache or shortness of breath.   Current Medication: Outpatient Encounter Medications as of 03/01/2020  Medication Sig Note  . acetaminophen (TYLENOL) 500 MG tablet Take 500 mg by mouth every 6 (six) hours as needed (pain).    Marland Kitchen amLODipine (NORVASC) 2.5 MG tablet Take 1 tablet (2.5 mg total) by mouth daily.   . clonazePAM (KLONOPIN) 0.5 MG tablet TAKE 1 TABLET BY MOUTH  TWICE DAILY AS NEEDED FOR  ANXIETY   . desipramine (NORPRAMIN) 25 MG tablet Take 25 mg by mouth at bedtime.    . fluticasone (FLONASE) 50 MCG/ACT nasal spray USE 2 SPRAYS IN BOTH  NOSTRILS AT NIGHT   . Fluticasone-Salmeterol (WIXELA INHUB) 250-50 MCG/DOSE AEPB Inhale 1 puff into the lungs 2 (two) times daily.   . Homeopathic Products ([place]) FOAM Apply 2 application topically See admin instructions. Apply two applications to various areas of the body at bedtime according to bottle instructions 01/29/2020: Medication at bedside   . hydrALAZINE (APRESOLINE) 25 MG tablet Take 1 tablet (25 mg total) by mouth 3 (three) times daily.   Marland Kitchen levothyroxine (SYNTHROID) 50 MCG tablet Take 1 tablet (50 mcg total) by mouth daily.   Marland Kitchen losartan (COZAAR) 50 MG tablet TAKE 1 TABLET BY MOUTH  TWICE DAILY   . Multiple Vitamins-Minerals (PRESERVISION AREDS 2+MULTI  VIT) CAPS Take 1 capsule by mouth in the morning and at bedtime.   Marland Kitchen omeprazole (PRILOSEC) 20 MG capsule Take 20 mg by mouth 2 (two) times daily before a meal.    . ondansetron (ZOFRAN) 4 MG tablet Take 1 tablet (4 mg total) by mouth every 8 (eight) hours as needed for nausea or vomiting.   . Polyethylene Glycol 400 (BLINK TEARS) 0.25 % GEL Place 1-2 drops into both eyes as needed (dry eyes). 01/29/2020: Medication at bedside  . rOPINIRole (REQUIP) 0.25 MG tablet Take one tab po qhs for leg cramps   . sennosides-docusate sodium (SENOKOT-S) 8.6-50 MG tablet Take 1-2 tablets by mouth at bedtime.    . terbinafine (LAMISIL) 1 % cream Apply 1 application topically 2 (two) times daily as needed (toes).     No facility-administered encounter medications on file as of 03/01/2020.    Surgical History: Past Surgical History:  Procedure Laterality Date  . COLONOSCOPY    . cysto    . LAPAROSCOPY    . TOTAL HIP ARTHROPLASTY Right 08/17/2019   Procedure: TOTAL HIP ARTHROPLASTY;  Surgeon: Dereck Leep, MD;  Location: [facility] ORS;  Service: Orthopedics;  Laterality: Right;    Medical History: Past Medical History:  Diagnosis Date  . Anxiety   . Arthritis   . COPD (chronic obstructive pulmonary disease) ([place])   . Dyspnea   . Gastritis   . GERD (gastroesophageal reflux disease)   . Hypertension   .  Osteoporosis     Family History: Family History  Problem Relation Age of Onset  . Hypertension Mother   . Coronary artery disease Father   . Heart disease Father     Social History   Socioeconomic History  . Marital status: Single    Spouse name: Not on file  . Number of children: Not on file  . Years of education: Not on file  . Highest education level: Not on file  Occupational History  . Not on file  Tobacco Use  . Smoking status: Former Smoker    Quit date: 02/20/2001    Years since quitting: 19.0  . Smokeless tobacco: Never Used  . Tobacco comment: quit 17 years ago  Substance and  Sexual Activity  . Alcohol use: Yes    Alcohol/week: 2.0 standard drinks    Types: 2 Glasses of wine per week    Comment: very rarely  . Drug use: Never  . Sexual activity: Not on file  Other Topics Concern  . Not on file  Social History Narrative  . Not on file   Social Determinants of Health   Financial Resource Strain:   . Difficulty of Paying Living Expenses:   Food Insecurity:   . Worried About Charity fundraiser in the Last Year:   . Arboriculturist in the Last Year:   Transportation Needs:   . Film/video editor (Medical):   Marland Kitchen Lack of Transportation (Non-Medical):   Physical Activity:   . Days of Exercise per Week:   . Minutes of Exercise per Session:   Stress:   . Feeling of Stress :   Social Connections:   . Frequency of Communication with Friends and Family:   . Frequency of Social Gatherings with Friends and Family:   . Attends Religious Services:   . Active Member of Clubs or Organizations:   . Attends Archivist Meetings:   Marland Kitchen Marital Status:   Intimate Partner Violence:   . Fear of Current or Ex-Partner:   . Emotionally Abused:   Marland Kitchen Physically Abused:   . Sexually Abused:       Review of Systems  Constitutional: Negative for chills, diaphoresis and fatigue.  HENT: Negative for ear pain, postnasal drip and sinus pressure.   Eyes: Negative for photophobia, discharge, redness, itching and visual disturbance.  Respiratory: Negative for cough, shortness of breath and wheezing.   Cardiovascular: Negative for chest pain, palpitations and leg swelling.  Gastrointestinal: Negative for abdominal pain, constipation, diarrhea, nausea and vomiting.  Genitourinary: Negative for dysuria and flank pain.  Musculoskeletal: Negative for arthralgias, back pain, gait problem and neck pain.  Skin: Negative for color change.  Allergic/Immunologic: Negative for environmental allergies and food allergies.  Neurological: Negative for dizziness and headaches.   Hematological: Does not bruise/bleed easily.  Psychiatric/Behavioral: Negative for agitation, behavioral problems (depression) and hallucinations.    Vital Signs: BP (!) 142/84   Pulse 74   Temp (!) 97.1 F (36.2 C)   Resp 16   Ht 4\' 7"  (1.397 m)   Wt 80 lb 6.4 oz (36.5 kg)   SpO2 95%   BMI 18.69 kg/m    Physical Exam Constitutional:      General: She is not in acute distress.    Appearance: She is well-developed. She is not diaphoretic.  HENT:     Head: Normocephalic and atraumatic.     Right Ear: External ear normal.     Left Ear: External ear normal.  Nose: Nose normal.     Mouth/Throat:     Pharynx: No oropharyngeal exudate.  Eyes:     General: No scleral icterus.       Right eye: No discharge.        Left eye: No discharge.     Conjunctiva/sclera: Conjunctivae normal.     Pupils: Pupils are equal, round, and reactive to light.  Neck:     Thyroid: No thyromegaly.     Vascular: No JVD.     Trachea: No tracheal deviation.  Cardiovascular:     Rate and Rhythm: Normal rate and regular rhythm.     Heart sounds: Normal heart sounds. No murmur. No friction rub. No gallop.   Pulmonary:     Effort: Pulmonary effort is normal. No respiratory distress.     Breath sounds: Normal breath sounds. No stridor. No wheezing or rales.  Chest:     Chest wall: No tenderness.  Abdominal:     General: Bowel sounds are normal. There is no distension.     Palpations: Abdomen is soft. There is no mass.     Tenderness: There is no abdominal tenderness. There is no guarding or rebound.  Musculoskeletal:        General: No tenderness or deformity. Normal range of motion.     Cervical back: Normal range of motion and neck supple.  Lymphadenopathy:     Cervical: No cervical adenopathy.  Skin:    General: Skin is warm and dry.     Coloration: Skin is not pale.     Findings: No erythema or rash.  Neurological:     Mental Status: She is alert.     Cranial Nerves: No cranial nerve  deficit.     Motor: No abnormal muscle tone.     Coordination: Coordination normal.     Deep Tendon Reflexes: Reflexes are normal and symmetric.  Psychiatric:        Behavior: Behavior normal.        Thought Content: Thought content normal.        Judgment: Judgment normal.    Assessment/Plan: 1. Essential hypertension, benign Stable today, continue on current therapy and continue to monitor. Pt is on Hydralazine 25 mg tid, Norvasc 2.5 mg qd and Losartan 50 mg bid, she cannot tolerate  b blocker and has developed hypernatronemia due to diuretics   2. Hypothyroidism, unspecified type Recheck her thyroid levels as her TSH was recently slightly elevated with a normal T4. Pt is on Synthroid 50 mcg qam  - TSH + free T4  3. Hyponatremia Two episodes of hyponatremia in April, possibly due to volume depletion. Last sodium level normal, will repeat to monitor levels. - Basic metabolic panel  General Counseling: akriti reimold understanding of the findings of todays visit and agrees with plan of treatment. I have discussed any further diagnostic evaluation that may be needed or ordered today. We also reviewed her medications today. she has been encouraged to call the office with any questions or concerns that should arise related to todays visit.    Orders Placed This Encounter  Procedures  . Basic metabolic panel  . TSH + free T4    No orders of the defined types were placed in this encounter.   Total time spent30 Minutes Time spent includes review of chart, medications, test results, and follow up plan with the patient.      Dr Lavera Guise Internal medicine

## 2020-03-04 DIAGNOSIS — H40053 Ocular hypertension, bilateral: Secondary | ICD-10-CM | POA: Diagnosis not present

## 2020-03-07 DIAGNOSIS — E039 Hypothyroidism, unspecified: Secondary | ICD-10-CM | POA: Diagnosis not present

## 2020-03-08 LAB — BASIC METABOLIC PANEL
BUN/Creatinine Ratio: 19 (ref 12–28)
BUN: 15 mg/dL (ref 8–27)
CO2: 24 mmol/L (ref 20–29)
Calcium: 9.8 mg/dL (ref 8.7–10.3)
Chloride: 99 mmol/L (ref 96–106)
Creatinine, Ser: 0.8 mg/dL (ref 0.57–1.00)
GFR calc Af Amer: 79 mL/min/{1.73_m2} (ref >59)
GFR calc non Af Amer: 68 mL/min/{1.73_m2} (ref >59)
Glucose: 82 mg/dL (ref 65–99)
Potassium: 4.5 mmol/L (ref 3.5–5.2)
Sodium: 137 mmol/L (ref 134–144)

## 2020-03-08 LAB — TSH+FREE T4
Free T4: 1.42 ng/dL (ref 0.82–1.77)
TSH: 1.42 u[IU]/mL (ref 0.450–4.500)

## 2020-03-11 ENCOUNTER — Telehealth: Payer: Self-pay

## 2020-03-11 NOTE — Telephone Encounter (Signed)
Confirmed and screened for 03-15-20 ov.

## 2020-03-15 ENCOUNTER — Other Ambulatory Visit: Payer: Self-pay

## 2020-03-15 ENCOUNTER — Ambulatory Visit (INDEPENDENT_AMBULATORY_CARE_PROVIDER_SITE_OTHER): Payer: Medicare Other | Admitting: Internal Medicine

## 2020-03-15 ENCOUNTER — Encounter: Payer: Self-pay | Admitting: Internal Medicine

## 2020-03-15 DIAGNOSIS — I10 Essential (primary) hypertension: Secondary | ICD-10-CM | POA: Diagnosis not present

## 2020-03-15 DIAGNOSIS — K219 Gastro-esophageal reflux disease without esophagitis: Secondary | ICD-10-CM

## 2020-03-15 DIAGNOSIS — E039 Hypothyroidism, unspecified: Secondary | ICD-10-CM | POA: Diagnosis not present

## 2020-03-15 DIAGNOSIS — Z0001 Encounter for general adult medical examination with abnormal findings: Secondary | ICD-10-CM

## 2020-03-15 DIAGNOSIS — E871 Hypo-osmolality and hyponatremia: Secondary | ICD-10-CM

## 2020-03-15 MED ORDER — CLONIDINE HCL 0.1 MG PO TABS: 0.1000 mg | ORAL_TABLET | Freq: Two times a day (BID) | ORAL | 11 refills | Status: DC

## 2020-03-15 NOTE — Progress Notes (Signed)
[facility] [place], [place] [address]  Internal MEDICINE  Office Visit Note  Patient Name: Pam Cruz  [id number]  [id number]  Date of Service: 03/15/2020  Chief Complaint  Patient presents with  . Medicare Wellness    BP   . Gastroesophageal Reflux  . Hypertension   HPI Pt is here for routine health maintenance examination - She is feeling well with some bp elevation - Continues to have anxiety and abdominal cramps. - Asthma is well controlled. - Occasional feet swelling due to amlodipine - Recent sodium was normal  - Denies any fever or chills  Current Medication: Outpatient Encounter Medications as of 03/15/2020  Medication Sig Note  . acetaminophen (TYLENOL) 500 MG tablet Take 500 mg by mouth every 6 (six) hours as needed (pain).    Marland Kitchen amLODipine (NORVASC) 2.5 MG tablet Take 1 tablet (2.5 mg total) by mouth daily.   . clonazePAM (KLONOPIN) 0.5 MG tablet TAKE 1 TABLET BY MOUTH  TWICE DAILY AS NEEDED FOR  ANXIETY   . desipramine (NORPRAMIN) 25 MG tablet Take 25 mg by mouth at bedtime.    . fluticasone (FLONASE) 50 MCG/ACT nasal spray USE 2 SPRAYS IN BOTH  NOSTRILS AT NIGHT   . Fluticasone-Salmeterol (WIXELA INHUB) 250-50 MCG/DOSE AEPB Inhale 1 puff into the lungs 2 (two) times daily.   . Homeopathic Products ([place]) FOAM Apply 2 application topically See admin instructions. Apply two applications to various areas of the body at bedtime according to bottle instructions 01/29/2020: Medication at bedside   . hydrALAZINE (APRESOLINE) 25 MG tablet Take 1 tablet (25 mg total) by mouth 3 (three) times daily.   Marland Kitchen levothyroxine (SYNTHROID) 50 MCG tablet Take 1 tablet (50 mcg total) by mouth daily.   Marland Kitchen losartan (COZAAR) 50 MG tablet TAKE 1 TABLET BY MOUTH  TWICE DAILY   . Multiple Vitamins-Minerals (PRESERVISION AREDS 2+MULTI VIT) CAPS Take 1 capsule by mouth in the morning and at bedtime.   Marland Kitchen omeprazole (PRILOSEC) 20 MG capsule Take 20 mg by  mouth 2 (two) times daily before a meal.    . ondansetron (ZOFRAN) 4 MG tablet Take 1 tablet (4 mg total) by mouth every 8 (eight) hours as needed for nausea or vomiting.   . Polyethylene Glycol 400 (BLINK TEARS) 0.25 % GEL Place 1-2 drops into both eyes as needed (dry eyes). 01/29/2020: Medication at bedside  . rOPINIRole (REQUIP) 0.25 MG tablet Take one tab po qhs for leg cramps   . sennosides-docusate sodium (SENOKOT-S) 8.6-50 MG tablet Take 1-2 tablets by mouth at bedtime.    . terbinafine (LAMISIL) 1 % cream Apply 1 application topically 2 (two) times daily as needed (toes).    . cloNIDine (CATAPRES) 0.1 MG tablet Take 1 tablet (0.1 mg total) by mouth 2 (two) times daily. For blood pressure above 170 as needed only    No facility-administered encounter medications on file as of 03/15/2020.    Surgical History: Past Surgical History:  Procedure Laterality Date  . COLONOSCOPY    . cysto    . LAPAROSCOPY    . TOTAL HIP ARTHROPLASTY Right 08/17/2019   Procedure: TOTAL HIP ARTHROPLASTY;  Surgeon: Dereck Leep, MD;  Location: [facility] ORS;  Service: Orthopedics;  Laterality: Right;    Medical History: Past Medical History:  Diagnosis Date  . Anxiety   . Arthritis   . COPD (chronic obstructive pulmonary disease) ([place])   . Dyspnea   . Gastritis   . GERD (gastroesophageal  reflux disease)   . Hypertension   . Osteoporosis     Family History: Family History  Problem Relation Age of Onset  . Hypertension Mother   . Coronary artery disease Father   . Heart disease Father       Review of Systems  Constitutional: Negative for chills, diaphoresis and fatigue.  HENT: Negative for ear pain, postnasal drip and sinus pressure.   Eyes: Negative for photophobia, discharge, redness, itching and visual disturbance.  Respiratory: Negative for cough, shortness of breath and wheezing.   Cardiovascular: Negative for chest pain, palpitations and leg swelling.  Gastrointestinal: Negative for  abdominal pain, constipation, diarrhea, nausea and vomiting.  Genitourinary: Negative for dysuria and flank pain.  Musculoskeletal: Negative for arthralgias, back pain, gait problem and neck pain.  Skin: Negative for color change.  Allergic/Immunologic: Negative for environmental allergies and food allergies.  Neurological: Negative for dizziness and headaches.  Hematological: Does not bruise/bleed easily.  Psychiatric/Behavioral: Negative for agitation, behavioral problems (depression) and hallucinations.     Vital Signs: BP (!) 174/85   Pulse 73   Temp (!) 97.3 F (36.3 C)   Resp 16   Ht '4\' 7"'$  (1.397 m)   Wt 82 lb (37.2 kg)   SpO2 99%   BMI 19.06 kg/m    Physical Exam Constitutional:      General: She is not in acute distress.    Appearance: She is well-developed. She is not diaphoretic.  HENT:     Head: Normocephalic and atraumatic.     Mouth/Throat:     Pharynx: No oropharyngeal exudate.  Eyes:     Pupils: Pupils are equal, round, and reactive to light.  Neck:     Thyroid: No thyromegaly.     Vascular: No JVD.     Trachea: No tracheal deviation.  Cardiovascular:     Rate and Rhythm: Normal rate and regular rhythm.     Heart sounds: Normal heart sounds. No murmur heard.  No friction rub. No gallop.   Pulmonary:     Effort: Pulmonary effort is normal. No respiratory distress.     Breath sounds: No wheezing or rales.  Chest:     Chest wall: No tenderness.     Breasts:        Right: Normal.        Left: Normal. No swelling.  Abdominal:     General: Bowel sounds are normal.     Palpations: Abdomen is soft.  Musculoskeletal:        General: Normal range of motion.     Cervical back: Normal range of motion and neck supple.  Lymphadenopathy:     Cervical: No cervical adenopathy.  Skin:    General: Skin is warm and dry.  Neurological:     Mental Status: She is alert and oriented to person, place, and time.     Cranial Nerves: No cranial nerve deficit.   Psychiatric:        Behavior: Behavior normal.        Thought Content: Thought content normal.        Judgment: Judgment normal.      LABS: Recent Results (from the past 2160 hour(s))  CBC     Status: None   Collection Time: 01/19/20  3:45 PM  Result Value Ref Range   WBC 7.3 4.0 - 10.5 K/uL   RBC 4.44 3.87 - 5.11 MIL/uL   Hemoglobin 12.7 12.0 - 15.0 g/dL   HCT 38.4 36.0 - 46.0 %  MCV 86.5 80.0 - 100.0 fL   MCH 28.6 26.0 - 34.0 pg   MCHC 33.1 30.0 - 36.0 g/dL   RDW 14.5 11.5 - 15.5 %   Platelets 320 150 - 400 K/uL   nRBC 0.0 0.0 - 0.2 %    Comment: Performed at [facility], [address] [place], [place] [address]  Comprehensive metabolic panel     Status: Abnormal   Collection Time: 01/19/20  3:45 PM  Result Value Ref Range   Sodium 125 (L) 135 - 145 mmol/L   Potassium 4.1 3.5 - 5.1 mmol/L   Chloride 89 (L) 98 - 111 mmol/L   CO2 24 22 - 32 mmol/L   Glucose, Bld 135 (H) 70 - 99 mg/dL    Comment: Glucose reference range applies only to samples taken after fasting for at least 8 hours.   BUN 14 8 - 23 mg/dL   Creatinine, Ser 0.56 0.44 - 1.00 mg/dL   Calcium 9.7 8.9 - 10.3 mg/dL   Total Protein 7.3 6.5 - 8.1 g/dL   Albumin 4.6 3.5 - 5.0 g/dL   AST 33 15 - 41 U/L   ALT 20 0 - 44 U/L   Alkaline Phosphatase 93 38 - 126 U/L   Total Bilirubin 1.0 0.3 - 1.2 mg/dL   GFR calc non Af Amer >60 >60 mL/min   GFR calc Af Amer >60 >60 mL/min   Anion gap 12 5 - 15    Comment: Performed at [facility], [address] [place], [place] [address]  Lipase, blood     Status: None   Collection Time: 01/19/20  3:45 PM  Result Value Ref Range   Lipase 30 11 - 51 U/L    Comment: Performed at [facility], [place], [place] [address]  Troponin I (High Sensitivity)     Status: None   Collection Time: 01/19/20  3:45 PM  Result Value Ref Range   Troponin I (High Sensitivity) 8 <18 ng/L    Comment: (NOTE) Elevated high sensitivity troponin I  (hsTnI) values and significant  changes across serial measurements may suggest ACS but many other  chronic and acute conditions are known to elevate hsTnI results.  Refer to the "Links" section for chest pain algorithms and additional  guidance. Performed at [facility], [address] [place], [place] [address]   Magnesium     Status: None   Collection Time: 01/19/20  3:45 PM  Result Value Ref Range   Magnesium 2.0 1.7 - 2.4 mg/dL    Comment: Performed at [facility], [address] [place], [place] [address]  Phosphorus     Status: None   Collection Time: 01/19/20  3:45 PM  Result Value Ref Range   Phosphorus 2.9 2.5 - 4.6 mg/dL    Comment: Performed at [facility], [address] [place], [place] [address]  Urinalysis, Complete w Microscopic     Status: Abnormal   Collection Time: 01/19/20  4:52 PM  Result Value Ref Range   Color, Urine STRAW (A) YELLOW   APPearance CLEAR (A) CLEAR   Specific Gravity, Urine 1.010 1.005 - 1.030   pH 9.0 (H) 5.0 - 8.0   Glucose, UA NEGATIVE NEGATIVE mg/dL   Hgb urine dipstick NEGATIVE NEGATIVE   Bilirubin Urine NEGATIVE NEGATIVE   Ketones, ur 5 (A) NEGATIVE mg/dL   Protein, ur 100 (A) NEGATIVE mg/dL   Nitrite NEGATIVE NEGATIVE   Leukocytes,Ua NEGATIVE NEGATIVE  RBC / HPF 0-5 0 - 5 RBC/hpf   WBC, UA 0-5 0 - 5 WBC/hpf   Bacteria, UA NONE SEEN NONE SEEN   Squamous Epithelial / LPF 0-5 0 - 5   Mucus PRESENT     Comment: Performed at [facility], [address], [place], [place] [address]  Troponin I (High Sensitivity)     Status: None   Collection Time: 01/19/20  5:55 PM  Result Value Ref Range   Troponin I (High Sensitivity) 9 <18 ng/L    Comment: (NOTE) Elevated high sensitivity troponin I (hsTnI) values and significant  changes across serial measurements may suggest ACS but many other  chronic and acute conditions are known to elevate hsTnI results.  Refer to the "Links" section for chest  pain algorithms and additional  guidance. Performed at [facility], [place], [place] [address]   SARS CORONAVIRUS 2 (TAT 6-24 HRS) Nasopharyngeal Nasopharyngeal Swab     Status: None   Collection Time: 01/19/20  8:03 PM   Specimen: Nasopharyngeal Swab  Result Value Ref Range   SARS Coronavirus 2 NEGATIVE NEGATIVE    Comment: (NOTE) SARS-CoV-2 target nucleic acids are NOT DETECTED. The SARS-CoV-2 RNA is generally detectable in upper and lower respiratory specimens during the acute phase of infection. Negative results do not preclude SARS-CoV-2 infection, do not rule out co-infections with other pathogens, and should not be used as the sole basis for treatment or other patient management decisions. Negative results must be combined with clinical observations, patient history, and epidemiological information. The expected result is Negative. Fact Sheet for Patients: SugarRoll.be Fact Sheet for Healthcare Providers: https://www.woods-mathews.com/ This test is not yet approved or cleared by the [place] FDA and  has been authorized for detection and/or diagnosis of SARS-CoV-2 by FDA under an Emergency Use Authorization (EUA). This EUA will remain  in effect (meaning this test can be used) for the duration of the COVID-19 declaration under Section 56 4(b)(1) of the Act, 21 U.S.C. section 360bbb-3(b)(1), unless the authorization is terminated or revoked sooner. Performed at [facility], [place] [address] [place], [place] [address]   Osmolality, urine     Status: Abnormal   Collection Time: 01/19/20  8:51 PM  Result Value Ref Range   Osmolality, Ur 271 (L) 300 - 900 mOsm/kg    Comment: Performed at [facility], [address] [place], [place] [address]  Osmolality     Status: Abnormal   Collection Time: 01/19/20  8:51 PM  Result Value Ref Range   Osmolality 264 (L) 275 - 295 mOsm/kg    Comment:  Performed at [facility], [address] [place], [place] [address]  Sodium, urine, random     Status: None   Collection Time: 01/19/20  8:51 PM  Result Value Ref Range   Sodium, Ur 109 mmol/L    Comment: Performed at [facility], [address] [place], [place] [address]  CBC     Status: Abnormal   Collection Time: 01/20/20  5:15 AM  Result Value Ref Range   WBC 5.6 4.0 - 10.5 K/uL   RBC 4.25 3.87 - 5.11 MIL/uL   Hemoglobin 12.0 12.0 - 15.0 g/dL   HCT 35.8 (L) 36.0 - 46.0 %   MCV 84.2 80.0 - 100.0 fL   MCH 28.2 26.0 - 34.0 pg   MCHC 33.5 30.0 - 36.0 g/dL   RDW 14.5 11.5 - 15.5 %   Platelets 313 150 -  400 K/uL   nRBC 0.0 0.0 - 0.2 %    Comment: Performed at [facility], [address] [place], [place] [address]  Basic metabolic panel     Status: Abnormal   Collection Time: 01/20/20  5:15 AM  Result Value Ref Range   Sodium 128 (L) 135 - 145 mmol/L   Potassium 3.8 3.5 - 5.1 mmol/L   Chloride 99 98 - 111 mmol/L   CO2 22 22 - 32 mmol/L   Glucose, Bld 86 70 - 99 mg/dL    Comment: Glucose reference range applies only to samples taken after fasting for at least 8 hours.   BUN 9 8 - 23 mg/dL   Creatinine, Ser 0.64 0.44 - 1.00 mg/dL   Calcium 8.8 (L) 8.9 - 10.3 mg/dL   GFR calc non Af Amer >60 >60 mL/min   GFR calc Af Amer >60 >60 mL/min   Anion gap 7 5 - 15    Comment: Performed at [facility], [address] [place], [place] [address]  Basic metabolic panel     Status: Abnormal   Collection Time: 01/20/20 11:21 AM  Result Value Ref Range   Sodium 128 (L) 135 - 145 mmol/L   Potassium 3.7 3.5 - 5.1 mmol/L   Chloride 99 98 - 111 mmol/L   CO2 22 22 - 32 mmol/L   Glucose, Bld 92 70 - 99 mg/dL    Comment: Glucose reference range applies only to samples taken after fasting for at least 8 hours.   BUN 12 8 - 23 mg/dL   Creatinine, Ser 0.81 0.44 - 1.00 mg/dL   Calcium 8.7 (L) 8.9 - 10.3 mg/dL   GFR calc non Af Amer >60 >60 mL/min   GFR  calc Af Amer >60 >60 mL/min   Anion gap 7 5 - 15    Comment: Performed at [facility], [address] [place], [place] [address]  Basic metabolic panel     Status: Abnormal   Collection Time: 01/28/20  3:00 PM  Result Value Ref Range   Glucose 105 (H) 65 - 99 mg/dL   BUN 17 8 - 27 mg/dL   Creatinine, Ser 0.80 0.57 - 1.00 mg/dL   GFR calc non Af Amer 69 >59 mL/min/1.73   GFR calc Af Amer 79 >59 mL/min/1.73   BUN/Creatinine Ratio 21 12 - 28   Sodium 123 (L) 134 - 144 mmol/L   Potassium 4.4 3.5 - 5.2 mmol/L   Chloride 83 (L) 96 - 106 mmol/L   CO2 23 20 - 29 mmol/L   Calcium 10.2 8.7 - 10.3 mg/dL  Magnesium     Status: None   Collection Time: 01/28/20  3:00 PM  Result Value Ref Range   Magnesium 2.0 1.6 - 2.3 mg/dL  B12 and Folate Panel     Status: None   Collection Time: 01/28/20  3:00 PM  Result Value Ref Range   Vitamin B-12 402 232 - 1,245 pg/mL   Folate 15.9 >3.0 ng/mL    Comment: A serum folate concentration of less than 3.1 ng/mL is considered to represent clinical deficiency.   Fe+TIBC+Fer     Status: Abnormal   Collection Time: 01/28/20  3:00 PM  Result Value Ref Range   Total Iron Binding Capacity 399 250 - 450 ug/dL   UIBC 253 118 - 369 ug/dL   Iron 146 (H) 27 - 139 ug/dL   Iron Saturation 37 15 - 55 %   Ferritin 46  15 - 150 ng/mL  Respiratory Panel by RT PCR (Flu A&B, Covid) - Nasopharyngeal Swab     Status: None   Collection Time: 01/29/20  3:18 PM   Specimen: Nasopharyngeal Swab  Result Value Ref Range   SARS Coronavirus 2 by RT PCR NEGATIVE NEGATIVE    Comment: (NOTE) SARS-CoV-2 target nucleic acids are NOT DETECTED. The SARS-CoV-2 RNA is generally detectable in upper respiratoy specimens during the acute phase of infection. The lowest concentration of SARS-CoV-2 viral copies this assay can detect is 131 copies/mL. A negative result does not preclude SARS-Cov-2 infection and should not be used as the sole basis for treatment or other patient  management decisions. A negative result may occur with  improper specimen collection/handling, submission of specimen other than nasopharyngeal swab, presence of viral mutation(s) within the areas targeted by this assay, and inadequate number of viral copies (<131 copies/mL). A negative result must be combined with clinical observations, patient history, and epidemiological information. The expected result is Negative. Fact Sheet for Patients:  PinkCheek.be Fact Sheet for Healthcare Providers:  GravelBags.it This test is not yet ap proved or cleared by the [place] FDA and  has been authorized for detection and/or diagnosis of SARS-CoV-2 by FDA under an Emergency Use Authorization (EUA). This EUA will remain  in effect (meaning this test can be used) for the duration of the COVID-19 declaration under Section 564(b)(1) of the Act, 21 U.S.C. section 360bbb-3(b)(1), unless the authorization is terminated or revoked sooner.    Influenza A by PCR NEGATIVE NEGATIVE   Influenza B by PCR NEGATIVE NEGATIVE    Comment: (NOTE) The Xpert Xpress SARS-CoV-2/FLU/RSV assay is intended as an aid in  the diagnosis of influenza from Nasopharyngeal swab specimens and  should not be used as a sole basis for treatment. Nasal washings and  aspirates are unacceptable for Xpert Xpress SARS-CoV-2/FLU/RSV  testing. Fact Sheet for Patients: PinkCheek.be Fact Sheet for Healthcare Providers: GravelBags.it This test is not yet approved or cleared by the [place] FDA and  has been authorized for detection and/or diagnosis of SARS-CoV-2 by  FDA under an Emergency Use Authorization (EUA). This EUA will remain  in effect (meaning this test can be used) for the duration of the  Covid-19 declaration under Section 564(b)(1) of the Act, 21  U.S.C. section 360bbb-3(b)(1), unless the authorization  is  terminated or revoked. Performed at [facility], [address] [place], [place] [address]   CBC     Status: None   Collection Time: 01/29/20  3:58 PM  Result Value Ref Range   WBC 7.7 4.0 - 10.5 K/uL   RBC 4.35 3.87 - 5.11 MIL/uL   Hemoglobin 12.5 12.0 - 15.0 g/dL   HCT 37.0 36.0 - 46.0 %   MCV 85.1 80.0 - 100.0 fL   MCH 28.7 26.0 - 34.0 pg   MCHC 33.8 30.0 - 36.0 g/dL   RDW 14.9 11.5 - 15.5 %   Platelets 349 150 - 400 K/uL   nRBC 0.0 0.0 - 0.2 %    Comment: Performed at [facility], [address], [place], [place] [address]  Comprehensive metabolic panel     Status: Abnormal   Collection Time: 01/29/20  3:58 PM  Result Value Ref Range   Sodium 128 (L) 135 - 145 mmol/L   Potassium 4.2 3.5 - 5.1 mmol/L   Chloride 94 (L) 98 - 111 mmol/L   CO2 22 22 - 32 mmol/L   Glucose, Bld 89  70 - 99 mg/dL    Comment: Glucose reference range applies only to samples taken after fasting for at least 8 hours.   BUN 23 8 - 23 mg/dL   Creatinine, Ser 1.07 (H) 0.44 - 1.00 mg/dL   Calcium 10.1 8.9 - 10.3 mg/dL   Total Protein 7.0 6.5 - 8.1 g/dL   Albumin 4.8 3.5 - 5.0 g/dL   AST 29 15 - 41 U/L   ALT 21 0 - 44 U/L   Alkaline Phosphatase 80 38 - 126 U/L   Total Bilirubin 0.8 0.3 - 1.2 mg/dL   GFR calc non Af Amer 48 (L) >60 mL/min   GFR calc Af Amer 56 (L) >60 mL/min   Anion gap 12 5 - 15    Comment: Performed at [facility], [address] [place], [place] [address]  Culture, blood (Routine X 2) w Reflex to ID Panel     Status: None   Collection Time: 01/29/20  3:58 PM   Specimen: BLOOD  Result Value Ref Range   Specimen Description BLOOD BLOOD RIGHT HAND    Special Requests      BOTTLES DRAWN AEROBIC AND ANAEROBIC Blood Culture adequate volume   Culture      NO GROWTH 5 DAYS Performed at [facility], [address] [place], [place] [address]    Report Status 02/03/2020 FINAL   Culture, blood (Routine X 2) w Reflex to ID Panel      Status: None   Collection Time: 01/29/20  3:58 PM   Specimen: BLOOD  Result Value Ref Range   Specimen Description BLOOD RIGHT ANTECUBITAL    Special Requests      BOTTLES DRAWN AEROBIC AND ANAEROBIC Blood Culture adequate volume   Culture      NO GROWTH 5 DAYS Performed at [facility], [address] [place], [place] [address]    Report Status 02/03/2020 FINAL   Urinalysis, Complete w Microscopic     Status: Abnormal   Collection Time: 01/29/20  5:53 PM  Result Value Ref Range   Color, Urine YELLOW (A) YELLOW   APPearance HAZY (A) CLEAR   Specific Gravity, Urine 1.008 1.005 - 1.030   pH 5.0 5.0 - 8.0   Glucose, UA NEGATIVE NEGATIVE mg/dL   Hgb urine dipstick NEGATIVE NEGATIVE   Bilirubin Urine NEGATIVE NEGATIVE   Ketones, ur NEGATIVE NEGATIVE mg/dL   Protein, ur 30 (A) NEGATIVE mg/dL   Nitrite NEGATIVE NEGATIVE   Leukocytes,Ua NEGATIVE NEGATIVE   RBC / HPF 0-5 0 - 5 RBC/hpf   WBC, UA 0-5 0 - 5 WBC/hpf   Bacteria, UA RARE (A) NONE SEEN   Squamous Epithelial / LPF 0-5 0 - 5   Mucus PRESENT    Hyaline Casts, UA PRESENT     Comment: Performed at [facility], [address] [place], [place] [address]  Urine Culture     Status: Abnormal   Collection Time: 01/29/20  5:53 PM   Specimen: Urine, Random  Result Value Ref Range   Specimen Description      URINE, RANDOM Performed at [facility], [address], [place], [place] [address]    Special Requests      please do I and O cath if patient does not void by 4 PM Performed at [facility], [address] [place],  [address]    Culture (A)     <10,000 COLONIES/mL INSIGNIFICANT GROWTH Performed at [facility], [address] [place], [place] [address]    Report Status 01/30/2020 FINAL   Sodium, urine, random     Status: None   Collection Time: 01/29/20  5:53 PM  Result Value Ref Range   Sodium, Ur <10 mmol/L    Comment: Performed at [facility], [address] [place], [place] [address]  Osmolality, urine     Status: Abnormal   Collection Time: 01/29/20  5:53 PM  Result Value Ref Range   Osmolality, Ur 252 (L) 300 - 900 mOsm/kg    Comment: Performed at [facility], [address] [place], [place] [address]  Osmolality     Status: None   Collection Time: 01/29/20  8:45 PM  Result Value Ref Range   Osmolality 278 275 - 295 mOsm/kg    Comment: Performed at [facility], [address] [place], [place] [address]  Basic metabolic panel     Status: Abnormal   Collection Time: 01/30/20  3:47 AM  Result Value Ref Range   Sodium 132 (L) 135 - 145 mmol/L   Potassium 4.1 3.5 - 5.1 mmol/L   Chloride 101 98 - 111 mmol/L   CO2 24 22 - 32 mmol/L   Glucose, Bld 82 70 - 99 mg/dL    Comment: Glucose reference range applies only to samples taken after fasting for at least 8 hours.   BUN 19 8 - 23 mg/dL   Creatinine, Ser 0.75 0.44 - 1.00 mg/dL   Calcium 9.0 8.9 - 10.3 mg/dL   GFR calc non Af Amer >60 >60 mL/min   GFR calc Af Amer >60 >60 mL/min   Anion gap 7 5 - 15    Comment: Performed at [facility], [address] [place], [place] [address]  CBC     Status: Abnormal   Collection Time: 01/30/20  3:47 AM  Result Value Ref Range   WBC 4.2 4.0 - 10.5 K/uL   RBC 3.74 (L) 3.87 - 5.11 MIL/uL   Hemoglobin 10.8 (L) 12.0 - 15.0 g/dL   HCT 31.8 (L) 36.0 - 46.0 %   MCV 85.0 80.0 - 100.0 fL   MCH 28.9 26.0 - 34.0 pg   MCHC 34.0 30.0 - 36.0 g/dL   RDW 14.9 11.5 - 15.5 %   Platelets 290 150 - 400 K/uL   nRBC 0.0 0.0 - 0.2 %    Comment: Performed at [facility], [address] [place], [place] [address]  T4, free     Status: None   Collection Time: 01/30/20  3:47 AM  Result Value Ref Range   Free T4 0.90 0.61 - 1.12 ng/dL    Comment: (NOTE) Biotin ingestion may interfere with free T4 tests. If the results are inconsistent with the TSH level, previous test results, or the clinical presentation, then  consider biotin interference. If needed, order repeat testing after stopping biotin. Performed at [facility], [address] [place], [place] [address]   TSH     Status: Abnormal   Collection Time: 01/30/20  3:47 AM  Result Value Ref Range   TSH 5.179 (H) 0.350 - 4.500 uIU/mL    Comment: Performed by a 3rd Generation assay with a functional sensitivity of <=0.01 uIU/mL. Performed at [facility], [address] [place], [place] [address]   Basic metabolic panel     Status: Abnormal   Collection Time: 01/30/20 12:01 PM  Result Value Ref Range   Sodium 134 (L) 135 -  145 mmol/L   Potassium 3.9 3.5 - 5.1 mmol/L   Chloride 103 98 - 111 mmol/L   CO2 24 22 - 32 mmol/L   Glucose, Bld 91 70 - 99 mg/dL    Comment: Glucose reference range applies only to samples taken after fasting for at least 8 hours.   BUN 16 8 - 23 mg/dL   Creatinine, Ser 0.71 0.44 - 1.00 mg/dL   Calcium 9.0 8.9 - 10.3 mg/dL   GFR calc non Af Amer >60 >60 mL/min   GFR calc Af Amer >60 >60 mL/min   Anion gap 7 5 - 15    Comment: Performed at [facility], [address], [place], [place] [address]  Basic metabolic panel     Status: None   Collection Time: 02/08/20  2:16 PM  Result Value Ref Range   Glucose 82 65 - 99 mg/dL   BUN 15 8 - 27 mg/dL   Creatinine, Ser 0.75 0.57 - 1.00 mg/dL   GFR calc non Af Amer 74 >59 mL/min/1.73   GFR calc Af Amer 86 >59 mL/min/1.73    Comment: **[facility] currently reports eGFR in compliance with the current**   recommendations of the [employer]. [facility] will   update reporting as new guidelines are published from the NKF-ASN   Task force.    BUN/Creatinine Ratio 20 12 - 28   Sodium 138 134 - 144 mmol/L   Potassium 4.4 3.5 - 5.2 mmol/L   Chloride 100 96 - 106 mmol/L   CO2 25 20 - 29 mmol/L   Calcium 9.8 8.7 - 10.3 mg/dL  TSH + free T4     Status: None   Collection Time: 03/07/20  4:30 PM  Result Value Ref Range   TSH 1.420 0.450  - 4.500 uIU/mL   Free T4 1.42 0.82 - 1.77 ng/dL  Basic metabolic panel     Status: None   Collection Time: 03/07/20  4:30 PM  Result Value Ref Range   Glucose 82 65 - 99 mg/dL   BUN 15 8 - 27 mg/dL   Creatinine, Ser 0.80 0.57 - 1.00 mg/dL   GFR calc non Af Amer 68 >59 mL/min/1.73   GFR calc Af Amer 79 >59 mL/min/1.73    Comment: **[facility] currently reports eGFR in compliance with the current**   recommendations of the [employer]. [facility] will   update reporting as new guidelines are published from the NKF-ASN   Task force.    BUN/Creatinine Ratio 19 12 - 28   Sodium 137 134 - 144 mmol/L   Potassium 4.5 3.5 - 5.2 mmol/L   Chloride 99 96 - 106 mmol/L   CO2 24 20 - 29 mmol/L   Calcium 9.8 8.7 - 10.3 mg/dL   Assessment/Plan: 1. Encounter for general adult medical examination with abnormal findings - Updated labs, phm, mammogram, bmd   2. Benign hypertension - Pt has episode of elevated bp, will try Clonidine 0.1 mg bid prn only. Continue all meds as before - cloNIDine (CATAPRES) 0.1 MG tablet; Take 1 tablet (0.1 mg total) by mouth 2 (two) times daily. For blood pressure above 170 as needed only  Dispense: 30 tablet; Refill: 11  3. Hypothyroidism, unspecified type - Continue Synthroid as before, will adjust to lower dose ( half tab a day)  4. Hyponatremia - Stable for now, will recheck  - Basic Metabolic Panel (BMET)  5. Gastroesophageal reflux disease without esophagitis - Continue meds as   General Counseling:  Paola verbalizes understanding of the findings of todays visit and agrees with plan of treatment. I have discussed any further diagnostic evaluation that may be needed or ordered today. We also reviewed her medications today. she has been encouraged to call the office with any questions or concerns that should arise related to todays visit.  Counseling: Hypertension Counseling:   The following hypertensive lifestyle modification were recommended and  discussed:  1. Limiting alcohol intake to less than 1 oz/day of ethanol:(24 oz of beer or 8 oz of wine or 2 oz of 100-proof whiskey). 2. Take baby ASA 81 mg daily. 3. Importance of regular aerobic exercise and losing weight. 4. Reduce dietary saturated fat and cholesterol intake for overall cardiovascular health. 5. Maintaining adequate dietary potassium, calcium, and magnesium intake. 6. Regular monitoring of the blood pressure. 7. Reduce sodium intake to less than 100 mmol/day (less than 2.3 gm of sodium or less than 6 gm of sodium choride)    Orders Placed This Encounter  Procedures  . Basic Metabolic Panel (BMET)    Meds ordered this encounter  Medications  . cloNIDine (CATAPRES) 0.1 MG tablet    Sig: Take 1 tablet (0.1 mg total) by mouth 2 (two) times daily. For blood pressure above 170 as needed only    Dispense:  30 tablet    Refill:  11    Total time spent:40 Minutes  Time spent includes review of chart, medications, test results, and follow up plan with the patient.   Lavera Guise, MD  Internal Medicine

## 2020-03-23 ENCOUNTER — Other Ambulatory Visit: Payer: Self-pay | Admitting: Internal Medicine

## 2020-04-12 DIAGNOSIS — H35051 Retinal neovascularization, unspecified, right eye: Secondary | ICD-10-CM | POA: Diagnosis not present

## 2020-04-13 DIAGNOSIS — S0501XA Injury of conjunctiva and corneal abrasion without foreign body, right eye, initial encounter: Secondary | ICD-10-CM | POA: Diagnosis not present

## 2020-04-19 DIAGNOSIS — S0501XD Injury of conjunctiva and corneal abrasion without foreign body, right eye, subsequent encounter: Secondary | ICD-10-CM | POA: Diagnosis not present

## 2020-05-30 DIAGNOSIS — B351 Tinea unguium: Secondary | ICD-10-CM | POA: Diagnosis not present

## 2020-05-30 DIAGNOSIS — M79674 Pain in right toe(s): Secondary | ICD-10-CM | POA: Diagnosis not present

## 2020-05-30 DIAGNOSIS — M79675 Pain in left toe(s): Secondary | ICD-10-CM | POA: Diagnosis not present

## 2020-05-31 DIAGNOSIS — H353211 Exudative age-related macular degeneration, right eye, with active choroidal neovascularization: Secondary | ICD-10-CM | POA: Diagnosis not present

## 2020-07-05 DIAGNOSIS — H353211 Exudative age-related macular degeneration, right eye, with active choroidal neovascularization: Secondary | ICD-10-CM | POA: Diagnosis not present

## 2020-07-19 ENCOUNTER — Encounter: Payer: Self-pay | Admitting: Internal Medicine

## 2020-07-19 ENCOUNTER — Ambulatory Visit (INDEPENDENT_AMBULATORY_CARE_PROVIDER_SITE_OTHER): Payer: Medicare Other | Admitting: Internal Medicine

## 2020-07-19 ENCOUNTER — Other Ambulatory Visit: Payer: Self-pay

## 2020-07-19 VITALS — BP 144/84 | HR 77 | Temp 97.8°F | Resp 16 | Ht <= 58 in | Wt 81.2 lb

## 2020-07-19 DIAGNOSIS — I10 Essential (primary) hypertension: Secondary | ICD-10-CM

## 2020-07-19 DIAGNOSIS — K588 Other irritable bowel syndrome: Secondary | ICD-10-CM

## 2020-07-19 DIAGNOSIS — G2581 Restless legs syndrome: Secondary | ICD-10-CM

## 2020-07-19 DIAGNOSIS — F411 Generalized anxiety disorder: Secondary | ICD-10-CM | POA: Diagnosis not present

## 2020-07-19 DIAGNOSIS — M159 Polyosteoarthritis, unspecified: Secondary | ICD-10-CM | POA: Diagnosis not present

## 2020-07-19 NOTE — Progress Notes (Signed)
[facility] [place], [place] [address]  Internal MEDICINE  Office Visit Note  Patient Name: Pam Cruz  [id number]  [id number]  Date of Service: 07/22/2020  Chief Complaint  Patient presents with  . Follow-up    can pt get flu shot earlier than 1 year  . Hypertension  . Quality Metric Gaps    flu shot, TDAP    HPI Pt is here for routine follow up, she is worried that she has gained few lbs because she has been eating junk, does not want to cook for herself, gets lazy, does not want meals on wheels. Also  Stressed about her vision, pt has ocular HTN and is being seen by ophthalmology on regular basis, cannot drive long distance, her cousion has been helping her,, she has seen podiatry for ongoing mycotic toe ail  infection    BP is elevated today but repeat is improved Pt has great deal of anxiety, has been on low dose Klonopin to keep her symptoms under control. Current Medication: pdmp reviwed  Outpatient Encounter Medications as of 07/19/2020  Medication Sig Note  . acetaminophen (TYLENOL) 500 MG tablet Take 500 mg by mouth every 6 (six) hours as needed (pain).    Marland Kitchen amLODipine (NORVASC) 2.5 MG tablet Take 1 tablet (2.5 mg total) by mouth daily.   . clonazePAM (KLONOPIN) 0.5 MG tablet TAKE 1 TABLET BY MOUTH  TWICE DAILY AS NEEDED FOR  ANXIETY   . cloNIDine (CATAPRES) 0.1 MG tablet Take 1 tablet (0.1 mg total) by mouth 2 (two) times daily. For blood pressure above 170 as needed only   . desipramine (NORPRAMIN) 25 MG tablet TAKE 1 TABLET BY MOUTH  DAILY   . fluticasone (FLONASE) 50 MCG/ACT nasal spray USE 2 SPRAYS IN BOTH  NOSTRILS AT NIGHT   . Fluticasone-Salmeterol (WIXELA INHUB) 250-50 MCG/DOSE AEPB Inhale 1 puff into the lungs 2 (two) times daily.   . Homeopathic Products ([place]) FOAM Apply 2 application topically See admin instructions. Apply two applications to various areas of the body at bedtime according to bottle instructions  01/29/2020: Medication at bedside   . hydrALAZINE (APRESOLINE) 25 MG tablet Take 1 tablet (25 mg total) by mouth 3 (three) times daily.   Marland Kitchen levothyroxine (SYNTHROID) 50 MCG tablet Take 1 tablet (50 mcg total) by mouth daily.   Marland Kitchen losartan (COZAAR) 50 MG tablet TAKE 1 TABLET BY MOUTH  TWICE DAILY   . Multiple Vitamins-Minerals (PRESERVISION AREDS 2+MULTI VIT) CAPS Take 1 capsule by mouth in the morning and at bedtime.   Marland Kitchen omeprazole (PRILOSEC) 20 MG capsule Take 20 mg by mouth 2 (two) times daily before a meal.    . Polyethylene Glycol 400 (BLINK TEARS) 0.25 % GEL Place 1-2 drops into both eyes as needed (dry eyes). 01/29/2020: Medication at bedside  . rOPINIRole (REQUIP) 0.25 MG tablet Take one tab po qhs for leg cramps   . sennosides-docusate sodium (SENOKOT-S) 8.6-50 MG tablet Take 1-2 tablets by mouth at bedtime.    . terbinafine (LAMISIL) 1 % cream Apply 1 application topically 2 (two) times daily as needed (toes).    . ondansetron (ZOFRAN) 4 MG tablet Take 1 tablet (4 mg total) by mouth every 8 (eight) hours as needed for nausea or vomiting. (Patient not taking: Reported on 07/19/2020)    No facility-administered encounter medications on file as of 07/19/2020.    Surgical History: Past Surgical History:  Procedure Laterality Date  . COLONOSCOPY    .  cysto    . LAPAROSCOPY    . TOTAL HIP ARTHROPLASTY Right 08/17/2019   Procedure: TOTAL HIP ARTHROPLASTY;  Surgeon: Dereck Leep, MD;  Location: [facility] ORS;  Service: Orthopedics;  Laterality: Right;    Medical History: Past Medical History:  Diagnosis Date  . Anxiety   . Arthritis   . COPD (chronic obstructive pulmonary disease) ([place])   . Dyspnea   . Gastritis   . GERD (gastroesophageal reflux disease)   . Hypertension   . Osteoporosis     Family History: Family History  Problem Relation Age of Onset  . Hypertension Mother   . Coronary artery disease Father   . Heart disease Father     Social History   Socioeconomic History   . Marital status: Single    Spouse name: Not on file  . Number of children: Not on file  . Years of education: Not on file  . Highest education level: Not on file  Occupational History  . Not on file  Tobacco Use  . Smoking status: Former Smoker    Quit date: 02/20/2001    Years since quitting: 19.4  . Smokeless tobacco: Never Used  . Tobacco comment: quit 17 years ago  Vaping Use  . Vaping Use: Never used  Substance and Sexual Activity  . Alcohol use: Yes    Alcohol/week: 2.0 standard drinks    Types: 2 Glasses of wine per week    Comment: very rarely  . Drug use: Never  . Sexual activity: Not on file  Other Topics Concern  . Not on file  Social History Narrative  . Not on file   Social Determinants of Health   Financial Resource Strain:   . Difficulty of Paying Living Expenses: Not on file  Food Insecurity:   . Worried About Charity fundraiser in the Last Year: Not on file  . Ran Out of Food in the Last Year: Not on file  Transportation Needs:   . Lack of Transportation (Medical): Not on file  . Lack of Transportation (Non-Medical): Not on file  Physical Activity:   . Days of Exercise per Week: Not on file  . Minutes of Exercise per Session: Not on file  Stress:   . Feeling of Stress : Not on file  Social Connections:   . Frequency of Communication with Friends and Family: Not on file  . Frequency of Social Gatherings with Friends and Family: Not on file  . Attends Religious Services: Not on file  . Active Member of Clubs or Organizations: Not on file  . Attends Archivist Meetings: Not on file  . Marital Status: Not on file  Intimate Partner Violence:   . Fear of Current or Ex-Partner: Not on file  . Emotionally Abused: Not on file  . Physically Abused: Not on file  . Sexually Abused: Not on file      Review of Systems  Constitutional: Negative for chills, diaphoresis and fatigue.  HENT: Negative for ear pain, postnasal drip and sinus  pressure.   Eyes: Negative for photophobia, discharge, redness, itching and visual disturbance.  Respiratory: Negative for cough, shortness of breath and wheezing.   Cardiovascular: Negative for chest pain, palpitations and leg swelling.  Gastrointestinal: Negative for abdominal pain, constipation, diarrhea, nausea and vomiting.  Genitourinary: Negative for dysuria and flank pain.  Musculoskeletal: Negative for arthralgias, back pain, gait problem and neck pain.  Skin: Negative for color change.  Allergic/Immunologic: Negative for environmental allergies and  food allergies.  Neurological: Negative for dizziness and headaches.  Hematological: Does not bruise/bleed easily.  Psychiatric/Behavioral: Negative for agitation, behavioral problems (depression) and hallucinations.    Vital Signs: BP (!) 144/84   Pulse 77   Temp 97.8 F (36.6 C)   Resp 16   Ht 4\' 8"  (1.422 m)   Wt 81 lb 3.2 oz (36.8 kg)   SpO2 99%   BMI 18.20 kg/m    Physical Exam Constitutional:      General: She is not in acute distress.    Appearance: She is well-developed. She is not diaphoretic.  HENT:     Head: Normocephalic and atraumatic.     Mouth/Throat:     Pharynx: No oropharyngeal exudate.  Eyes:     Pupils: Pupils are equal, round, and reactive to light.  Neck:     Thyroid: No thyromegaly.     Vascular: No JVD.     Trachea: No tracheal deviation.  Cardiovascular:     Rate and Rhythm: Normal rate and regular rhythm.     Heart sounds: Normal heart sounds. No murmur heard.  No friction rub. No gallop.   Pulmonary:     Effort: Pulmonary effort is normal. No respiratory distress.     Breath sounds: No wheezing or rales.  Chest:     Chest wall: No tenderness.  Abdominal:     General: Bowel sounds are normal.     Palpations: Abdomen is soft.  Musculoskeletal:        General: Normal range of motion.     Cervical back: Normal range of motion and neck supple.  Lymphadenopathy:     Cervical: No  cervical adenopathy.  Skin:    General: Skin is warm and dry.  Neurological:     Mental Status: She is alert and oriented to person, place, and time.     Cranial Nerves: No cranial nerve deficit.  Psychiatric:        Behavior: Behavior normal.        Thought Content: Thought content normal.        Judgment: Judgment normal.     Assessment/Plan:  1. Benign hypertension continue to take all meds as prescribed, she has multiple allergies to meds.takes Clonidine 0.1 mg prn for elevated bp > 160 prn   2. Restless leg syndrome Continue Requip and Klonopin at night, has failed    3. Generalized anxiety disorder Continue Klonopin prn only    4. Generalized osteoarthritis of multiple sites Pt has severe OA   5. Other irritable bowel syndrome Stable   General Counseling: hoda hon understanding of the findings of todays visit and agrees with plan of treatment. I have discussed any further diagnostic evaluation that may be needed or ordered today. We also reviewed her medications today. she has been encouraged to call the office with any questions or concerns that should arise related to todays visit.   Total time spent: 30 Minutes Time spent includes review of chart, medications, test results, and follow up plan with the patient.   Dr Lavera Guise Internal medicine

## 2020-07-25 DIAGNOSIS — Z96641 Presence of right artificial hip joint: Secondary | ICD-10-CM | POA: Diagnosis not present

## 2020-07-25 DIAGNOSIS — M5416 Radiculopathy, lumbar region: Secondary | ICD-10-CM | POA: Diagnosis not present

## 2020-08-03 DIAGNOSIS — H35051 Retinal neovascularization, unspecified, right eye: Secondary | ICD-10-CM | POA: Diagnosis not present

## 2020-08-08 DIAGNOSIS — H353211 Exudative age-related macular degeneration, right eye, with active choroidal neovascularization: Secondary | ICD-10-CM | POA: Diagnosis not present

## 2020-08-31 DIAGNOSIS — B351 Tinea unguium: Secondary | ICD-10-CM | POA: Diagnosis not present

## 2020-08-31 DIAGNOSIS — M79674 Pain in right toe(s): Secondary | ICD-10-CM | POA: Diagnosis not present

## 2020-08-31 DIAGNOSIS — M79675 Pain in left toe(s): Secondary | ICD-10-CM | POA: Diagnosis not present

## 2020-09-13 DIAGNOSIS — H353211 Exudative age-related macular degeneration, right eye, with active choroidal neovascularization: Secondary | ICD-10-CM | POA: Diagnosis not present

## 2020-10-06 ENCOUNTER — Other Ambulatory Visit: Payer: Self-pay | Admitting: Internal Medicine

## 2020-10-06 NOTE — Telephone Encounter (Signed)
Last visit 07/19/20 next 2/22 last pres done in 5/21

## 2020-10-20 DIAGNOSIS — H353211 Exudative age-related macular degeneration, right eye, with active choroidal neovascularization: Secondary | ICD-10-CM | POA: Diagnosis not present

## 2020-10-20 DIAGNOSIS — H35372 Puckering of macula, left eye: Secondary | ICD-10-CM | POA: Diagnosis not present

## 2020-10-20 DIAGNOSIS — H40052 Ocular hypertension, left eye: Secondary | ICD-10-CM | POA: Diagnosis not present

## 2020-10-28 DIAGNOSIS — H353210 Exudative age-related macular degeneration, right eye, stage unspecified: Secondary | ICD-10-CM | POA: Diagnosis not present

## 2020-11-10 DIAGNOSIS — R059 Cough, unspecified: Secondary | ICD-10-CM | POA: Diagnosis not present

## 2020-11-10 DIAGNOSIS — R053 Chronic cough: Secondary | ICD-10-CM | POA: Diagnosis not present

## 2020-11-10 DIAGNOSIS — K581 Irritable bowel syndrome with constipation: Secondary | ICD-10-CM | POA: Diagnosis not present

## 2020-11-10 DIAGNOSIS — K219 Gastro-esophageal reflux disease without esophagitis: Secondary | ICD-10-CM | POA: Diagnosis not present

## 2020-11-22 ENCOUNTER — Encounter: Payer: Self-pay | Admitting: Internal Medicine

## 2020-11-22 ENCOUNTER — Ambulatory Visit (INDEPENDENT_AMBULATORY_CARE_PROVIDER_SITE_OTHER): Payer: Medicare Other | Admitting: Internal Medicine

## 2020-11-22 VITALS — BP 146/100 | HR 78 | Temp 98.1°F | Resp 16 | Ht <= 58 in | Wt 81.8 lb

## 2020-11-22 DIAGNOSIS — I10 Essential (primary) hypertension: Secondary | ICD-10-CM | POA: Diagnosis not present

## 2020-11-22 DIAGNOSIS — K219 Gastro-esophageal reflux disease without esophagitis: Secondary | ICD-10-CM | POA: Diagnosis not present

## 2020-11-22 DIAGNOSIS — E039 Hypothyroidism, unspecified: Secondary | ICD-10-CM

## 2020-11-22 DIAGNOSIS — F411 Generalized anxiety disorder: Secondary | ICD-10-CM

## 2020-11-22 MED ORDER — CLONIDINE HCL 0.1 MG PO TABS: 0.1000 mg | ORAL_TABLET | Freq: Two times a day (BID) | ORAL | 1 refills | Status: DC

## 2020-11-22 NOTE — Progress Notes (Signed)
[facility] [place], [place] [address]  Internal MEDICINE  Office Visit Note  Patient Name: Pam Cruz  [id number]  [id number]  Date of Service: 11/22/2020  Chief Complaint  Patient presents with  . Hypertension  . Anxiety  . COPD    HPI  Pt is here for follow up. She has multiple medical problems but has been doing fairly well. BP is elevated here today. She does take Clonidine periodically for elevated blood pressure. Cannot take diuretics due to h/o severe hyponatremia with mental status changes and hospitalization in the past. Has intolerance multiple other medications as well. In 2020 she has total right hip replacement due to pain and difficulty walking.  She has been a smoker in the past and has mild copd Chronic GI, abdominal pain, GERD and IBS. Followed by GI  Severe anxiety disorder as well. She is also worried about her eyes due to macular degeneration and has been having problem with her vision   Current Medication: Outpatient Encounter Medications as of 11/22/2020  Medication Sig Note  . acetaminophen (TYLENOL) 500 MG tablet Take 500 mg by mouth every 6 (six) hours as needed (pain).    Marland Kitchen amLODipine (NORVASC) 2.5 MG tablet Take 1 tablet (2.5 mg total) by mouth daily.   . clonazePAM (KLONOPIN) 0.5 MG tablet TAKE 1 TABLET BY MOUTH  TWICE DAILY AS NEEDED FOR  ANXIETY   . desipramine (NORPRAMIN) 25 MG tablet TAKE 1 TABLET BY MOUTH  DAILY   . fluticasone (FLONASE) 50 MCG/ACT nasal spray USE 2 SPRAYS IN BOTH  NOSTRILS AT NIGHT   . Fluticasone-Salmeterol (ADVAIR) 250-50 MCG/DOSE AEPB Inhale 1 puff into the lungs 2 (two) times daily.   . Homeopathic Products ([place]) FOAM Apply 2 application topically See admin instructions. Apply two applications to various areas of the body at bedtime according to bottle instructions 01/29/2020: Medication at bedside   . hydrALAZINE (APRESOLINE) 25 MG tablet Take 1 tablet (25 mg total) by mouth 3 (three)  times daily.   Marland Kitchen levothyroxine (SYNTHROID) 50 MCG tablet Take 1 tablet (50 mcg total) by mouth daily.   Marland Kitchen losartan (COZAAR) 50 MG tablet TAKE 1 TABLET BY MOUTH  TWICE DAILY   . Multiple Vitamins-Minerals (PRESERVISION AREDS 2+MULTI VIT) CAPS Take 1 capsule by mouth in the morning and at bedtime.   Marland Kitchen omeprazole (PRILOSEC) 20 MG capsule Take 20 mg by mouth 2 (two) times daily before a meal.    . ondansetron (ZOFRAN) 4 MG tablet Take 1 tablet (4 mg total) by mouth every 8 (eight) hours as needed for nausea or vomiting.   . Polyethylene Glycol 400 (BLINK TEARS) 0.25 % GEL Place 1-2 drops into both eyes as needed (dry eyes). 01/29/2020: Medication at bedside  . rOPINIRole (REQUIP) 0.25 MG tablet Take one tab po qhs for leg cramps   . sennosides-docusate sodium (SENOKOT-S) 8.6-50 MG tablet Take 1-2 tablets by mouth at bedtime.    . terbinafine (LAMISIL) 1 % cream Apply 1 application topically 2 (two) times daily as needed (toes).    . [DISCONTINUED] cloNIDine (CATAPRES) 0.1 MG tablet Take 1 tablet (0.1 mg total) by mouth 2 (two) times daily. For blood pressure above 170 as needed only   . cloNIDine (CATAPRES) 0.1 MG tablet Take 1 tablet (0.1 mg total) by mouth 2 (two) times daily. For blood pressure above 160 as needed only    No facility-administered encounter medications on file as of 11/22/2020.    Surgical History:  Past Surgical History:  Procedure Laterality Date  . COLONOSCOPY    . cysto    . LAPAROSCOPY    . TOTAL HIP ARTHROPLASTY Right 08/17/2019   Procedure: TOTAL HIP ARTHROPLASTY;  Surgeon: Dereck Leep, MD;  Location: [facility] ORS;  Service: Orthopedics;  Laterality: Right;    Medical History: Past Medical History:  Diagnosis Date  . Anxiety   . Arthritis   . COPD (chronic obstructive pulmonary disease) ([place])   . Dyspnea   . Gastritis   . GERD (gastroesophageal reflux disease)   . Hypertension   . Osteoporosis     Family History: Family History  Problem Relation Age of Onset   . Hypertension Mother   . Coronary artery disease Father   . Heart disease Father     Social History   Socioeconomic History  . Marital status: Single    Spouse name: Not on file  . Number of children: Not on file  . Years of education: Not on file  . Highest education level: Not on file  Occupational History  . Not on file  Tobacco Use  . Smoking status: Former Smoker    Quit date: 02/20/2001    Years since quitting: 19.7  . Smokeless tobacco: Never Used  . Tobacco comment: quit 17 years ago  Vaping Use  . Vaping Use: Never used  Substance and Sexual Activity  . Alcohol use: Yes    Alcohol/week: 2.0 standard drinks    Types: 2 Glasses of wine per week    Comment: very rarely  . Drug use: Never  . Sexual activity: Not on file  Other Topics Concern  . Not on file  Social History Narrative  . Not on file   Social Determinants of Health   Financial Resource Strain: Not on file  Food Insecurity: Not on file  Transportation Needs: Not on file  Physical Activity: Not on file  Stress: Not on file  Social Connections: Not on file  Intimate Partner Violence: Not on file      Review of Systems  Constitutional: Negative for chills, diaphoresis and fatigue.  HENT: Negative for ear pain, postnasal drip and sinus pressure.   Eyes: Negative for photophobia, discharge, redness, itching and visual disturbance.  Respiratory: Negative for cough, shortness of breath and wheezing.   Cardiovascular: Negative for chest pain, palpitations and leg swelling.  Gastrointestinal: Negative for abdominal pain, constipation, diarrhea, nausea and vomiting.  Genitourinary: Negative for dysuria and flank pain.  Musculoskeletal: Negative for arthralgias, back pain, gait problem and neck pain.  Skin: Negative for color change.  Allergic/Immunologic: Negative for environmental allergies and food allergies.  Neurological: Negative for dizziness and headaches.  Hematological: Does not  bruise/bleed easily.  Psychiatric/Behavioral: Negative for agitation, behavioral problems (depression) and hallucinations.    Vital Signs: BP (!) 146/100   Pulse 78   Temp 98.1 F (36.7 C)   Resp 16   Ht 4\' 8"  (1.422 m)   Wt 81 lb 12.8 oz (37.1 kg)   SpO2 96%   BMI 18.34 kg/m    Physical Exam Constitutional:      General: She is not in acute distress.    Appearance: She is well-developed. She is not diaphoretic.  HENT:     Head: Normocephalic and atraumatic.     Mouth/Throat:     Pharynx: No oropharyngeal exudate.  Eyes:     Pupils: Pupils are equal, round, and reactive to light.  Neck:     Thyroid: No thyromegaly.  Vascular: No JVD.     Trachea: No tracheal deviation.  Cardiovascular:     Rate and Rhythm: Normal rate and regular rhythm.     Heart sounds: Normal heart sounds. No murmur heard. No friction rub. No gallop.   Pulmonary:     Effort: Pulmonary effort is normal. No respiratory distress.     Breath sounds: No wheezing or rales.  Chest:     Chest wall: No tenderness.  Abdominal:     General: Bowel sounds are normal.     Palpations: Abdomen is soft.  Musculoskeletal:        General: Normal range of motion.     Cervical back: Normal range of motion and neck supple.  Lymphadenopathy:     Cervical: No cervical adenopathy.  Skin:    General: Skin is warm and dry.  Neurological:     Mental Status: She is alert and oriented to person, place, and time.     Cranial Nerves: No cranial nerve deficit.  Psychiatric:        Behavior: Behavior normal.        Thought Content: Thought content normal.        Judgment: Judgment normal.        Assessment/Plan: 1. Benign hypertension Continue Hydralazine and Norvasc and Cozaar as before, add Clonidine as orescribed today  - Basic Metabolic Panel (BMET) - cloNIDine (CATAPRES) 0.1 MG tablet; Take 1 tablet (0.1 mg total) by mouth 2 (two) times daily. For blood pressure above 160 as needed only  Dispense: 180  tablet; Refill: 1  2. Generalized anxiety disorder Continue Klonopin as before   3. Hypothyroidism, unspecified type Continue Synthroid  - TSH + free T4  4. Gastroesophageal reflux disease without esophagitis Controlled with meds   General Counseling: brytnee bechler understanding of the findings of todays visit and agrees with plan of treatment. I have discussed any further diagnostic evaluation that may be needed or ordered today. We also reviewed her medications today. she has been encouraged to call the office with any questions or concerns that should arise related to todays visit.    Orders Placed This Encounter  Procedures  . Basic Metabolic Panel (BMET)  . TSH + free T4    Meds ordered this encounter  Medications  . cloNIDine (CATAPRES) 0.1 MG tablet    Sig: Take 1 tablet (0.1 mg total) by mouth 2 (two) times daily. For blood pressure above 160 as needed only    Dispense:  180 tablet    Refill:  1    Total time spent:30 Minutes Time spent includes review of chart, medications, test results, and follow up plan with the patient.   [place] Controlled Substance Database was reviewed by me.   Dr Lavera Guise Internal medicine

## 2020-11-23 LAB — TSH+FREE T4
Free T4: 1.27 ng/dL (ref 0.82–1.77)
TSH: 5.24 u[IU]/mL — ABNORMAL HIGH (ref 0.450–4.500)

## 2020-11-23 LAB — BASIC METABOLIC PANEL
BUN/Creatinine Ratio: 24 (ref 12–28)
BUN: 20 mg/dL (ref 8–27)
CO2: 22 mmol/L (ref 20–29)
Calcium: 10.1 mg/dL (ref 8.7–10.3)
Chloride: 105 mmol/L (ref 96–106)
Creatinine, Ser: 0.84 mg/dL (ref 0.57–1.00)
GFR calc Af Amer: 74 mL/min/{1.73_m2} (ref >59)
GFR calc non Af Amer: 64 mL/min/{1.73_m2} (ref >59)
Glucose: 87 mg/dL (ref 65–99)
Potassium: 5.2 mmol/L (ref 3.5–5.2)
Sodium: 142 mmol/L (ref 134–144)

## 2020-11-30 DIAGNOSIS — L851 Acquired keratosis [keratoderma] palmaris et plantaris: Secondary | ICD-10-CM | POA: Diagnosis not present

## 2020-11-30 DIAGNOSIS — B351 Tinea unguium: Secondary | ICD-10-CM | POA: Diagnosis not present

## 2020-11-30 DIAGNOSIS — M79675 Pain in left toe(s): Secondary | ICD-10-CM | POA: Diagnosis not present

## 2020-11-30 DIAGNOSIS — M79674 Pain in right toe(s): Secondary | ICD-10-CM | POA: Diagnosis not present

## 2020-12-08 DIAGNOSIS — H35051 Retinal neovascularization, unspecified, right eye: Secondary | ICD-10-CM | POA: Diagnosis not present

## 2021-01-07 ENCOUNTER — Other Ambulatory Visit: Payer: Self-pay | Admitting: Internal Medicine

## 2021-01-09 NOTE — Telephone Encounter (Signed)
Can you please send as per dr Humphrey Rolls its ok

## 2021-01-09 NOTE — Telephone Encounter (Signed)
Please see

## 2021-01-16 ENCOUNTER — Telehealth: Payer: Self-pay | Admitting: Internal Medicine

## 2021-01-16 NOTE — Progress Notes (Signed)
  Chronic Care Management   Outreach Note  01/16/2021 Name: Pam Cruz MRN: [id number] DOB: June 17, 1935  Referred by: Lavera Guise, MD Reason for referral : No chief complaint on file.   An unsuccessful telephone outreach was attempted today. The patient was referred to the pharmacist for assistance with care management and care coordination.   Follow Up Plan:   [name] [employer] Scheduler

## 2021-01-20 ENCOUNTER — Telehealth: Payer: Self-pay | Admitting: Internal Medicine

## 2021-01-20 DIAGNOSIS — H35059 Retinal neovascularization, unspecified, unspecified eye: Secondary | ICD-10-CM | POA: Diagnosis not present

## 2021-01-20 DIAGNOSIS — H353210 Exudative age-related macular degeneration, right eye, stage unspecified: Secondary | ICD-10-CM | POA: Diagnosis not present

## 2021-01-20 NOTE — Progress Notes (Signed)
  Chronic Care Management   Outreach Note  01/20/2021 Name: Pam Cruz MRN: [id number] DOB: 06-Mar-1935  Referred by: Lavera Guise, MD Reason for referral : No chief complaint on file.   A second unsuccessful telephone outreach was attempted today. The patient was referred to pharmacist for assistance with care management and care coordination.  Follow Up Plan:   [name] [employer] Scheduler

## 2021-02-09 DIAGNOSIS — H40052 Ocular hypertension, left eye: Secondary | ICD-10-CM | POA: Diagnosis not present

## 2021-02-10 ENCOUNTER — Telehealth: Payer: Self-pay | Admitting: Internal Medicine

## 2021-02-10 NOTE — Progress Notes (Signed)
  Chronic Care Management   Outreach Note  02/10/2021 Name: Pam Cruz MRN: [id number] DOB: 1934-11-14  Referred by: Lavera Guise, MD Reason for referral : No chief complaint on file.   A second unsuccessful telephone outreach was attempted today. The patient was referred to pharmacist for assistance with care management and care coordination.  Follow Up Plan:   [name] [employer] Scheduler

## 2021-02-14 ENCOUNTER — Telehealth: Payer: Self-pay | Admitting: Internal Medicine

## 2021-02-14 NOTE — Progress Notes (Signed)
  Chronic Care Management   Note  02/14/2021 Name: Pam Cruz MRN: [id number] DOB: 1935-02-13  Pam Cruz is a 86 y.o. year old female who is a primary care patient of Lavera Guise, MD. I reached out to [name] by phone today in response to a referral sent by [name]'s PCP, Lavera Guise, MD.   [name] was given information about Chronic Care Management services today including:  1. CCM service includes personalized support from designated clinical staff supervised by her physician, including individualized plan of care and coordination with other care providers 2. 24/7 contact phone numbers for assistance for urgent and routine care needs. 3. Service will only be billed when office clinical staff spend 20 minutes or more in a month to coordinate care. 4. Only one practitioner may furnish and bill the service in a calendar month. 5. The patient may stop CCM services at any time (effective at the end of the month) by phone call to the office staff.   Patient agreed to services and verbal consent obtained.   Follow up plan:   [name] [employer] Scheduler

## 2021-02-22 DIAGNOSIS — H40053 Ocular hypertension, bilateral: Secondary | ICD-10-CM | POA: Diagnosis not present

## 2021-03-08 ENCOUNTER — Other Ambulatory Visit: Payer: Self-pay | Admitting: Internal Medicine

## 2021-03-09 DIAGNOSIS — H353210 Exudative age-related macular degeneration, right eye, stage unspecified: Secondary | ICD-10-CM | POA: Diagnosis not present

## 2021-03-10 ENCOUNTER — Other Ambulatory Visit: Payer: Self-pay | Admitting: Internal Medicine

## 2021-03-17 ENCOUNTER — Telehealth: Payer: Self-pay

## 2021-03-17 NOTE — Telephone Encounter (Signed)
lvm for patient to arrive @ 11:20 for 03-21-21 appointment-Toni

## 2021-03-20 ENCOUNTER — Telehealth: Payer: Self-pay | Admitting: Pharmacist

## 2021-03-20 NOTE — Progress Notes (Addendum)
Chronic Care Management Pharmacy Assistant   Name: ROSENDA GEFFRARD  MRN: [id number] DOB: [birth year]  Pam Cruz is an 86 y.o. year old female who presents for his initial CCM visit with the clinical pharmacist.  Reason for Encounter: Chart Prep   Conditions to be addressed/monitored: HTN, COPD, GERD, Anxiety, Hypothyroidism.  Primary concerns for visit include: HTN.  Recent office visits:  11/22/20 Lavera Guise, MD. For follow-up. CHANGED Clonidine 01. MG, 2 times daily, For blood pressure above 160 as needed only.  Recent consult visits:  11/30/20 Podiatry [name], DPM. For foot care. No medication changes.  11/10/20 [name], Stana Bunting, NP and [place], Malena Catholic, MD. For follow-up. No medication changes.  10/28/20 Ophthalmology Isaias Sakai. For Exudative age-related macular degeneration of right eye. No medication changes.  10/20/20 Ophthalmology Isaias Sakai. For Exudative age-related macular degeneration of right eye. No information given.   Hospital visits:  None in previous 6 months  Medication History: Losartan 50 mg 90 DS 03/08/21  Medications: Outpatient Encounter Medications as of 03/20/2021  Medication Sig Note   acetaminophen (TYLENOL) 500 MG tablet Take 500 mg by mouth every 6 (six) hours as needed (pain).     amLODipine (NORVASC) 2.5 MG tablet TAKE 1 TABLET BY MOUTH  DAILY    clonazePAM (KLONOPIN) 0.5 MG tablet TAKE 1 TABLET BY MOUTH  TWICE DAILY AS NEEDED FOR  ANXIETY    cloNIDine (CATAPRES) 0.1 MG tablet Take 1 tablet (0.1 mg total) by mouth 2 (two) times daily. For blood pressure above 160 as needed only    desipramine (NORPRAMIN) 25 MG tablet TAKE 1 TABLET BY MOUTH  DAILY    fluticasone (FLONASE) 50 MCG/ACT nasal spray USE 2 SPRAYS IN BOTH  NOSTRILS AT NIGHT    Fluticasone-Salmeterol (ADVAIR) 250-50 MCG/DOSE AEPB Inhale 1 puff into the lungs 2 (two) times daily.    Homeopathic Products ([place]) FOAM Apply 2 application  topically See admin instructions. Apply two applications to various areas of the body at bedtime according to bottle instructions 01/29/2020: Medication at bedside    hydrALAZINE (APRESOLINE) 25 MG tablet TAKE 1 TABLET BY MOUTH 3  TIMES DAILY    levothyroxine (SYNTHROID) 50 MCG tablet Take 1 tablet (50 mcg total) by mouth daily.    losartan (COZAAR) 50 MG tablet TAKE 1 TABLET BY MOUTH  TWICE DAILY    Multiple Vitamins-Minerals (PRESERVISION AREDS 2+MULTI VIT) CAPS Take 1 capsule by mouth in the morning and at bedtime.    omeprazole (PRILOSEC) 20 MG capsule Take 20 mg by mouth 2 (two) times daily before a meal.     ondansetron (ZOFRAN) 4 MG tablet Take 1 tablet (4 mg total) by mouth every 8 (eight) hours as needed for nausea or vomiting.    Polyethylene Glycol 400 (BLINK TEARS) 0.25 % GEL Place 1-2 drops into both eyes as needed (dry eyes). 01/29/2020: Medication at bedside   rOPINIRole (REQUIP) 0.25 MG tablet Take one tab po qhs for leg cramps    sennosides-docusate sodium (SENOKOT-S) 8.6-50 MG tablet Take 1-2 tablets by mouth at bedtime.     terbinafine (LAMISIL) 1 % cream Apply 1 application topically 2 (two) times daily as needed (toes).     No facility-administered encounter medications on file as of 03/20/2021.   Have you seen any other providers since your last visit? Patient stated she seen her eye doctor, she stated she gets eye conditions.  Any changes in your medications or health? Patient stated  no.  Any side effects from any medications? Patient stated she has been having red/white spots on her skin. She stated she has noticed she is having some easy bleeding/bruising.  Do you have an symptoms or problems not managed by your medications? Patient stated no.  Any concerns about your health right now? Patient stated no.  Has your provider asked that you check blood pressure, blood sugar, or follow special diet at home? Patient stated no. She stated she can not check her blood  pressure.  Do you get any type of exercise on a regular basis? Patient stated 4-5 times a week she does exercises in the house. Ex: weight lifting, walking in place.  Can you think of a goal you would like to reach for your health? Patient stated she would like to get better.   Do you have any problems getting your medications? Patient stated no.  Is there anything that you would like to discuss during the appointment? Patient stated no.  Please bring medications and supplements to appointment, patient reminded of her face to face appointment on 03/21/21 at 3 pm.   [place], [place] Pharmacist Assistant [phone]

## 2021-03-21 ENCOUNTER — Other Ambulatory Visit: Payer: Self-pay

## 2021-03-21 ENCOUNTER — Other Ambulatory Visit: Payer: Self-pay | Admitting: Internal Medicine

## 2021-03-21 ENCOUNTER — Telehealth: Payer: Medicare Other

## 2021-03-21 ENCOUNTER — Ambulatory Visit (INDEPENDENT_AMBULATORY_CARE_PROVIDER_SITE_OTHER): Payer: Medicare Other | Admitting: Internal Medicine

## 2021-03-21 VITALS — BP 140/80 | HR 85 | Temp 98.6°F | Resp 16 | Ht <= 58 in | Wt 79.2 lb

## 2021-03-21 DIAGNOSIS — L709 Acne, unspecified: Secondary | ICD-10-CM

## 2021-03-21 DIAGNOSIS — R300 Dysuria: Secondary | ICD-10-CM | POA: Diagnosis not present

## 2021-03-21 DIAGNOSIS — Z0001 Encounter for general adult medical examination with abnormal findings: Secondary | ICD-10-CM | POA: Diagnosis not present

## 2021-03-21 DIAGNOSIS — E039 Hypothyroidism, unspecified: Secondary | ICD-10-CM

## 2021-03-21 DIAGNOSIS — F411 Generalized anxiety disorder: Secondary | ICD-10-CM | POA: Diagnosis not present

## 2021-03-21 DIAGNOSIS — Z1231 Encounter for screening mammogram for malignant neoplasm of breast: Secondary | ICD-10-CM

## 2021-03-21 DIAGNOSIS — I10 Essential (primary) hypertension: Secondary | ICD-10-CM

## 2021-03-21 NOTE — Progress Notes (Signed)
[name], [place] [address]  Internal MEDICINE  Office Visit Note  Patient Name: Pam Cruz  [id number]  [id number]  Date of Service: 03/30/2021  Chief Complaint  Patient presents with   Medicare Wellness    Review meds,    Gastroesophageal Reflux   Hypertension   COPD   Anxiety   Quality Metric Gaps    Pneumovax Cruz last year, shingrix     HPI Pt is here for routine health maintenance examination Pt feels well  1. Leg veins are prominent 2. Spots on arms ( bruise) she complains of acne on her face as well concerned about spots on her back and arms 3. Abrasion but improving Patient has multiple medical problems including  COPD, HTN and GERD.  Pam Cruz seems to be under good control as well.  She has history of hyponatremia in the past and is very careful with her diuretic use. Chronic abdominal pain, IBS is thinking about tapering down some of the medications including desipramine. For periods of fluctuating hypertension or elevated blood pressure she does use clonidine as needed Recent hip replacement, has Cruz well  Current Medication: Outpatient Encounter Medications as of 03/21/2021  Medication Sig Note   acetaminophen (TYLENOL) 500 MG tablet Take 500 mg by mouth every 6 (six) hours as needed (pain).     amLODipine (NORVASC) 2.5 MG tablet TAKE 1 TABLET BY MOUTH  DAILY    clonazePAM (KLONOPIN) 0.5 MG tablet TAKE 1 TABLET BY MOUTH  TWICE DAILY AS NEEDED FOR  ANXIETY    cloNIDine (CATAPRES) 0.1 MG tablet Take 1 tablet (0.1 mg total) by mouth 2 (two) times daily. For blood pressure above 160 as needed only    desipramine (NORPRAMIN) 25 MG tablet TAKE 1 TABLET BY MOUTH  DAILY    Fluticasone-Salmeterol (ADVAIR) 250-50 MCG/DOSE AEPB Inhale 1 puff into the lungs 2 (two) times daily.    Homeopathic Products ([place]) FOAM Apply 2 application topically See admin instructions. Apply two applications to various areas of the body  at bedtime according to bottle instructions 01/29/2020: Medication at bedside    hydrALAZINE (APRESOLINE) 25 MG tablet TAKE 1 TABLET BY MOUTH 3  TIMES DAILY    levothyroxine (SYNTHROID) 50 MCG tablet Take 1 tablet (50 mcg total) by mouth daily.    losartan (COZAAR) 50 MG tablet TAKE 1 TABLET BY MOUTH  TWICE DAILY    Multiple Vitamins-Minerals (PRESERVISION AREDS 2+MULTI VIT) CAPS Take 1 capsule by mouth in the morning and at bedtime.    omeprazole (PRILOSEC) 20 MG capsule Take 20 mg by mouth 2 (two) times daily before a meal.     Polyethylene Glycol 400 (BLINK TEARS) 0.25 % GEL Place 1-2 drops into both eyes as needed (dry eyes). 01/29/2020: Medication at bedside   sennosides-docusate sodium (SENOKOT-S) 8.6-50 MG tablet Take 1-2 tablets by mouth at bedtime.     terbinafine (LAMISIL) 1 % cream Apply 1 application topically 2 (two) times daily as needed (toes).     [DISCONTINUED] fluticasone (FLONASE) 50 MCG/ACT nasal spray USE 2 SPRAYS IN BOTH  NOSTRILS AT NIGHT    [DISCONTINUED] ondansetron (ZOFRAN) 4 MG tablet Take 1 tablet (4 mg total) by mouth every 8 (eight) hours as needed for nausea or vomiting.    [DISCONTINUED] rOPINIRole (REQUIP) 0.25 MG tablet Take one tab po qhs for leg cramps    No facility-administered encounter medications on file as of 03/21/2021.    Surgical History: Past Surgical History:  Procedure Laterality Date   COLONOSCOPY     cysto     LAPAROSCOPY     TOTAL HIP ARTHROPLASTY Right 08/17/2019   Procedure: TOTAL HIP ARTHROPLASTY;  Surgeon: Dereck Leep, MD;  Location: [facility] ORS;  Service: Orthopedics;  Laterality: Right;    Medical History: Past Medical History:  Diagnosis Date   Anxiety    Arthritis    COPD (chronic obstructive pulmonary disease) (HCC)    Dyspnea    Gastritis    GERD (gastroesophageal reflux disease)    Hypertension    Osteoporosis     Family History: Family History  Problem Relation Age of Onset   Hypertension Mother    Coronary artery  disease Father    Heart disease Father     Social History: Social History   Socioeconomic History   Marital status: Single    Spouse name: Not on file   Number of children: Not on file   Years of education: Not on file   Highest education level: Not on file  Occupational History   Not on file  Tobacco Use   Smoking status: Former    Pack years: 0.00    Types: Cigarettes    Quit date: 02/20/2001    Years since quitting: 20.1   Smokeless tobacco: Never   Tobacco comments:    quit 17 years ago  Vaping Use   Vaping Use: Never used  Substance and Sexual Activity   Alcohol use: Yes    Alcohol/week: 2.0 standard drinks    Types: 2 Glasses of wine per week    Comment: very rarely   Drug use: Never   Sexual activity: Not on file  Other Topics Concern   Not on file  Social History Narrative   Not on file   Social Determinants of Health   Financial Resource Strain: Not on file  Food Insecurity: Not on file  Transportation Needs: Not on file  Physical Activity: Not on file  Stress: Not on file  Social Connections: Not on file      Review of Systems  Constitutional:  Negative for activity change, appetite change, chills, diaphoresis, fatigue, fever and unexpected weight change.  HENT:  Negative for congestion, ear discharge, ear pain, facial swelling, hearing loss, nosebleeds, postnasal drip, rhinorrhea, sinus pressure, sinus pain, sneezing, sore throat, tinnitus, trouble swallowing and voice change.   Eyes:  Negative for photophobia, pain, discharge, redness, itching and visual disturbance.  Respiratory:  Negative for apnea, cough, choking, chest tightness, shortness of breath, wheezing and stridor.   Cardiovascular:  Negative for chest pain, palpitations and leg swelling.  Gastrointestinal:  Negative for abdominal distention, abdominal pain, anal bleeding, constipation, diarrhea, nausea, rectal pain and vomiting.  Endocrine: Negative for cold intolerance, heat intolerance,  polydipsia, polyphagia and polyuria.  Genitourinary:  Negative for difficulty urinating, dysuria, flank pain, frequency, genital sores, hematuria, menstrual problem, pelvic pain, urgency, vaginal bleeding, vaginal discharge and vaginal pain.  Musculoskeletal:  Negative for arthralgias, back pain, gait problem, joint swelling, myalgias and neck pain.  Skin:  Negative for color change, pallor, rash and wound.  Allergic/Immunologic: Negative for environmental allergies, food allergies and immunocompromised state.  Neurological:  Negative for dizziness, seizures, syncope, facial asymmetry, speech difficulty, weakness, light-headedness, numbness and headaches.  Hematological:  Negative for adenopathy. Does not bruise/bleed easily.  Psychiatric/Behavioral:  Negative for agitation, behavioral problems (depression), confusion, decreased concentration, dysphoric mood, hallucinations, self-injury, sleep disturbance and suicidal ideas. The patient is not nervous/anxious and is not hyperactive.  Vital Signs: BP 140/80 Comment: 170/94  Pulse 85   Temp 98.6 F (37 C)   Resp 16   Ht 4\' 8"  (1.422 m)   Wt 79 lb 3.2 oz (35.9 kg)   SpO2 97%   BMI 17.76 kg/m    Physical Exam Constitutional:      General: She is not in acute distress.    Appearance: She is well-developed. She is not diaphoretic.  HENT:     Head: Normocephalic and atraumatic.     Mouth/Throat:     Pharynx: No oropharyngeal exudate.  Eyes:     Pupils: Pupils are equal, round, and reactive to light.  Neck:     Thyroid: No thyromegaly.     Vascular: No JVD.     Trachea: No tracheal deviation.  Cardiovascular:     Rate and Rhythm: Normal rate and regular rhythm.     Heart sounds: Normal heart sounds. No murmur heard.   No friction rub. No gallop.  Pulmonary:     Effort: Pulmonary effort is normal. No respiratory distress.     Breath sounds: No wheezing or rales.  Chest:     Chest wall: No tenderness.  Abdominal:     General:  Bowel sounds are normal.     Palpations: Abdomen is soft.  Musculoskeletal:        General: Normal range of motion.     Cervical back: Normal range of motion and neck supple.  Lymphadenopathy:     Cervical: No cervical adenopathy.  Skin:    General: Skin is warm and dry.     Findings: Lesion and rash present.  Neurological:     Mental Status: She is alert and oriented to person, place, and time.     Cranial Nerves: No cranial nerve deficit.  Psychiatric:        Behavior: Behavior normal.        Thought Content: Thought content normal.        Judgment: Judgment normal.     Assessment/Plan: 1. Encounter for general adult medical examination with abnormal findings On preventive pain age-related health maintenance is updated.  She is very functional encouraged her to keep her lifestyle activity as before  2. Benign hypertension Initial blood pressure was elevated patient was instructed to monitor blood pressure at home take clonidine for periods of elevated blood pressure more than 466 systolic  continue on hydralazine losartan and amlodipine as before, continue to monitor sodium levels - Basic Metabolic Panel (BMET)  3. Generalized anxiety disorder Controlling medications will continue as before, Klonopin as needed  4. Hypothyroidism, unspecified type Continue Synthroid  5. Screening mammogram, encounter for Mammogram is ordered - MM Digital Screening; Future  6. Acne, unspecified acne type Patient is concerned about her facial acne and some spots on her back will refer her to dermatology - Ambulatory referral to Dermatology  7. Dysuria  - UA/M w/rflx Culture, Routine - Microscopic Examination   General Counseling: loann chahal understanding of the findings of todays visit and agrees with plan of treatment. I have discussed any further diagnostic evaluation that may be needed or ordered today. We also reviewed her medications today. she has been encouraged to call the  office with any questions or concerns that should arise related to todays visit.    Counseling:  [place] Controlled Substance Database was reviewed by me.  Orders Placed This Encounter  Procedures   Microscopic Examination   MM Digital Screening   Basic Metabolic Panel (BMET)  UA/M w/rflx Culture, Routine   Ambulatory referral to Dermatology    No orders of the defined types were placed in this encounter.   Total time spent:30 Minutes  Time spent includes review of chart, medications, test results, and follow up plan with the patient.     Lavera Guise, MD  Internal Medicine

## 2021-03-22 ENCOUNTER — Ambulatory Visit: Payer: Self-pay | Admitting: Hospice and Palliative Medicine

## 2021-03-22 LAB — UA/M W/RFLX CULTURE, ROUTINE
Bilirubin, UA: NEGATIVE
Glucose, UA: NEGATIVE
Ketones, UA: NEGATIVE
Leukocytes,UA: NEGATIVE
Nitrite, UA: NEGATIVE
Protein,UA: NEGATIVE
RBC, UA: NEGATIVE
Specific Gravity, UA: 1.021 (ref 1.005–1.030)
Urobilinogen, Ur: 0.2 mg/dL (ref 0.2–1.0)
pH, UA: 6 (ref 5.0–7.5)

## 2021-03-22 LAB — MICROSCOPIC EXAMINATION
Bacteria, UA: NONE SEEN
Casts: NONE SEEN /lpf
Epithelial Cells (non renal): NONE SEEN /hpf (ref 0–10)
RBC, Urine: NONE SEEN /hpf (ref 0–2)
WBC, UA: NONE SEEN /hpf (ref 0–5)

## 2021-03-27 ENCOUNTER — Other Ambulatory Visit: Payer: Self-pay

## 2021-03-27 ENCOUNTER — Ambulatory Visit
Admission: RE | Admit: 2021-03-27 | Discharge: 2021-03-27 | Disposition: A | Discharge status: Home / Self Care | Payer: Medicare Other | Source: Ambulatory Visit | Attending: Internal Medicine | Admitting: Internal Medicine

## 2021-03-27 DIAGNOSIS — Z1231 Encounter for screening mammogram for malignant neoplasm of breast: Secondary | ICD-10-CM | POA: Diagnosis not present

## 2021-03-27 DIAGNOSIS — M249 Joint derangement, unspecified: Secondary | ICD-10-CM | POA: Diagnosis not present

## 2021-03-27 DIAGNOSIS — M79674 Pain in right toe(s): Secondary | ICD-10-CM | POA: Diagnosis not present

## 2021-03-27 DIAGNOSIS — M2011 Hallux valgus (acquired), right foot: Secondary | ICD-10-CM | POA: Diagnosis not present

## 2021-03-27 DIAGNOSIS — M2012 Hallux valgus (acquired), left foot: Secondary | ICD-10-CM | POA: Diagnosis not present

## 2021-03-27 DIAGNOSIS — M2042 Other hammer toe(s) (acquired), left foot: Secondary | ICD-10-CM | POA: Diagnosis not present

## 2021-03-27 DIAGNOSIS — M2041 Other hammer toe(s) (acquired), right foot: Secondary | ICD-10-CM | POA: Diagnosis not present

## 2021-03-27 DIAGNOSIS — M79675 Pain in left toe(s): Secondary | ICD-10-CM | POA: Diagnosis not present

## 2021-03-27 DIAGNOSIS — L603 Nail dystrophy: Secondary | ICD-10-CM | POA: Diagnosis not present

## 2021-03-27 DIAGNOSIS — B351 Tinea unguium: Secondary | ICD-10-CM | POA: Diagnosis not present

## 2021-03-28 ENCOUNTER — Inpatient Hospital Stay
Admission: RE | Admit: 2021-03-28 | Discharge: 2021-03-28 | Disposition: A | Discharge status: Home / Self Care | Payer: Self-pay | Source: Ambulatory Visit | Attending: *Deleted | Admitting: *Deleted

## 2021-03-28 ENCOUNTER — Other Ambulatory Visit: Payer: Self-pay | Admitting: *Deleted

## 2021-03-28 DIAGNOSIS — Z1231 Encounter for screening mammogram for malignant neoplasm of breast: Secondary | ICD-10-CM

## 2021-03-30 ENCOUNTER — Encounter: Payer: Self-pay | Admitting: Internal Medicine

## 2021-04-04 NOTE — Progress Notes (Signed)
Chronic Care Management Pharmacy Note  04/11/2021 Name:  Pam Cruz MRN:  [id number] DOB:  January 26, 1935  Summary: Initial visit with PharmD.  She is not monitoring BP at home, currently does not have a method of doing so.  Takes her clonidine when she feels her BP is high.  Not using maintenance inhaler twice daily.  All medications reviewed and are accurate.  Recommendations/Changes made from today's visit: Set up with remote monitoring device to measure blood pressure.  Plan: FU on BP readings in 2 weeks. FU on phone in 3 months.   Subjective: Pam Cruz is an 86 y.o. year old female who is a primary patient of Welton Flakes, Shannan Harper, MD.  The CCM team was consulted for assistance with disease management and care coordination needs.    Engaged with patient by telephone for initial visit in response to provider referral for pharmacy case management and/or care coordination services.   Consent to Services:  The patient was given the following information about Chronic Care Management services today, agreed to services, and gave verbal consent: 1. CCM service includes personalized support from designated clinical staff supervised by the primary care provider, including individualized plan of care and coordination with other care providers 2. 24/7 contact phone numbers for assistance for urgent and routine care needs. 3. Service will only be billed when office clinical staff spend 20 minutes or more in a month to coordinate care. 4. Only one practitioner may furnish and bill the service in a calendar month. 5.The patient may stop CCM services at any time (effective at the end of the month) by phone call to the office staff. 6. The patient will be responsible for cost sharing (co-pay) of up to 20% of the service fee (after annual deductible is met). Patient agreed to services and consent obtained.  Patient Care Team: Lyndon Code, MD as PCP - General (Internal Medicine) Erroll Luna,  [facility] as Pharmacist (Pharmacist)  Recent office visits:  03/21/21 Beverely Risen - Dysuria, stopped Requip.  Referral to Derm for facial acne. 11/22/20 Lyndon Code, MD. For follow-up. CHANGED Clonidine 01. MG, 2 times daily, For blood pressure above 160 as needed only.   Recent consult visits:  11/30/20 Podiatry Arnold Long, DPM. For foot care. No medication changes. 11/10/20 Elsie Stain, Michele Rockers, NP and Merom, Thomos Lemons, MD. For follow-up. No medication changes. 10/28/20 Ophthalmology Nicholaus Corolla. For Exudative age-related macular degeneration of right eye. No medication changes. 10/20/20 Ophthalmology Nicholaus Corolla. For Exudative age-related macular degeneration of right eye. No information given.    Hospital visits:  None in previous 6 months Objective:  Lab Results  Component Value Date   CREATININE 0.84 11/22/2020   BUN 20 11/22/2020   GFRNONAA 64 11/22/2020   GFRAA 74 11/22/2020   NA 142 11/22/2020   K 5.2 11/22/2020   CALCIUM 10.1 11/22/2020   CO2 22 11/22/2020   GLUCOSE 87 11/22/2020    No results found for: HGBA1C, FRUCTOSAMINE, GFR, MICROALBUR  Last diabetic Eye exam: No results found for: HMDIABEYEEXA  Last diabetic Foot exam: No results found for: HMDIABFOOTEX   Lab Results  Component Value Date   CHOL 164 02/20/2018   HDL 71 02/20/2018   LDLCALC 86 02/20/2018   TRIG 36 02/20/2018    Hepatic Function Latest Ref Rng & Units 01/29/2020 01/19/2020 08/10/2019  Total Protein 6.5 - 8.1 g/dL 7.0 7.3 7.1  Albumin 3.5 - 5.0 g/dL 4.8 4.6 4.6  AST 15 -  41 U/L 29 33 31  ALT 0 - 44 U/L $Remo'21 20 19  'plmkt$ Alk Phosphatase 38 - 126 U/L 80 93 75  Total Bilirubin 0.3 - 1.2 mg/dL 0.8 1.0 0.8    Lab Results  Component Value Date/Time   TSH 5.240 (H) 11/22/2020 01:03 PM   TSH 1.420 03/07/2020 04:30 PM   FREET4 1.27 11/22/2020 01:03 PM   FREET4 1.42 03/07/2020 04:30 PM    CBC Latest Ref Rng & Units 01/30/2020 01/29/2020 01/20/2020  WBC 4.0 - 10.5 K/uL 4.2 7.7 5.6   Hemoglobin 12.0 - 15.0 g/dL 10.8(L) 12.5 12.0  Hematocrit 36.0 - 46.0 % 31.8(L) 37.0 35.8(L)  Platelets 150 - 400 K/uL 290 349 313    No results found for: VD25OH  Clinical ASCVD: No  The ASCVD Risk score Mikey Bussing DC Jr., et al., 2013) failed to calculate for the following reasons:   The 2013 ASCVD risk score is only valid for ages 5 to 68    Depression screen PHQ 2/9 03/21/2021 11/22/2020 07/19/2020  Decreased Interest 0 0 0  Down, Depressed, Hopeless 0 0 0  PHQ - 2 Score 0 0 0  Altered sleeping - - -  Tired, decreased energy - - -  Change in appetite - - -  Feeling bad or failure about yourself  - - -  Trouble concentrating - - -  Moving slowly or fidgety/restless - - -  Suicidal thoughts - - -  PHQ-9 Score - - -      Social History   Tobacco Use  Smoking Status Former   Pack years: 0.00   Types: Cigarettes   Quit date: 02/20/2001   Years since quitting: 20.1  Smokeless Tobacco Never  Tobacco Comments   quit 17 years ago   BP Readings from Last 3 Encounters:  03/21/21 140/80  11/22/20 (!) 146/100  07/19/20 (!) 144/84   Pulse Readings from Last 3 Encounters:  03/21/21 85  11/22/20 78  07/19/20 77   Wt Readings from Last 3 Encounters:  03/21/21 79 lb 3.2 oz (35.9 kg)  11/22/20 81 lb 12.8 oz (37.1 kg)  07/19/20 81 lb 3.2 oz (36.8 kg)   BMI Readings from Last 3 Encounters:  03/21/21 17.76 kg/m  11/22/20 18.34 kg/m  07/19/20 18.20 kg/m    Assessment/Interventions: Review of patient past medical history, allergies, medications, health status, including review of consultants reports, laboratory and other test data, was performed as part of comprehensive evaluation and provision of chronic care management services.   SDOH:  (Social Determinants of Health) assessments and interventions performed: Yes  Financial Resource Strain: Low Risk    Difficulty of Paying Living Expenses: Not very hard    SDOH Screenings   Alcohol Screen: Not on file  Depression  (PHQ2-9): Low Risk    PHQ-2 Score: 0  Financial Resource Strain: Low Risk    Difficulty of Paying Living Expenses: Not very hard  Food Insecurity: Not on file  Housing: Not on file  Physical Activity: Not on file  Social Connections: Not on file  Stress: Not on file  Tobacco Use: Medium Risk   Smoking Tobacco Use: Former   Smokeless Tobacco Use: Never  Transportation Needs: Not on file    [place]  Allergies  Allergen Reactions   Ambien [Zolpidem Tartrate]     Pt unsure    Amoxicillin Nausea And Vomiting   Codeine     Other reaction(s): Abdominal Pain   Erythromycin Nausea And Vomiting   Penicillins Nausea  And Vomiting   Reglan [Metoclopramide] Nausea And Vomiting   Sulfa Antibiotics Nausea And Vomiting    Medications Reviewed Today     Reviewed by Erroll Luna, [facility] (Pharmacist) on 04/11/21 at 1534  Med List Status: <None>   Medication Order Taking? Sig Documenting Provider Last Dose Status Informant  acetaminophen (TYLENOL) 500 MG tablet [id number] Yes Take 500 mg by mouth every 6 (six) hours as needed (pain).  [provider] Taking Active Self  amLODipine (NORVASC) 2.5 MG tablet [id number] Yes TAKE 1 TABLET BY MOUTH  DAILY Lyndon Code, MD Taking Active   clonazePAM (KLONOPIN) 0.5 MG tablet [id number] Yes TAKE 1 TABLET BY MOUTH  TWICE DAILY AS NEEDED FOR  ANXIETY Theotis Burrow, NP Taking Active   cloNIDine (CATAPRES) 0.1 MG tablet [id number] Yes Take 1 tablet (0.1 mg total) by mouth 2 (two) times daily. For blood pressure above 160 as needed only Lyndon Code, MD Taking Active   desipramine (NORPRAMIN) 25 MG tablet [id number] Yes TAKE 1 TABLET BY MOUTH  DAILY Theotis Burrow, NP Taking Active   Fluticasone-Salmeterol (ADVAIR) 250-50 MCG/DOSE AEPB [id number] Yes Inhale 1 puff into the lungs 2 (two) times daily. [provider] Taking Active Self  Homeopathic Products [facility] RELIEF) FOAM [id number] Yes Apply 2 application topically See admin  instructions. Apply two applications to various areas of the body at bedtime according to bottle instructions [provider] Taking Active Self           Med Note Truman Hayward   Fri Jan 29, 2020  6:32 PM) Medication at bedside   hydrALAZINE (APRESOLINE) 25 MG tablet [id number] Yes TAKE 1 TABLET BY MOUTH 3  TIMES DAILY Theotis Burrow, NP Taking Active   levothyroxine (SYNTHROID) 50 MCG tablet [id number] Yes Take 1 tablet (50 mcg total) by mouth daily. Lyndon Code, MD Taking Active            Med Note Erroll Luna   Tue Apr 11, 2021 11:01 AM) Taking half tablet  losartan (COZAAR) 50 MG tablet [id number] Yes TAKE 1 TABLET BY MOUTH  TWICE DAILY Theotis Burrow, NP Taking Active   Multiple Vitamins-Minerals (PRESERVISION AREDS 2+MULTI VIT) CAPS [id number] Yes Take 1 capsule by mouth in the morning and at bedtime. [provider] Taking Active Self  omeprazole (PRILOSEC) 20 MG capsule [id number] Yes Take 20 mg by mouth 2 (two) times daily before a meal.  [provider] Taking Active Self  Polyethylene Glycol 400 (BLINK TEARS) 0.25 % GEL [id number] Yes Place 1-2 drops into both eyes as needed (dry eyes). [provider] Taking Active Self           Med Note Truman Hayward   Fri Jan 29, 2020  6:31 PM) Medication at bedside  sennosides-docusate sodium (SENOKOT-S) 8.6-50 MG tablet [id number] Yes Take 1-2 tablets by mouth at bedtime.  [provider] Taking Active Self  terbinafine (LAMISIL) 1 % cream [id number] Yes Apply 1 application topically 2 (two) times daily as needed (toes).  [provider] Taking Active Self            Patient Active Problem List   Diagnosis Date Noted   Neutropenic fever (HCC) 01/29/2020   Hyponatremia 01/19/2020   COPD (chronic obstructive pulmonary disease) (HCC)    Anxiety    Gastroenteritis    Hypothyroidism    Right bundle branch block (RBBB) determined by electrocardiography 01/06/2020   Chronic  intractable headache 01/06/2020  H/O total hip arthroplasty 08/17/2019   Essential hypertension 06/24/2018   GERD (gastroesophageal reflux disease) 12/10/2014   History of IBS 12/10/2014    Immunization History  Administered Date(s) Administered   Influenza Inj Mdck Quad Pf 07/07/2019   Influenza, High Dose Seasonal PF 09/09/2017, 08/04/2018   PFIZER(Purple Top)SARS-COV-2 Vaccination 12/07/2019, 12/28/2019   Pneumococcal Polysaccharide-23 11/12/2018, 08/19/2019   Pneumococcal-Unspecified 11/12/2018    Conditions to be addressed/monitored:  HTN, COPD, GERD, Hypothyroidism, Anxiety  Care Plan : General Pharmacy (Adult)  Updates made by Edythe Clarity, RPH since 04/11/2021 12:00 AM     Problem: HTN, COPD, GERD, Hypothyroidism, Anxiety   Priority: High  Onset Date: 04/11/2021     Long-Range Goal: Patient-Specific Goal   Start Date: 04/11/2021  Expected End Date: 10/11/2021  This Visit's Progress: On track  Priority: High  Note:   Current Barriers:  Unable to independently monitor therapeutic efficacy  Pharmacist Clinical Goal(s):  Patient will achieve adherence to monitoring guidelines and medication adherence to achieve therapeutic efficacy adhere to plan to optimize therapeutic regimen for BP as evidenced by report of adherence to recommended medication management changes contact provider office for questions/concerns as evidenced notation of same in electronic health record through collaboration with PharmD and provider.   Interventions: 1:1 collaboration with Lavera Guise, MD regarding development and update of comprehensive plan of care as evidenced by provider attestation and co-signature Inter-disciplinary care team collaboration (see longitudinal plan of care) Comprehensive medication review performed; medication list updated in electronic medical record  Hypertension (BP goal <140/90) -Unsure of level of control, office BP's have been above goal -Current  treatment: Amlodipine 2.5mg  daily Clonidine 0.1mg  daily prn for systolic above 500 Hydralazine 25mg  tid Losartan 50mg  daily -Medications previously tried: metoprolol  -Current home readings: she does not check at home currently -Current dietary habits: well balanced diet, likes vegetables, has to watch which ones she eats because of stomach problems -Current exercise habits: in home workouts 4-5 times weekly lifting light weights or walking in place -Denies hypotensive/hypertensive symptoms -Educated on BP goals and benefits of medications for prevention of heart attack, stroke and kidney damage; Daily salt intake goal < 2300 mg; Importance of home blood pressure monitoring; Symptoms of hypotension and importance of maintaining adequate hydration; -She does not have a way to monitor her blood pressure at home currently.  She is supposed to be taking clonidine prn when systolic BP is above 370.  Currently she just takes it "when she can tell her BP is elevated" -I believe she is a great candidate for RPM device so that she can check and manage her BP at home, this will also communicate alerts to the office. -Recommended to continue current medication -Will set up with RPM blood pressure machine.  COPD (Goal: control symptoms and prevent exacerbations) -Controlled -Current treatment  Advair 250-50 1 puff twice daily -Medications previously tried: Singulair    -Pulmonary function testing: Pulmonary Functions Testing Results:  No results found for: FEV1, FVC, FEV1FVC, TLC, DLCO  -Exacerbations requiring treatment in last 6 months: none -Patient denies consistent use of maintenance inhaler, she takes it once daily but does not always use twice daily -Frequency of rescue inhaler use: N/A -Counseled on Benefits of consistent maintenance inhaler use -Recommended to continue current medication Recommended she use Advair twice daily as prescribed this may help with her coughing up  phlegm  Depression/Anxiety (Goal: Minimze symptoms) -Not ideally controlled -Current treatment: Desipramine 25mg  daily Clonazepam 0.5mg  bid prn -Medications previously tried/failed:  none noted -PHQ9:  [place] Office Visit from 02/18/2018 in [facility], [facility]  PHQ-9 Total Score 4     -GAD7: No flowsheet data found.  -Educated on Benefits of medication for symptom control -Stable mood, has to avoid television and news sometimes due to everything going on. -Recommended to continue current medication  GERD (Goal: Minimize symptoms) -Controlled -Current treatment  Omeprazole $RemoveBef'20mg'ChmGvvxLvO$  daily -Medications previously tried: none noted -Not having too many symptoms of acid reflux -Takes medication appropriately, working on limiting trigger foods -Recommended to continue current medication  Hypothyroidism (Goal: Maintain TSH) -Uncontrolled -Current treatment  Levothyroxine 9mcg daily - she is taking a half tablet daily -Medications previously tried: none noted -Last TSH was elevated, she is now taking half tablet daily. -Takes appropriately 30 minutes before food or other meds on empty stomach.  -Recommended to continue current medication Recommended recheck TSH at next OV.  Patient Goals/Self-Care Activities Patient will:  - take medications as prescribed focus on medication adherence by pill box check blood pressure daily, document, and provide at future appointments  Follow Up Plan: The care management team will reach out to the patient again over the next 90 days.        Medication Assistance: None required.  Patient affirms current coverage meets needs.  Compliance/Adherence/Medication fill history: Care Gaps: Shingrix  Star-Rating Drugs: Losaratn $RemoveBeforeD'50mg'ycZOYxiXVMCCRn$  90 ds 03/08/21  Patient's preferred pharmacy is:  [facility]  [facility] [place] [facility]) - [address], [place] [address] Phone:  [phone] Fax: [id number] [place], [place] - [address] [place] [address] Phone: [phone] Fax: [phone]  Uses pill box? Yes Pt endorses 100% compliance  We discussed: Benefits of medication synchronization, packaging and delivery as well as enhanced pharmacist oversight with [employer]. Patient decided to: Continue current medication management strategy  Care Plan and Follow Up Patient Decision:  Patient agrees to Care Plan and Follow-up.  Plan: The care management team will reach out to the patient again over the next 90 days.  Beverly Milch, PharmD Clinical Pharmacist [facility] [phone]

## 2021-04-05 ENCOUNTER — Telehealth: Payer: Self-pay

## 2021-04-05 NOTE — Telephone Encounter (Signed)
Informed pt that mammogram results were normal

## 2021-04-05 NOTE — Telephone Encounter (Signed)
-----   Message from Lavera Guise, MD sent at 04/05/2021 10:45 AM EDT ----- Regarding: RE: Mammogram results Nrm[place]  ----- Message ----- From: Edd Arbour, CMA Sent: 04/04/2021  12:40 PM EDT To: Lavera Guise, MD Subject: FW: Mammogram results                          Pt asking for results from mammogram, please review  ----- Message ----- From: Marshall Cork Sent: 04/04/2021  11:43 AM EDT To: Corlis Hove, [place], # Subject: Mammogram results                              Patient would like call back with mammogram results.

## 2021-04-11 ENCOUNTER — Ambulatory Visit: Payer: Medicare Other | Admitting: Pharmacist

## 2021-04-11 ENCOUNTER — Telehealth: Payer: Self-pay | Admitting: Pharmacist

## 2021-04-11 DIAGNOSIS — E039 Hypothyroidism, unspecified: Secondary | ICD-10-CM

## 2021-04-11 DIAGNOSIS — J449 Chronic obstructive pulmonary disease, unspecified: Secondary | ICD-10-CM

## 2021-04-11 DIAGNOSIS — F419 Anxiety disorder, unspecified: Secondary | ICD-10-CM

## 2021-04-11 DIAGNOSIS — I10 Essential (primary) hypertension: Secondary | ICD-10-CM

## 2021-04-11 NOTE — Patient Instructions (Signed)
Visit Information   Goals Addressed             This Visit's Progress    Track and Manage My Blood Pressure-Hypertension       Timeframe:  Long-Range Goal Priority:  High Start Date:  04/11/21                           Expected End Date: 10/11/21                      Follow Up Date 07/14/21    - check blood pressure 3 times per week - choose a place to take my blood pressure (home, clinic or office, retail store) - write blood pressure results in a log or diary    Why is this important?   You won't feel high blood pressure, but it can still hurt your blood vessels.  High blood pressure can cause heart or kidney problems. It can also cause a stroke.  Making lifestyle changes like losing a little weight or eating less salt will help.  Checking your blood pressure at home and at different times of the day can help to control blood pressure.  If the doctor prescribes medicine remember to take it the way the doctor ordered.  Call the office if you cannot afford the medicine or if there are questions about it.     Notes:         Patient Care Plan: General Pharmacy (Adult)     Problem Identified: HTN, COPD, GERD, Hypothyroidism, Anxiety   Priority: High  Onset Date: 04/11/2021     Long-Range Goal: Patient-Specific Goal   Start Date: 04/11/2021  Expected End Date: 10/11/2021  This Visit's Progress: On track  Priority: High  Note:   Current Barriers:  Unable to independently monitor therapeutic efficacy  Pharmacist Clinical Goal(s):  Patient will achieve adherence to monitoring guidelines and medication adherence to achieve therapeutic efficacy adhere to plan to optimize therapeutic regimen for BP as evidenced by report of adherence to recommended medication management changes contact provider office for questions/concerns as evidenced notation of same in electronic health record through collaboration with PharmD and provider.   Interventions: 1:1 collaboration with  Lavera Guise, MD regarding development and update of comprehensive plan of care as evidenced by provider attestation and co-signature Inter-disciplinary care team collaboration (see longitudinal plan of care) Comprehensive medication review performed; medication list updated in electronic medical record  Hypertension (BP goal <140/90) -Unsure of level of control, office BP's have been above goal -Current treatment: Amlodipine 2.5mg  daily Clonidine 0.1mg  daily prn for systolic above 858 Hydralazine 25mg  tid Losartan 50mg  daily -Medications previously tried: metoprolol  -Current home readings: she does not check at home currently -Current dietary habits: well balanced diet, likes vegetables, has to watch which ones she eats because of stomach problems -Current exercise habits: in home workouts 4-5 times weekly lifting light weights or walking in place -Denies hypotensive/hypertensive symptoms -Educated on BP goals and benefits of medications for prevention of heart attack, stroke and kidney damage; Daily salt intake goal < 2300 mg; Importance of home blood pressure monitoring; Symptoms of hypotension and importance of maintaining adequate hydration; -She does not have a way to monitor her blood pressure at home currently.  She is supposed to be taking clonidine prn when systolic BP is above 850.  Currently she just takes it "when she can tell her BP is elevated" -I believe  she is a great candidate for RPM device so that she can check and manage her BP at home, this will also communicate alerts to the office. -Recommended to continue current medication -Will set up with RPM blood pressure machine.  COPD (Goal: control symptoms and prevent exacerbations) -Controlled -Current treatment  Advair 250-50 1 puff twice daily -Medications previously tried: Singulair    -Pulmonary function testing: Pulmonary Functions Testing Results:  No results found for: FEV1, FVC, FEV1FVC, TLC,  DLCO  -Exacerbations requiring treatment in last 6 months: none -Patient denies consistent use of maintenance inhaler, she takes it once daily but does not always use twice daily -Frequency of rescue inhaler use: N/A -Counseled on Benefits of consistent maintenance inhaler use -Recommended to continue current medication Recommended she use Advair twice daily as prescribed this may help with her coughing up phlegm  Depression/Anxiety (Goal: Minimze symptoms) -Not ideally controlled -Current treatment: Desipramine 25mg  daily Clonazepam 0.5mg  bid prn -Medications previously tried/failed: none noted -PHQ9:  [place] Office [facility]  PHQ-9 Total Score 4     -GAD7: No flowsheet data found.  -Educated on Benefits of medication for symptom control -Stable mood, has to avoid television and news sometimes due to everything going on. -Recommended to continue current medication  GERD (Goal: Minimize symptoms) -Controlled -Current treatment  Omeprazole 20mg  daily -Medications previously tried: none noted -Not having too many symptoms of acid reflux -Takes medication appropriately, working on limiting trigger foods -Recommended to continue current medication  Hypothyroidism (Goal: Maintain TSH) -Uncontrolled -Current treatment  Levothyroxine 26mcg daily - she is taking a half tablet daily -Medications previously tried: none noted -Last TSH was elevated, she is now taking half tablet daily. -Takes appropriately 30 minutes before food or other meds on empty stomach.  -Recommended to continue current medication Recommended recheck TSH at next OV.  Patient Goals/Self-Care Activities Patient will:  - take medications as prescribed focus on medication adherence by pill box check blood pressure daily, document, and provide at future appointments  Follow Up Plan: The care management team will reach out to the patient again over the next 90  days.       Ms. Hayse was given information about Chronic Care Management services today including:  CCM service includes personalized support from designated clinical staff supervised by her physician, including individualized plan of care and coordination with other care providers 24/7 contact phone numbers for assistance for urgent and routine care needs. Standard insurance, coinsurance, copays and deductibles apply for chronic care management only during months in which we provide at least 20 minutes of these services. Most insurances cover these services at 100%, however patients may be responsible for any copay, coinsurance and/or deductible if applicable. This service may help you avoid the need for more expensive face-to-face services. Only one practitioner may furnish and bill the service in a calendar month. The patient may stop CCM services at any time (effective at the end of the month) by phone call to the office staff.  Patient agreed to services and verbal consent obtained.   The patient verbalized understanding of instructions, educational materials, and care plan provided today and agreed to receive a mailed copy of patient instructions, educational materials, and care plan.  Telephone follow up appointment with pharmacy team member scheduled for: 3 months  Edythe Clarity, [place]

## 2021-04-11 NOTE — Progress Notes (Addendum)
    Chronic Care Management Pharmacy Assistant   Name: Pam Cruz  MRN: [id number] DOB: Feb 24, 1935  Reason for Encounter: Initial RPM  Medications: Outpatient Encounter Medications as of 04/11/2021  Medication Sig Note   acetaminophen (TYLENOL) 500 MG tablet Take 500 mg by mouth every 6 (six) hours as needed (pain).     amLODipine (NORVASC) 2.5 MG tablet TAKE 1 TABLET BY MOUTH  DAILY    clonazePAM (KLONOPIN) 0.5 MG tablet TAKE 1 TABLET BY MOUTH  TWICE DAILY AS NEEDED FOR  ANXIETY    cloNIDine (CATAPRES) 0.1 MG tablet Take 1 tablet (0.1 mg total) by mouth 2 (two) times daily. For blood pressure above 160 as needed only    desipramine (NORPRAMIN) 25 MG tablet TAKE 1 TABLET BY MOUTH  DAILY    Fluticasone-Salmeterol (ADVAIR) 250-50 MCG/DOSE AEPB Inhale 1 puff into the lungs 2 (two) times daily.    Homeopathic Products ([place]) FOAM Apply 2 application topically See admin instructions. Apply two applications to various areas of the body at bedtime according to bottle instructions 01/29/2020: Medication at bedside    hydrALAZINE (APRESOLINE) 25 MG tablet TAKE 1 TABLET BY MOUTH 3  TIMES DAILY    levothyroxine (SYNTHROID) 50 MCG tablet Take 1 tablet (50 mcg total) by mouth daily. 04/11/2021: Taking half tablet   losartan (COZAAR) 50 MG tablet TAKE 1 TABLET BY MOUTH  TWICE DAILY    Multiple Vitamins-Minerals (PRESERVISION AREDS 2+MULTI VIT) CAPS Take 1 capsule by mouth in the morning and at bedtime.    omeprazole (PRILOSEC) 20 MG capsule Take 20 mg by mouth 2 (two) times daily before a meal.     Polyethylene Glycol 400 (BLINK TEARS) 0.25 % GEL Place 1-2 drops into both eyes as needed (dry eyes). 01/29/2020: Medication at bedside   sennosides-docusate sodium (SENOKOT-S) 8.6-50 MG tablet Take 1-2 tablets by mouth at bedtime.     terbinafine (LAMISIL) 1 % cream Apply 1 application topically 2 (two) times daily as needed (toes).     No facility-administered encounter medications on file as of  04/11/2021.   Spoke with the patient in regards of the RPM program for checking her blood pressure. We discussed the process and that she had to check her blood pressure at least 16 times a month.She voiced understandings and is looking forward to someone coming to her home to show her how to use the blood pressure machine.   Follow-Up:Pharmacist Review  Charlann Lange, [place] Pharmacist Assistant [phone]

## 2021-04-12 DIAGNOSIS — H00021 Hordeolum internum right upper eyelid: Secondary | ICD-10-CM | POA: Diagnosis not present

## 2021-04-13 ENCOUNTER — Telehealth: Payer: Self-pay | Admitting: Pharmacist

## 2021-04-13 DIAGNOSIS — I10 Essential (primary) hypertension: Secondary | ICD-10-CM | POA: Diagnosis not present

## 2021-04-13 DIAGNOSIS — K219 Gastro-esophageal reflux disease without esophagitis: Secondary | ICD-10-CM | POA: Diagnosis not present

## 2021-04-13 DIAGNOSIS — J449 Chronic obstructive pulmonary disease, unspecified: Secondary | ICD-10-CM | POA: Diagnosis not present

## 2021-04-13 NOTE — Progress Notes (Addendum)
    Chronic Care Management Pharmacy Assistant   Name: RONALDA WALPOLE  MRN: [id number] DOB: 25-Mar-1935   Reason for Encounter: Adherence Review  Reviewed the patients chart for any medical/health and/or medication changes there were not any changes at this time. Patients most recent blood pressure was 140/80.   Follow-Up:Pharmacist Review   Charlann Lange, [place] Pharmacist Assistant [phone]

## 2021-04-27 DIAGNOSIS — H353211 Exudative age-related macular degeneration, right eye, with active choroidal neovascularization: Secondary | ICD-10-CM | POA: Diagnosis not present

## 2021-05-04 ENCOUNTER — Telehealth: Payer: Self-pay | Admitting: Pharmacist

## 2021-05-04 NOTE — Progress Notes (Addendum)
    Chronic Care Management Pharmacy Assistant   Name: MOSETTA FERDINAND  MRN: [id number] DOB: 05-09-1935  Reason for Encounter: RPM Follow-Up.    Conditions to be addressed/monitored: HTN, COPD, GERD, Hypothyroidism, Anxiety  Recent office visits:  None since 04/13/21  Recent consult visits:  None since 04/13/21  Hospital visits:  None since 04/13/21  Medications: Outpatient Encounter Medications as of 05/04/2021  Medication Sig Note   acetaminophen (TYLENOL) 500 MG tablet Take 500 mg by mouth every 6 (six) hours as needed (pain).     amLODipine (NORVASC) 2.5 MG tablet TAKE 1 TABLET BY MOUTH  DAILY    clonazePAM (KLONOPIN) 0.5 MG tablet TAKE 1 TABLET BY MOUTH  TWICE DAILY AS NEEDED FOR  ANXIETY    cloNIDine (CATAPRES) 0.1 MG tablet Take 1 tablet (0.1 mg total) by mouth 2 (two) times daily. For blood pressure above 160 as needed only    desipramine (NORPRAMIN) 25 MG tablet TAKE 1 TABLET BY MOUTH  DAILY    Fluticasone-Salmeterol (ADVAIR) 250-50 MCG/DOSE AEPB Inhale 1 puff into the lungs 2 (two) times daily.    Homeopathic Products ([place]) FOAM Apply 2 application topically See admin instructions. Apply two applications to various areas of the body at bedtime according to bottle instructions 01/29/2020: Medication at bedside    hydrALAZINE (APRESOLINE) 25 MG tablet TAKE 1 TABLET BY MOUTH 3  TIMES DAILY    levothyroxine (SYNTHROID) 50 MCG tablet Take 1 tablet (50 mcg total) by mouth daily. 04/11/2021: Taking half tablet   losartan (COZAAR) 50 MG tablet TAKE 1 TABLET BY MOUTH  TWICE DAILY    Multiple Vitamins-Minerals (PRESERVISION AREDS 2+MULTI VIT) CAPS Take 1 capsule by mouth in the morning and at bedtime.    omeprazole (PRILOSEC) 20 MG capsule Take 20 mg by mouth 2 (two) times daily before a meal.     Polyethylene Glycol 400 (BLINK TEARS) 0.25 % GEL Place 1-2 drops into both eyes as needed (dry eyes). 01/29/2020: Medication at bedside   sennosides-docusate sodium (SENOKOT-S)  8.6-50 MG tablet Take 1-2 tablets by mouth at bedtime.     terbinafine (LAMISIL) 1 % cream Apply 1 application topically 2 (two) times daily as needed (toes).     No facility-administered encounter medications on file as of 05/04/2021.    RPM:  Spoke with the patient about her blood pressure levels. We dicussed the times she exercised and took her blood pressure. Also explained to her that other people can check their blood pressure just ensure that they place the blood pressure machine on #2. She voiced understanding. Overall the patients blood pressure levels are leveling out and are within normal range.   Star Rating Drugs: Losartan 50 mg 90 DS 03/08/21.   Follow-Up:Pharmacist Review   Charlann Lange, RMA Clinical Pharmacist Assistant [phone] 5 minutes spent in review, coordination, and documentation.  Reviewed by: Beverly Milch, PharmD Clinical Pharmacist [phone]

## 2021-05-05 ENCOUNTER — Telehealth: Payer: Medicare Other

## 2021-05-10 ENCOUNTER — Telehealth: Payer: Self-pay | Admitting: Pharmacist

## 2021-05-10 NOTE — Chronic Care Management (AMB) (Signed)
Chronic Care Management   Outreach Note  05/10/2021 Name: Pam Cruz MRN: [id number] DOB: 1935/10/03  Referred by: Lavera Guise, MD Reason for referral : Chronic Care Management (RPM monthly report)    [name]  Name: Pam Cruz  Date of Birth: 09-Sep-1935  Gender: Female  Primary provider: [name]  RPM Coordinator: Charlann Lange  ICD Code: I10 Device ID: [id number] [place]: iBP Measurement Type: blood pressure Days of Monitoring: 14 Documentation Time: 27 Min 0 Sec BLOOD PRESSURE  Measurement Date: 05/07/2021 Sunday at [id number] PM Systolic / Diastolic (mmHg): 123456 / 86 Measurement Date: 05/07/2021 Sunday at Q000111Q PM Systolic / Diastolic (mmHg): Q000111Q / 83 Measurement Date: 05/06/2021 Saturday at Q000111Q PM Systolic / Diastolic (mmHg): 99991111 / 99991111 Measurement Date: 05/06/2021 Saturday at Q000111Q PM Systolic / Diastolic (mmHg): 99991111 / 74 Measurement Date: 05/05/2021 Friday at 123XX123 PM Systolic / Diastolic (mmHg): 0000000 / 79 Measurement Date: 05/05/2021 Friday at 123XX123 PM Systolic / Diastolic (mmHg): 123456 / 64 Measurement Date: 05/04/2021 Thursday at 99991111 PM Systolic / Diastolic (mmHg): AB-123456789 / 85 Measurement Date: 05/04/2021 Thursday at Q000111Q PM Systolic / Diastolic (mmHg): 123456 / 84 Measurement Date: 05/03/2021 Wednesday at 99991111 PM Systolic / Diastolic (mmHg): 0000000 / 74 Measurement Date: 05/03/2021 Wednesday at Q000111Q PM Systolic / Diastolic (mmHg): 99991111 / 67 Measurement Date: 05/02/2021 Tuesday at Q000111Q PM Systolic / Diastolic (mmHg): A999333 / 78 Measurement Date: 05/02/2021 Tuesday at Q000111Q PM Systolic / Diastolic (mmHg): A999333 / 74 Measurement Date: 05/01/2021 Monday at Q000111Q PM Systolic / Diastolic (mmHg): Q000111Q / 75 Measurement Date: 05/01/2021 Monday at XX123456 PM Systolic / Diastolic (mmHg): 0000000 / 79 Measurement Date: 04/30/2021 Sunday at Q000111Q PM Systolic / Diastolic (mmHg): 123456 / 88 Measurement Date: 04/30/2021 Sunday at 99991111 PM Systolic / Diastolic (mmHg): 99991111 /  90 Measurement Date: 04/29/2021 Saturday at 0000000 PM Systolic / Diastolic (mmHg): A999333 / 68 Measurement Date: 04/29/2021 Saturday at 123456 PM Systolic / Diastolic (mmHg): Q000111Q / 87 Measurement Date: 04/25/2021 Tuesday at Q000111Q PM Systolic / Diastolic (mmHg): A999333 / 73 Measurement Date: 04/25/2021 Tuesday at Q000111Q AM Systolic / Diastolic (mmHg): 123456 / 99 Measurement Date: 04/24/2021 Monday at 123XX123 PM Systolic / Diastolic (mmHg): AB-123456789 / 70 Measurement Date: 04/24/2021 Monday at 123456 PM Systolic / Diastolic (mmHg): XX123456 / 86 Measurement Date: 04/23/2021 Sunday at 123XX123 PM Systolic / Diastolic (mmHg): Q000111Q / 90 Measurement Date: 04/23/2021 Sunday at 123XX123 PM Systolic / Diastolic (mmHg): A999333 / 82 Measurement Date: 04/22/2021 Saturday at 123XX123 PM Systolic / Diastolic (mmHg): 0000000 / 84 Measurement Date: 04/22/2021 Saturday at 0000000 PM Systolic / Diastolic (mmHg): 123456 / 87 Measurement Date: 04/22/2021 Saturday at 99991111 PM Systolic / Diastolic (mmHg): 123456 / 83 Measurement Date: 04/21/2021 Friday at A999333 PM Systolic / Diastolic (mmHg): 0000000 / 86 Measurement Date: 04/21/2021 Friday at 0000000 PM Systolic / Diastolic (mmHg): Q000111Q / 74 Measurement Date: 04/21/2021 Friday at XX123456 PM Systolic / Diastolic (mmHg): AB-123456789 / 76 Measurement Date: 04/21/2021 Friday at XX123456 PM Systolic / Diastolic (mmHg): Q000111Q / 82  PULSE  Measurement Date: 05/07/2021 Sunday at 11:19 PM Pulse (IN BPM): 55 Measurement Date: 05/07/2021 Sunday at 04:20 PM Pulse (IN BPM): 62 Measurement Date: 05/06/2021 Saturday at 11:39 PM Pulse (IN BPM): 68 Measurement Date: 05/06/2021 Saturday at 03:12 PM Pulse (IN BPM): 74 Measurement Date: 05/05/2021 Friday at 11:30 PM Pulse (IN BPM): 67 Measurement Date: 05/05/2021 Friday at 03:13 PM Pulse (IN BPM): 63 Measurement Date: 05/04/2021 Thursday at 11:36 PM Pulse (IN  BPM): 62 Measurement Date: 05/04/2021 Thursday at 02:32 PM Pulse (IN BPM): 60 Measurement Date: 05/03/2021 Wednesday at 11:36 PM Pulse (IN  BPM): 62 Measurement Date: 05/03/2021 Wednesday at 03:42 PM Pulse (IN BPM): 73 Measurement Date: 05/02/2021 Tuesday at 11:39 PM Pulse (IN BPM): 58 Measurement Date: 05/02/2021 Tuesday at 02:20 PM Pulse (IN BPM): 64 Measurement Date: 05/01/2021 Monday at 11:53 PM Pulse (IN BPM): 62 Measurement Date: 05/01/2021 Monday at 01:47 PM Pulse (IN BPM): 67 Measurement Date: 04/30/2021 Sunday at 11:23 PM Pulse (IN BPM): 61 Measurement Date: 04/30/2021 Sunday at 02:50 PM Pulse (IN BPM): 62 Measurement Date: 04/29/2021 Saturday at 11:43 PM Pulse (IN BPM): 65 Measurement Date: 04/29/2021 Saturday at 04:54 PM Pulse (IN BPM): 71 Measurement Date: 04/25/2021 Tuesday at 10:44 PM Pulse (IN BPM): 65 Measurement Date: 04/25/2021 Tuesday at 11:53 AM Pulse (IN BPM): 78 Measurement Date: 04/24/2021 Monday at 11:37 PM Pulse (IN BPM): 66 Measurement Date: 04/24/2021 Monday at 03:00 PM Pulse (IN BPM): 66 Measurement Date: 04/23/2021 Sunday at 09:28 PM Pulse (IN BPM): 65 Measurement Date: 04/23/2021 Sunday at 04:11 PM Pulse (IN BPM): 71 Measurement Date: 04/22/2021 Saturday at 08:50 PM Pulse (IN BPM): 71 Measurement Date: 04/22/2021 Saturday at 08:38 PM Pulse (IN BPM): 74 Measurement Date: 04/22/2021 Saturday at 02:07 PM Pulse (IN BPM): 73 Measurement Date: 04/21/2021 Friday at 01:39 PM Pulse (IN BPM): 74 Measurement Date: 04/21/2021 Friday at 01:36 PM Pulse (IN BPM): 75 Measurement Date: 04/21/2021 Friday at 01:30 PM Pulse (IN BPM): 75 Measurement Date: 04/21/2021 Friday at 01:27 PM Pulse (IN BPM): 72        Notes for July-2022 : 34 Min 0 Sec  Date and Time: 05/10/2021 Wednesday at 12:08 PM User: Leata Mouse Time logged: 10:00 Notes: DATA REVIEW SYSTOLIC BP  Automatically transmitted data is reviewed today from device. (ID: [id number]).  Systolic BP reading is XX123456 for 3.23 % of time  Systolic BP reading is between 160 to 179 for 3.23 % of time  Systolic BP reading is between 140 to 159 for  38.71 % of time  Systolic BP reading is between 120-139 for 41.94 % of time  Systolic BP reading is between 101-120 for 12.9 % of time  Systolic BP reading is 99991111 for 0 % of time  DIASTOLIC BP  Diastolic BP reading is elevated >100 for 3.23 % of time  Diastolic BP reading is between 90-99 for 9.68 % of time  PULSE  Pulse is >120 for 0 % of time  Pulse is <50 for 0 % of time  COMPLIANCE NOTES BLOOD PRESSURE  Patient is not compliant - 16 days of readings not obtained.  REVIEW NOTES Heart rate/Pulse data reviewed  BP numbers are stable except some elevations  MANAGEMENT NOTES We will continue to monitor BP readings for now  Medications for BP reviewed for this patient  No change on the treatment plan   Date and Time: 05/08/2021 Monday at 02:48 PM User: Leata Mouse Time logged: 10:00 Notes: MANAGEMENT NOTES Contacted patient regarding critical alerts, patient called EMS services and EKG showed no signs of heart issues.  We will continue to monitor BP readings for now  Medications for BP reviewed for this patient  Advised patient to use the device daily   Date and Time: 05/05/2021 Friday at 03:34 PM User: Charlann Lange Time logged: 02:00 Notes: COMPLIANCE NOTES BLOOD PRESSURE  Patient is compliant - 16 days of readings not obtained.  REVIEW NOTES All the daily Blood pressure data noted since last  review  Heart rate/Pulse data reviewed   Date and Time: 05/04/2021 Thursday at 02:58 PM User: Charlann Lange Time logged: 02:00 Notes: REVIEW NOTES All the daily Blood pressure data noted since last review  Heart rate/Pulse data reviewed   Date and Time: 05/03/2021 Wednesday at 03:53 PM User: Charlann Lange Time logged: 02:00 Notes: REVIEW NOTES All the daily Blood pressure data noted since last review  Heart rate/Pulse data reviewed   Date and Time: 05/02/2021 Tuesday at 04:20 PM User: Charlann Lange Time logged:  02:00 Notes: REVIEW NOTES All the daily Blood pressure data noted since last review  Heart rate/Pulse data reviewed   Date and Time: 05/01/2021 Monday at 04:03 PM User: Charlann Lange Time logged: 02:00 Notes: REVIEW NOTES All the daily Blood pressure data noted since last review  Heart rate/Pulse data reviewed   Date and Time: 04/25/2021 Tuesday at 03:33 PM User: Charlann Lange Time logged: 02:00 Notes: REVIEW NOTES All the daily Blood pressure data noted since last review  Heart rate/Pulse data reviewed   Date and Time: 04/24/2021 Monday at 03:40 PM User: Charlann Lange Time logged: 02:00 Notes: REVIEW NOTES All the daily Blood pressure data noted since last review  Heart rate/Pulse data reviewed   Date and Time: 04/21/2021 Friday at 01:49 PM User: Franki Cabot Time logged: 00:00 Notes: On 04/21/2021, patient was newly enrolled in RPM. I instructed the patient how to use her device. The patient was told to use the device 16 days a month, The patient was instructed to take her BP every morning two hours after her medication time, if the patient's BP is elevated the patient is instructed to take her BP again that evening. The patient is instructed to drink plenty of water and increase her steps . Patient was instructed not take her BP near appliances, to sit up in chair with a back to it, feet flat on the floor, and no talking while taking her BP. The patient is not to take the cuff off until the device says OK. The patient will replace the batteries when needed. The patient is always to relax 5 mins before taking BP. The team will monitor the patient, and notify the PCP of any critical alerts. The patient understood all the instructions. manual B/P taken in home 138/80. Time spent in home 52mn.   Date and Time: 04/21/2021 Friday at 09:18 PM User: KFranki CabotTime logged: 00:00 Notes: Spoke to Patient stated that she would like delivery 04/21/21 at 1pm she  needed to go out shopping and would not be home.  Verbal Consent Obtained on April 12, 2021. Patient was notified about Remote Patient Monitoring (RPM) program offered by our medical practice to improve the patient care, co-ordination of care, patient education, provide quality of care, reduce the cost and provide personalized care. Patient was informed that this is Medicare approved program and can be furnished by only one provider, can be stopped by the patient anytime. Patient has verbally consented to enroll in this program and our designated care coordinator to contact by phone as needed at least for 20 minutes a month. Practice also provides 24/7 care to the patient needs. All patient questions were answered and patient was enrolled in the RPM program.

## 2021-05-11 ENCOUNTER — Other Ambulatory Visit: Payer: Self-pay

## 2021-05-11 ENCOUNTER — Encounter: Payer: Self-pay | Admitting: Internal Medicine

## 2021-05-11 ENCOUNTER — Ambulatory Visit (INDEPENDENT_AMBULATORY_CARE_PROVIDER_SITE_OTHER): Payer: Medicare Other | Admitting: Internal Medicine

## 2021-05-11 VITALS — BP 140/86 | HR 67 | Temp 98.6°F | Resp 16 | Ht <= 58 in | Wt 78.2 lb

## 2021-05-11 DIAGNOSIS — K588 Other irritable bowel syndrome: Secondary | ICD-10-CM

## 2021-05-11 DIAGNOSIS — I10 Essential (primary) hypertension: Secondary | ICD-10-CM

## 2021-05-11 DIAGNOSIS — K219 Gastro-esophageal reflux disease without esophagitis: Secondary | ICD-10-CM

## 2021-05-11 DIAGNOSIS — F411 Generalized anxiety disorder: Secondary | ICD-10-CM

## 2021-05-11 MED ORDER — CLONIDINE HCL 0.1 MG PO TABS: 0.1000 mg | ORAL_TABLET | Freq: Two times a day (BID) | ORAL | 1 refills | Status: DC

## 2021-05-11 MED ORDER — CLONAZEPAM 0.5 MG PO TABS: 0.5000 mg | ORAL_TABLET | Freq: Two times a day (BID) | ORAL | 0 refills | Status: DC | PRN

## 2021-05-11 NOTE — Progress Notes (Signed)
[facility] [place], [place] [address]  Internal MEDICINE  Office Visit Note  Patient Name: Pam Cruz  [id number]  [id number]  Date of Service: 05/21/2021  Chief Complaint  Patient presents with   Follow-up    Chest pain,seen by EMT on 7/23//2022,      HPI Pt is seen for follow up She called EMT due to having chest pain, She brought her EKG and vitals, BP was slightly elevated she thinks it was her stomach because of IBS.  She ate all different types of "junk "food that day  Symptoms resolved on its own, she will schedule her app with GI  Needs refills on her routine medications  BP is looks good today    Current Medication: Outpatient Encounter Medications as of 05/11/2021  Medication Sig Note   acetaminophen (TYLENOL) 500 MG tablet Take 500 mg by mouth every 6 (six) hours as needed (pain).     amLODipine (NORVASC) 2.5 MG tablet TAKE 1 TABLET BY MOUTH  DAILY    bimatoprost (LUMIGAN) 0.01 % SOLN INSTILL 1 DROP IN LEFT EYE AT BEDTIME    desipramine (NORPRAMIN) 25 MG tablet TAKE 1 TABLET BY MOUTH  DAILY    Fluticasone-Salmeterol (ADVAIR) 250-50 MCG/DOSE AEPB Inhale 1 puff into the lungs 2 (two) times daily.    Homeopathic Products ([place]) FOAM Apply 2 application topically See admin instructions. Apply two applications to various areas of the body at bedtime according to bottle instructions 01/29/2020: Medication at bedside    hydrALAZINE (APRESOLINE) 25 MG tablet TAKE 1 TABLET BY MOUTH 3  TIMES DAILY    levothyroxine (SYNTHROID) 50 MCG tablet Take 1 tablet (50 mcg total) by mouth daily. 04/11/2021: Taking half tablet   losartan (COZAAR) 50 MG tablet TAKE 1 TABLET BY MOUTH  TWICE DAILY    Multiple Vitamins-Minerals (PRESERVISION AREDS 2+MULTI VIT) CAPS Take 1 capsule by mouth in the morning and at bedtime.    omeprazole (PRILOSEC) 20 MG capsule Take 20 mg by mouth 2 (two) times daily before a meal.     Polyethylene Glycol 400 (BLINK TEARS)  0.25 % GEL Place 1-2 drops into both eyes as needed (dry eyes). 01/29/2020: Medication at bedside   Polyethylene Glycol 400 (BLINK TEARS) 0.25 % SOLN Apply to eye.    sennosides-docusate sodium (SENOKOT-S) 8.6-50 MG tablet Take 1-2 tablets by mouth at bedtime.     [DISCONTINUED] clonazePAM (KLONOPIN) 0.5 MG tablet TAKE 1 TABLET BY MOUTH  TWICE DAILY AS NEEDED FOR  ANXIETY    [DISCONTINUED] cloNIDine (CATAPRES) 0.1 MG tablet Take 1 tablet (0.1 mg total) by mouth 2 (two) times daily. For blood pressure above 160 as needed only    clonazePAM (KLONOPIN) 0.5 MG tablet Take 1 tablet (0.5 mg total) by mouth 2 (two) times daily as needed. for anxiety    cloNIDine (CATAPRES) 0.1 MG tablet Take 1 tablet (0.1 mg total) by mouth 2 (two) times daily. May take extra if needed    terbinafine (LAMISIL) 1 % cream Apply 1 application topically 2 (two) times daily as needed (toes).  (Patient not taking: Reported on 05/11/2021)    No facility-administered encounter medications on file as of 05/11/2021.    Surgical History: Past Surgical History:  Procedure Laterality Date   COLONOSCOPY     cysto     LAPAROSCOPY     TOTAL HIP ARTHROPLASTY Right 08/17/2019   Procedure: TOTAL HIP ARTHROPLASTY;  Surgeon: Dereck Leep, MD;  Location: [facility] ORS;  Service: Orthopedics;  Laterality: Right;    Medical History: Past Medical History:  Diagnosis Date   Anxiety    Arthritis    COPD (chronic obstructive pulmonary disease) (HCC)    Dyspnea    Gastritis    GERD (gastroesophageal reflux disease)    Hypertension    Osteoporosis     Family History: Family History  Problem Relation Age of Onset   Hypertension Mother    Coronary artery disease Father    Heart disease Father     Social History   Socioeconomic History   Marital status: Single    Spouse name: Not on file   Number of children: Not on file   Years of education: Not on file   Highest education level: Not on file  Occupational History   Not on  file  Tobacco Use   Smoking status: Former    Types: Cigarettes    Quit date: 02/20/2001    Years since quitting: 20.2   Smokeless tobacco: Never   Tobacco comments:    quit 17 years ago  Vaping Use   Vaping Use: Never used  Substance and Sexual Activity   Alcohol use: Yes    Alcohol/week: 2.0 standard drinks    Types: 2 Glasses of wine per week    Comment: very rarely   Drug use: Never   Sexual activity: Not on file  Other Topics Concern   Not on file  Social History Narrative   Not on file   Social Determinants of Health   Financial Resource Strain: Low Risk    Difficulty of Paying Living Expenses: Not very hard  Food Insecurity: Not on file  Transportation Needs: Not on file  Physical Activity: Not on file  Stress: Not on file  Social Connections: Not on file  Intimate Partner Violence: Not on file      Review of Systems  Constitutional:  Negative for fatigue and fever.  HENT:  Negative for congestion, mouth sores and postnasal drip.   Respiratory:  Negative for cough.   Cardiovascular:  Negative for chest pain.  Genitourinary:  Negative for flank pain.  Psychiatric/Behavioral: Negative.     Vital Signs: BP 140/86 Comment: 150/86  Pulse 67   Temp 98.6 F (37 C)   Resp 16   Ht '4\' 8"'$  (1.422 m)   Wt 78 lb 3.2 oz (35.5 kg)   SpO2 97%   BMI 17.53 kg/m    Physical Exam Constitutional:      Appearance: Normal appearance.  HENT:     Head: Normocephalic and atraumatic.     Nose: Nose normal.     Mouth/Throat:     Mouth: Mucous membranes are moist.     Pharynx: No posterior oropharyngeal erythema.  Eyes:     Extraocular Movements: Extraocular movements intact.     Pupils: Pupils are equal, round, and reactive to light.  Cardiovascular:     Pulses: Normal pulses.     Heart sounds: Normal heart sounds.  Pulmonary:     Effort: Pulmonary effort is normal.     Breath sounds: Normal breath sounds.  Neurological:     General: No focal deficit present.      Mental Status: She is alert.  Psychiatric:        Mood and Affect: Mood normal.        Behavior: Behavior normal.     Assessment/Plan: 1. Benign hypertension Pt has resistant hypertension, on multiple medication, does not clonidine for episodes of extreme  spike in her blood pressure. She is very sensitive to diuretics due to hyponatremia  - cloNIDine (CATAPRES) 0.1 MG tablet; Take 1 tablet (0.1 mg total) by mouth 2 (two) times daily. May take extra if needed  Dispense: 240 tablet; Refill: 1  2. Other irritable bowel syndrome Continue all medications, follow up GI   3. Gastroesophageal reflux disease without esophagitis Stable at the mment   4. GAD (generalized anxiety disorder) Continue as before  - clonazePAM (KLONOPIN) 0.5 MG tablet; Take 1 tablet (0.5 mg total) by mouth 2 (two) times daily as needed. for anxiety  Dispense: 180 tablet; Refill: 0   General Counseling: nekaybaw karter understanding of the findings of todays visit and agrees with plan of treatment. I have discussed any further diagnostic evaluation that may be needed or ordered today. We also reviewed her medications today. she has been encouraged to call the office with any questions or concerns that should arise related to todays visit.    Meds ordered this encounter  Medications   clonazePAM (KLONOPIN) 0.5 MG tablet    Sig: Take 1 tablet (0.5 mg total) by mouth 2 (two) times daily as needed. for anxiety    Dispense:  180 tablet    Refill:  0   cloNIDine (CATAPRES) 0.1 MG tablet    Sig: Take 1 tablet (0.1 mg total) by mouth 2 (two) times daily. May take extra if needed    Dispense:  240 tablet    Refill:  1    Total time spent:45 Minutes Time spent includes review of chart, medications, test results, and follow up plan with the patient.   [place] Controlled Substance Database was reviewed by me.   Dr Lavera Guise Internal medicine

## 2021-05-14 DIAGNOSIS — K219 Gastro-esophageal reflux disease without esophagitis: Secondary | ICD-10-CM | POA: Diagnosis not present

## 2021-05-14 DIAGNOSIS — J449 Chronic obstructive pulmonary disease, unspecified: Secondary | ICD-10-CM | POA: Diagnosis not present

## 2021-05-14 DIAGNOSIS — I10 Essential (primary) hypertension: Secondary | ICD-10-CM | POA: Diagnosis not present

## 2021-05-23 ENCOUNTER — Other Ambulatory Visit: Payer: Self-pay

## 2021-05-23 MED ORDER — FLUTICASONE-SALMETEROL 250-50 MCG/ACT IN AEPB: 1.0000 | INHALATION_SPRAY | Freq: Two times a day (BID) | RESPIRATORY_TRACT | 3 refills | Status: DC

## 2021-06-02 ENCOUNTER — Telehealth: Payer: Self-pay

## 2021-06-02 ENCOUNTER — Other Ambulatory Visit: Payer: Self-pay

## 2021-06-02 DIAGNOSIS — I10 Essential (primary) hypertension: Secondary | ICD-10-CM

## 2021-06-02 MED ORDER — CLONIDINE HCL 0.1 MG PO TABS: 0.1000 mg | ORAL_TABLET | Freq: Two times a day (BID) | ORAL | 1 refills | Status: DC

## 2021-06-02 NOTE — Telephone Encounter (Signed)
Patient called stating she needs Clonidine refill sent into [facility]

## 2021-06-13 DIAGNOSIS — K219 Gastro-esophageal reflux disease without esophagitis: Secondary | ICD-10-CM | POA: Diagnosis not present

## 2021-06-13 DIAGNOSIS — R634 Abnormal weight loss: Secondary | ICD-10-CM | POA: Diagnosis not present

## 2021-06-13 DIAGNOSIS — R1903 Right lower quadrant abdominal swelling, mass and lump: Secondary | ICD-10-CM | POA: Diagnosis not present

## 2021-06-13 DIAGNOSIS — K582 Mixed irritable bowel syndrome: Secondary | ICD-10-CM | POA: Diagnosis not present

## 2021-06-13 DIAGNOSIS — R1314 Dysphagia, pharyngoesophageal phase: Secondary | ICD-10-CM | POA: Diagnosis not present

## 2021-06-14 ENCOUNTER — Telehealth: Payer: Self-pay | Admitting: Pharmacist

## 2021-06-14 DIAGNOSIS — K219 Gastro-esophageal reflux disease without esophagitis: Secondary | ICD-10-CM | POA: Diagnosis not present

## 2021-06-14 DIAGNOSIS — I10 Essential (primary) hypertension: Secondary | ICD-10-CM | POA: Diagnosis not present

## 2021-06-14 DIAGNOSIS — J449 Chronic obstructive pulmonary disease, unspecified: Secondary | ICD-10-CM | POA: Diagnosis not present

## 2021-06-14 NOTE — Chronic Care Management (AMB) (Signed)
Chronic Care Management   Outreach Note  06/14/2021 Name: Pam Cruz MRN: [id number] DOB: 10/02/1935  Referred by: Lavera Guise, MD Reason for referral : No chief complaint on file.     [place]  Name: Pam Cruz  Date of Birth: 07/17/1935  Gender: Female  Primary provider: [name]  RPM Coordinator: Charlann Lange  ICD Code: I10 Device ID: [id number] [place]: iBP Measurement Type: blood pressure Days of Monitoring: 15 Documentation Time: 109 Min 0 Sec BLOOD PRESSURE  Measurement Date: 06/11/2021 Sunday at 123456 PM Systolic / Diastolic (mmHg): 123456 / 84 Measurement Date: 06/11/2021 Sunday at 0000000 PM Systolic / Diastolic (mmHg): Q000111Q / 90 Measurement Date: 06/10/2021 Saturday at Q000111Q PM Systolic / Diastolic (mmHg): 0000000 / 79 Measurement Date: 06/09/2021 Friday at 123XX123 PM Systolic / Diastolic (mmHg): 123456 / 67 Measurement Date: 06/09/2021 Friday at [id number] PM Systolic / Diastolic (mmHg): Q000111Q / 85 Measurement Date: 06/06/2021 Tuesday at XX123456 PM Systolic / Diastolic (mmHg): 0000000 / 82 Measurement Date: 06/06/2021 Tuesday at 0000000 PM Systolic / Diastolic (mmHg): 0000000 / 88 Measurement Date: 06/04/2021 Sunday at 0000000 PM Systolic / Diastolic (mmHg): 123XX123 / 71 Measurement Date: 06/04/2021 Sunday at 0000000 PM Systolic / Diastolic (mmHg): XX123456 / 83 Measurement Date: 06/02/2021 Friday at [id number] PM Systolic / Diastolic (mmHg): XX123456 / 75 Measurement Date: 06/02/2021 Friday at 0000000 PM Systolic / Diastolic (mmHg): 123456 / 77 Measurement Date: 06/01/2021 Thursday at [id number] PM Systolic / Diastolic (mmHg): XX123456 / 76 Measurement Date: 05/30/2021 Tuesday at [id number] PM Systolic / Diastolic (mmHg): 0000000 / 81 Measurement Date: 05/30/2021 Tuesday at 123456 PM Systolic / Diastolic (mmHg): 0000000 / 81 Measurement Date: 05/28/2021 Sunday at [id number] PM Systolic / Diastolic (mmHg): A999333 / 89 Measurement Date: 05/28/2021 Sunday at [id number] PM Systolic / Diastolic (mmHg): A999333 / 80 Measurement Date:  05/26/2021 Friday at [id number] PM Systolic / Diastolic (mmHg): A999333 / 79 Measurement Date: 05/26/2021 Friday at Q000111Q PM Systolic / Diastolic (mmHg): 123456 / 80 Measurement Date: 05/24/2021 Wednesday at Q000111Q PM Systolic / Diastolic (mmHg): [id number] / 74 Measurement Date: 05/24/2021 Wednesday at 0000000 PM Systolic / Diastolic (mmHg): [id number] / 72 Measurement Date: 05/22/2021 Monday at 0000000 PM Systolic / Diastolic (mmHg): XX123456 / 95 Measurement Date: 05/22/2021 Monday at XX123456 PM Systolic / Diastolic (mmHg): [id number] / 78 Measurement Date: 05/20/2021 Saturday at 123456 PM Systolic / Diastolic (mmHg): XX123456 / 84 Measurement Date: 05/20/2021 Saturday at 0000000 PM Systolic / Diastolic (mmHg): 0000000 / 86 Measurement Date: 05/18/2021 Thursday at 0000000 PM Systolic / Diastolic (mmHg): 99991111 / 73 Measurement Date: 05/18/2021 Thursday at Q000111Q PM Systolic / Diastolic (mmHg): 0000000 / 77 Measurement Date: 05/16/2021 Tuesday at [id number] PM Systolic / Diastolic (mmHg): Q000111Q / 76 Measurement Date: 05/16/2021 Tuesday at 123XX123 PM Systolic / Diastolic (mmHg): 0000000 / 73  PULSE  Measurement Date: 06/11/2021 Sunday at 11:49 PM Pulse (IN BPM): 70 Measurement Date: 06/11/2021 Sunday at 04:12 PM Pulse (IN BPM): 62 Measurement Date: 06/10/2021 Saturday at 04:09 PM Pulse (IN BPM): 73 Measurement Date: 06/09/2021 Friday at 11:37 PM Pulse (IN BPM): 63 Measurement Date: 06/09/2021 Friday at 02:57 PM Pulse (IN BPM): 69 Measurement Date: 06/06/2021 Tuesday at 11:40 PM Pulse (IN BPM): 61 Measurement Date: 06/06/2021 Tuesday at 04:47 PM Pulse (IN BPM): 65 Measurement Date: 06/04/2021 Sunday at 11:07 PM Pulse (IN BPM): 62 Measurement Date: 06/04/2021 Sunday at 01:59 PM Pulse (IN BPM): 74 Measurement Date: 06/02/2021 Friday at 11:26 PM Pulse (IN BPM): 56 Measurement Date: 06/02/2021 Friday at 03:05 PM  Pulse (IN BPM): 63 Measurement Date: 06/01/2021 Thursday at 02:51 PM Pulse (IN BPM): 69 Measurement Date: 05/30/2021 Tuesday at 10:40 PM Pulse (IN BPM):  64 Measurement Date: 05/30/2021 Tuesday at 03:22 PM Pulse (IN BPM): 73 Measurement Date: 05/28/2021 Sunday at 11:47 PM Pulse (IN BPM): 65 Measurement Date: 05/28/2021 Sunday at 03:28 PM Pulse (IN BPM): 68 Measurement Date: 05/26/2021 Friday at 11:34 PM Pulse (IN BPM): 60 Measurement Date: 05/26/2021 Friday at 04:16 PM Pulse (IN BPM): 67 Measurement Date: 05/24/2021 Wednesday at 11:53 PM Pulse (IN BPM): 57 Measurement Date: 05/24/2021 Wednesday at 02:40 PM Pulse (IN BPM): 66 Measurement Date: 05/22/2021 Monday at 11:43 PM Pulse (IN BPM): 72 Measurement Date: 05/22/2021 Monday at 03:19 PM Pulse (IN BPM): 75 Measurement Date: 05/20/2021 Saturday at 11:49 PM Pulse (IN BPM): 62 Measurement Date: 05/20/2021 Saturday at 04:42 PM Pulse (IN BPM): 60 Measurement Date: 05/18/2021 Thursday at 11:32 PM Pulse (IN BPM): 64 Measurement Date: 05/18/2021 Thursday at 02:20 PM Pulse (IN BPM): 62 Measurement Date: 05/16/2021 Tuesday at 10:18 PM Pulse (IN BPM): 62 Measurement Date: 05/16/2021 Tuesday at 02:56 PM Pulse (IN BPM): 65        Notes for August-2022 : 14 Min 0 Sec  Date and Time: 06/14/2021 Wednesday at 03:51 PM User: Leata Mouse Time logged: 05:00 Notes: DATA REVIEW SYSTOLIC BP  Automatically transmitted data is reviewed today from device. (ID: [id number]).  Systolic BP reading is XX123456 for 0 % of time  Systolic BP reading is between 160 to 179 for 7.14 % of time  Systolic BP reading is between 140 to 159 for 28.57 % of time  Systolic BP reading is between 120-139 for 53.57 % of time  Systolic BP reading is between 101-120 for 14.29 % of time  Systolic BP reading is [id number] for 0 % of time  DIASTOLIC BP  Diastolic BP reading is elevated >100 for 0 % of time  Diastolic BP reading is between 90-99 for 7.14 % of time  PULSE  Pulse is >120 for 0 % of time  Pulse is <50 for 0 % of time  COMPLIANCE NOTES BLOOD PRESSURE  Patient is not compliant - 16 days of readings not  obtained.  MANAGEMENT NOTES We will continue to monitor BP readings for now  Medications for BP reviewed for this patient  No change on the treatment plan   Date and Time: 06/13/2021 Tuesday at 10:34 AM User: Charlann Lange Time logged: 14:00 Notes: REVIEW NOTES All the daily Blood pressure data noted since last review  Heart rate/Pulse data reviewed   Date and Time: 06/05/2021 Monday at 04:31 PM User: Charlann Lange Time logged: 04:00 Notes: REVIEW NOTES All the daily Blood pressure data noted since last review  Heart rate/Pulse data reviewed   Date and Time: 06/02/2021 Friday at 04:33 PM User: Charlann Lange Time logged: 02:00 Notes: REVIEW NOTES All the daily Blood pressure data noted since last review  Heart rate/Pulse data reviewed   Date and Time: 06/01/2021 Thursday at 04:02 PM User: Charlann Lange Time logged: 02:00 Notes: REVIEW NOTES All the daily Blood pressure data noted since last review  Heart rate/Pulse data reviewed   Date and Time: 05/31/2021 Wednesday at 04:03 PM User: Charlann Lange Time logged: 02:00 Notes: REVIEW NOTES All the daily Blood pressure data noted since last review  Heart rate/Pulse data reviewed   Date and Time: 05/30/2021 Tuesday at 04:33 PM User: Charlann Lange Time logged: 02:00 Notes: REVIEW NOTES All the daily Blood pressure data noted  since last review  Heart rate/Pulse data reviewed   Date and Time: 05/29/2021 Monday at 04:20 PM User: Charlann Lange Time logged: 04:00 Notes: REVIEW NOTES All the daily Blood pressure data noted since last review  Heart rate/Pulse data reviewed   Date and Time: 05/24/2021 Wednesday at 04:09 PM User: Leata Mouse Time logged: 05:00 Notes: MANAGEMENT NOTES We will continue to monitor BP readings for now  Communicated with the provider regarding critical alerts  Advised patient to use the device daily   Date and Time: 05/24/2021 Wednesday  at 04:00 PM User: Charlann Lange Time logged: 02:00 Notes: REVIEW NOTES All the daily Blood pressure data noted since last review  Heart rate/Pulse data reviewed   Date and Time: 05/22/2021 Monday at 02:53 PM User: Charlann Lange Time logged: 02:00 Notes: REVIEW NOTES All the daily Blood pressure data noted since last review  Heart rate/Pulse data reviewed   Date and Time: 05/18/2021 Thursday at 08:17 AM User: Charlann Lange Time logged: 04:00 Notes: REVIEW NOTES All the daily Blood pressure data noted since last review  Heart rate/Pulse data reviewed   Date and Time: 05/15/2021 Monday at 03:32 PM User: Charlann Lange Time logged: 08:00 Notes: REVIEW NOTES All the daily Blood pressure data noted since last review  Heart rate/Pulse data reviewed  Verbal Consent Obtained on April 12, 2021. Patient was notified about Remote Patient Monitoring (RPM) program offered by our medical practice to improve the patient care, co-ordination of care, patient education, provide quality of care, reduce the cost and provide personalized care. Patient was informed that this is Medicare approved program and can be furnished by only one provider, can be stopped by the patient anytime. Patient has verbally consented to enroll in this program and our designated care coordinator to contact by phone as needed at least for 20 minutes a month. Practice also provides 24/7 care to the patient needs. All patient questions were answered and patient was enrolled in the RPM program.

## 2021-06-16 DIAGNOSIS — H40003 Preglaucoma, unspecified, bilateral: Secondary | ICD-10-CM | POA: Diagnosis not present

## 2021-06-20 ENCOUNTER — Other Ambulatory Visit: Payer: Self-pay

## 2021-06-20 MED ORDER — DESIPRAMINE HCL 25 MG PO TABS: 25.0000 mg | ORAL_TABLET | Freq: Every day | ORAL | 3 refills | Status: DC

## 2021-06-21 ENCOUNTER — Other Ambulatory Visit: Payer: Self-pay

## 2021-06-21 MED ORDER — LEVOTHYROXINE SODIUM 50 MCG PO TABS: 50.0000 ug | ORAL_TABLET | Freq: Every day | ORAL | 3 refills | Status: DC

## 2021-06-28 ENCOUNTER — Other Ambulatory Visit (HOSPITAL_COMMUNITY): Payer: Self-pay | Admitting: Gastroenterology

## 2021-06-28 ENCOUNTER — Other Ambulatory Visit: Payer: Self-pay | Admitting: Gastroenterology

## 2021-06-28 DIAGNOSIS — M79675 Pain in left toe(s): Secondary | ICD-10-CM | POA: Diagnosis not present

## 2021-06-28 DIAGNOSIS — M79674 Pain in right toe(s): Secondary | ICD-10-CM | POA: Diagnosis not present

## 2021-06-28 DIAGNOSIS — R1314 Dysphagia, pharyngoesophageal phase: Secondary | ICD-10-CM

## 2021-06-28 DIAGNOSIS — K219 Gastro-esophageal reflux disease without esophagitis: Secondary | ICD-10-CM

## 2021-06-28 DIAGNOSIS — B351 Tinea unguium: Secondary | ICD-10-CM | POA: Diagnosis not present

## 2021-06-29 ENCOUNTER — Other Ambulatory Visit: Payer: Self-pay | Admitting: Gastroenterology

## 2021-06-29 DIAGNOSIS — H353210 Exudative age-related macular degeneration, right eye, stage unspecified: Secondary | ICD-10-CM | POA: Diagnosis not present

## 2021-06-29 DIAGNOSIS — R634 Abnormal weight loss: Secondary | ICD-10-CM

## 2021-06-29 DIAGNOSIS — R1903 Right lower quadrant abdominal swelling, mass and lump: Secondary | ICD-10-CM

## 2021-06-29 DIAGNOSIS — H353211 Exudative age-related macular degeneration, right eye, with active choroidal neovascularization: Secondary | ICD-10-CM | POA: Diagnosis not present

## 2021-07-11 ENCOUNTER — Ambulatory Visit
Admission: RE | Admit: 2021-07-11 | Discharge: 2021-07-11 | Disposition: A | Discharge status: Home / Self Care | Payer: Medicare Other | Source: Ambulatory Visit | Attending: Gastroenterology | Admitting: Gastroenterology

## 2021-07-11 ENCOUNTER — Other Ambulatory Visit: Payer: Self-pay

## 2021-07-11 ENCOUNTER — Other Ambulatory Visit: Payer: Self-pay | Admitting: Gastroenterology

## 2021-07-11 DIAGNOSIS — R1314 Dysphagia, pharyngoesophageal phase: Secondary | ICD-10-CM | POA: Insufficient documentation

## 2021-07-11 DIAGNOSIS — K219 Gastro-esophageal reflux disease without esophagitis: Secondary | ICD-10-CM | POA: Insufficient documentation

## 2021-07-11 NOTE — Progress Notes (Signed)
Chronic Care Management Pharmacy Note  07/14/2021 Name:  Pam Cruz MRN:  [id number] DOB:  1935/10/06  Summary: PharmD FU RPM BP mostly controlled.  One elevated reading when she had barium swallow test.  Recommendations/Changes made from today's visit: Needs updated TSH  Plan: FU on phone in 3 months.   Subjective: Pam Cruz is an 86 y.o. year old female who is a primary patient of Humphrey Rolls, Timoteo Gaul, MD.  The CCM team was consulted for assistance with disease management and care coordination needs.    Engaged with patient by telephone for initial visit in response to provider referral for pharmacy case management and/or care coordination services.   Consent to Services:  The patient was given the following information about Chronic Care Management services today, agreed to services, and gave verbal consent: 1. CCM service includes personalized support from designated clinical staff supervised by the primary care provider, including individualized plan of care and coordination with other care providers 2. 24/7 contact phone numbers for assistance for urgent and routine care needs. 3. Service will only be billed when office clinical staff spend 20 minutes or more in a month to coordinate care. 4. Only one practitioner may furnish and bill the service in a calendar month. 5.The patient may stop CCM services at any time (effective at the end of the month) by phone call to the office staff. 6. The patient will be responsible for cost sharing (co-pay) of up to 20% of the service fee (after annual deductible is met). Patient agreed to services and consent obtained.  Patient Care Team: Lavera Guise, MD as PCP - General (Internal Medicine) Edythe Clarity, [facility] as Pharmacist (Pharmacist)  Recent office visits:  03/21/21 [name] - Dysuria, stopped Requip.  Referral to Derm for facial acne. 11/22/20 Lavera Guise, MD. For follow-up. CHANGED Clonidine 01. MG, 2 times daily, For blood  pressure above 160 as needed only.   Recent consult visits:  11/30/20 Podiatry [name], DPM. For foot care. No medication changes. 11/10/20 [name], Stana Bunting, NP and [place], Malena Catholic, MD. For follow-up. No medication changes. 10/28/20 Ophthalmology Isaias Sakai. For Exudative age-related macular degeneration of right eye. No medication changes. 10/20/20 Ophthalmology Isaias Sakai. For Exudative age-related macular degeneration of right eye. No information given.    Hospital visits:  None in previous 6 months Objective:  Lab Results  Component Value Date   CREATININE 0.84 11/22/2020   BUN 20 11/22/2020   GFRNONAA 64 11/22/2020   GFRAA 74 11/22/2020   NA 142 11/22/2020   K 5.2 11/22/2020   CALCIUM 10.1 11/22/2020   CO2 22 11/22/2020   GLUCOSE 87 11/22/2020    No results found for: HGBA1C, FRUCTOSAMINE, GFR, MICROALBUR  Last diabetic Eye exam: No results found for: HMDIABEYEEXA  Last diabetic Foot exam: No results found for: HMDIABFOOTEX   Lab Results  Component Value Date   CHOL 164 02/20/2018   HDL 71 02/20/2018   LDLCALC 86 02/20/2018   TRIG 36 02/20/2018    Hepatic Function Latest Ref Rng & Units 01/29/2020 01/19/2020 08/10/2019  Total Protein 6.5 - 8.1 g/dL 7.0 7.3 7.1  Albumin 3.5 - 5.0 g/dL 4.8 4.6 4.6  AST 15 - 41 U/L 29 33 31  ALT 0 - 44 U/L _0 Alk Phosphatase 38 - 126 U/L 80 93 75  Total Bilirubin 0.3 - 1.2 mg/dL 0.8 1.0 0.8    Lab Results  Component Value Date/Time  TSH 5.240 (H) 11/22/2020 01:03 PM   TSH 1.420 03/07/2020 04:30 PM   FREET4 1.27 11/22/2020 01:03 PM   FREET4 1.42 03/07/2020 04:30 PM    CBC Latest Ref Rng & Units 01/30/2020 01/29/2020 01/20/2020  WBC 4.0 - 10.5 K/uL 4.2 7.7 5.6  Hemoglobin 12.0 - 15.0 g/dL 10.8(L) 12.5 12.0  Hematocrit 36.0 - 46.0 % 31.8(L) 37.0 35.8(L)  Platelets 150 - 400 K/uL 290 349 313    No results found for: VD25OH  Clinical ASCVD: No  The ASCVD Risk score (Arnett DK, et al., 2019)  failed to calculate for the following reasons:   The 2019 ASCVD risk score is only valid for ages 16 to 88    Depression screen PHQ 2/9 03/21/2021 11/22/2020 07/19/2020  Decreased Interest 0 0 0  Down, Depressed, Hopeless 0 0 0  PHQ - 2 Score 0 0 0  Altered sleeping - - -  Tired, decreased energy - - -  Change in appetite - - -  Feeling bad or failure about yourself  - - -  Trouble concentrating - - -  Moving slowly or fidgety/restless - - -  Suicidal thoughts - - -  PHQ-9 Score - - -      Social History   Tobacco Use  Smoking Status Former   Types: Cigarettes   Quit date: 02/20/2001   Years since quitting: 20.4  Smokeless Tobacco Never  Tobacco Comments   quit 17 years ago   BP Readings from Last 3 Encounters:  05/11/21 140/86  03/21/21 140/80  11/22/20 (!) 146/100   Pulse Readings from Last 3 Encounters:  05/11/21 67  03/21/21 85  11/22/20 78   Wt Readings from Last 3 Encounters:  05/11/21 78 lb 3.2 oz (35.5 kg)  03/21/21 79 lb 3.2 oz (35.9 kg)  11/22/20 81 lb 12.8 oz (37.1 kg)   BMI Readings from Last 3 Encounters:  05/11/21 17.53 kg/m  03/21/21 17.76 kg/m  11/22/20 18.34 kg/m    Assessment/Interventions: Review of patient past medical history, allergies, medications, health status, including review of consultants reports, laboratory and other test data, was performed as part of comprehensive evaluation and provision of chronic care management services.   SDOH:  (Social Determinants of Health) assessments and interventions performed: Yes  Financial Resource Strain: Low Risk    Difficulty of Paying Living Expenses: Not very hard    SDOH Screenings   Alcohol Screen: Not on file  Depression (PHQ2-9): Low Risk    PHQ-2 Score: 0  Financial Resource Strain: Low Risk    Difficulty of Paying Living Expenses: Not very hard  Food Insecurity: Not on file  Housing: Not on file  Physical Activity: Not on file  Social Connections: Not on file  Stress: Not on file   Tobacco Use: Medium Risk   Smoking Tobacco Use: Former   Smokeless Tobacco Use: Never  Transportation Needs: Not on file    [place]  Allergies  Allergen Reactions   Ambien [Zolpidem Tartrate]     Pt unsure    Amoxicillin Nausea And Vomiting   Codeine     Other reaction(s): Abdominal Pain   Erythromycin Nausea And Vomiting   Penicillins Nausea And Vomiting   Reglan [Metoclopramide] Nausea And Vomiting   Sulfa Antibiotics Nausea And Vomiting    Medications Reviewed Today     Reviewed by Edythe Clarity, [facility] (Pharmacist) on 07/14/21 at 1523  Med List Status: <None>   Medication Order Taking? Sig Documenting Provider Last Dose  Status Informant  acetaminophen (TYLENOL) 500 MG tablet [id number] Yes Take 500 mg by mouth every 6 (six) hours as needed (pain).  [provider] Taking Active Self  amLODipine (NORVASC) 2.5 MG tablet [id number] Yes TAKE 1 TABLET BY MOUTH  DAILY Lavera Guise, MD Taking Active   bimatoprost (LUMIGAN) 0.01 % SOLN [id number] Yes INSTILL 1 DROP IN LEFT EYE AT BEDTIME [provider] Taking Active   clonazePAM (KLONOPIN) 0.5 MG tablet [id number] Yes Take 1 tablet (0.5 mg total) by mouth 2 (two) times daily as needed. for anxiety Lavera Guise, MD Taking Active   cloNIDine (CATAPRES) 0.1 MG tablet [id number] Yes Take 1 tablet (0.1 mg total) by mouth 2 (two) times daily. May take extra if needed Lavera Guise, MD Taking Active   desipramine (NORPRAMIN) 25 MG tablet [id number] Yes Take 1 tablet (25 mg total) by mouth daily. Lavera Guise, MD Taking Active   fluticasone-salmeterol [facility] INHUB) 250-50 MCG/ACT AEPB [id number] Yes Inhale 1 puff into the lungs in the morning and at bedtime. Lavera Guise, MD Taking Active   Homeopathic Products ([place]) Marden Noble [id number] Yes Apply 2 application topically See admin instructions. Apply two applications to various areas of the body at bedtime according to bottle instructions [provider] Taking Active Self           Med Note Francene Finders   Fri Jan 29, 2020  6:32 PM) Medication at bedside   hydrALAZINE (APRESOLINE) 25 MG tablet [id number] Yes TAKE 1 TABLET BY MOUTH 3  TIMES DAILY Luiz Ochoa, NP Taking Active   levothyroxine (SYNTHROID) 50 MCG tablet [id number] Yes Take 1 tablet (50 mcg total) by mouth daily. Lavera Guise, MD Taking Active   losartan (COZAAR) 50 MG tablet [id number] Yes TAKE 1 TABLET BY MOUTH  TWICE DAILY Luiz Ochoa, NP Taking Active   Multiple Vitamins-Minerals (PRESERVISION AREDS 2+MULTI VIT) CAPS [id number] Yes Take 1 capsule by mouth in the morning and at bedtime. [provider] Taking Active Self  omeprazole (PRILOSEC) 20 MG capsule [id number] Yes Take 20 mg by mouth 2 (two) times daily before a meal.  [provider] Taking Active Self  Polyethylene Glycol 400 (BLINK TEARS) 0.25 % GEL [id number] Yes Place 1-2 drops into both eyes as needed (dry eyes). [provider] Taking Active Self           Med Note Tamala Julian, Delfino Lovett   Fri Jan 29, 2020  6:31 PM) Medication at bedside  Polyethylene Glycol 400 (BLINK TEARS) 0.25 % SOLN [id number] Yes Apply to eye. [provider] Taking Active   sennosides-docusate sodium (SENOKOT-S) 8.6-50 MG tablet [id number] Yes Take 1-2 tablets by mouth at bedtime.  [provider] Taking Active Self  terbinafine (LAMISIL) 1 % cream [id number] Yes Apply 1 application topically 2 (two) times daily as needed (toes). [provider] Taking Active             Patient Active Problem List   Diagnosis Date Noted   Neutropenic fever ([place]) 01/29/2020   Hyponatremia 01/19/2020   COPD (chronic obstructive pulmonary disease) (HCC)    Anxiety    Gastroenteritis    Hypothyroidism    Right bundle branch block (RBBB) determined by electrocardiography 01/06/2020   Chronic intractable headache 01/06/2020   H/O total hip arthroplasty 08/17/2019   Essential hypertension 06/24/2018    GERD (gastroesophageal reflux disease) 12/10/2014   History of IBS 12/10/2014    Immunization History  Administered Date(s)  Administered   Influenza Inj Mdck Quad Pf 07/07/2019   Influenza, High Dose Seasonal PF 09/09/2017, 08/04/2018   PFIZER(Purple Top)SARS-COV-2 Vaccination 12/07/2019, 12/28/2019   Pneumococcal Polysaccharide-23 11/12/2018, 08/19/2019   Pneumococcal-Unspecified 11/12/2018    Conditions to be addressed/monitored:  HTN, COPD, GERD, Hypothyroidism, Anxiety  Care Plan : General Pharmacy (Adult)  Updates made by Edythe Clarity, RPH since 07/14/2021 12:00 AM     Problem: HTN, COPD, GERD, Hypothyroidism, Anxiety   Priority: High  Onset Date: 04/11/2021     Long-Range Goal: Patient-Specific Goal   Start Date: 04/11/2021  Expected End Date: 10/11/2021  Recent Progress: On track  Priority: High  Note:   Current Barriers:  Unable to independently monitor therapeutic efficacy  Pharmacist Clinical Goal(s):  Patient will achieve adherence to monitoring guidelines and medication adherence to achieve therapeutic efficacy adhere to plan to optimize therapeutic regimen for BP as evidenced by report of adherence to recommended medication management changes contact provider office for questions/concerns as evidenced notation of same in electronic health record through collaboration with PharmD and provider.   Interventions: 1:1 collaboration with Lavera Guise, MD regarding development and update of comprehensive plan of care as evidenced by provider attestation and co-signature Inter-disciplinary care team collaboration (see longitudinal plan of care) Comprehensive medication review performed; medication list updated in electronic medical record  Hypertension (BP goal <140/90) -Unsure of level of control, office BP's have been above goal -Current treatment: Amlodipine 2.2m daily Clonidine 0.165mdaily prn for systolic above 16336ydralazine 2571mid Losartan 62m41maily -Medications previously tried: metoprolol  -Current home readings: she does not check at home currently -Current dietary habits: well balanced diet, likes vegetables, has to watch which ones she eats because of stomach problems -Current exercise habits: in home workouts 4-5 times weekly lifting light weights or walking in place -Denies hypotensive/hypertensive symptoms -Educated on BP goals and benefits of medications for prevention of heart attack, stroke and kidney damage; Daily salt intake goal < 2300 mg; Importance of home blood pressure monitoring; Symptoms of hypotension and importance of maintaining adequate hydration; -She does not have a way to monitor her blood pressure at home currently.  She is supposed to be taking clonidine prn when systolic BP is above 160.122urrently she just takes it "when she can tell her BP is elevated" -I believe she is a great candidate for RPM device so that she can check and manage her BP at home, this will also communicate alerts to the office. -Recommended to continue current medication -Will set up with RPM blood pressure machine.  Update 07/14/21 BP with RPM has been mostly controlled.  She had one critical reading with systolic > 180.449his was the day she had barium swallow test and reports she was in tremendous pain which explains the elevated reading.  No changes warranted at this time. Continue to monitor using RPM Remain active with exercises  No changes to meds  COPD (Goal: control symptoms and prevent exacerbations) -Controlled -Current treatment  Wixela 250-50 1 puff twice daily -Medications previously tried: Singulair   -Pulmonary function testing: Pulmonary Functions Testing Results: No results found for: FEV1, FVC, FEV1FVC, TLC, DLCO -Exacerbations requiring treatment in last 6 months: none -Patient denies consistent use of maintenance inhaler, she takes it once daily but does not always use twice daily -Frequency of rescue  inhaler use: N/A -Counseled on Benefits of consistent maintenance inhaler use -Recommended to continue current medication Recommended she use Advair twice daily as prescribed  this may help with her coughing up phlegm  Depression/Anxiety (Goal: Minimze symptoms) -Not ideally controlled -Current treatment: Desipramine 63m daily Clonazepam 0.532mbid prn -Medications previously tried/failed: none noted -PHQ9:  [facility]     -GAD7: No flowsheet data found. -Educated on Benefits of medication for symptom control -Stable mood, has to avoid television and news sometimes due to everything going on. -Recommended to continue current medication  GERD (Goal: Minimize symptoms) -Controlled -Current treatment  Omeprazole 2045maily -Medications previously tried: none noted -Not having too many symptoms of acid reflux -Takes medication appropriately, working on limiting trigger foods -Recommended to continue current medication  Hypothyroidism (Goal: Maintain TSH) -Uncontrolled -Current treatment  Levothyroxine 81m65maily - she is taking a half tablet daily -Medications previously tried: none noted -Last TSH was elevated, she is now taking half tablet daily. -Takes appropriately 30 minutes before food or other meds on empty stomach.  -Recommended to continue current medication Recommended recheck TSH at next OV.  Update 07/14/21 Physical upcoming in December, she has not had thyroid test since February. Still taking medication correctly - half tablet daily Recommend recheck TSH at upcoming physical to ensure patient is on correct dose of thyroid medication.  Patient Goals/Self-Care Activities Patient will:  - take medications as prescribed focus on medication adherence by pill box check blood pressure daily, document, and provide at future appointments  Follow Up Plan: The care management team  will reach out to the patient again over the next 90 days.            Medication Assistance: None required.  Patient affirms current coverage meets needs.  Compliance/Adherence/Medication fill history: Care Gaps: Shingrix  Star-Rating Drugs: Losaratn 81mg56mds 03/08/21  Patient's preferred pharmacy is:  [facility] [address] [place] [address] [place]: [id number]: [phone] pill box? Yes Pt endorses 100% compliance  We discussed: Benefits of medication synchronization, packaging and delivery as well as enhanced pharmacist oversight with [employer]. Patient decided to: Continue current medication management strategy  Care Plan and Follow Up Patient Decision:  Patient agrees to Care Plan and Follow-up.  Plan: The care management team will reach out to the patient again over the next 90 days.  ChrisBeverly MilchrmD Clinical Pharmacist [phone]

## 2021-07-12 ENCOUNTER — Ambulatory Visit
Admission: RE | Admit: 2021-07-12 | Discharge: 2021-07-12 | Disposition: A | Discharge status: Home / Self Care | Payer: Medicare Other | Source: Ambulatory Visit | Attending: Gastroenterology | Admitting: Gastroenterology

## 2021-07-12 ENCOUNTER — Other Ambulatory Visit: Payer: Self-pay | Admitting: Gastroenterology

## 2021-07-12 DIAGNOSIS — R1903 Right lower quadrant abdominal swelling, mass and lump: Secondary | ICD-10-CM | POA: Insufficient documentation

## 2021-07-12 DIAGNOSIS — R634 Abnormal weight loss: Secondary | ICD-10-CM

## 2021-07-14 ENCOUNTER — Telehealth: Payer: Self-pay | Admitting: Pharmacist

## 2021-07-14 ENCOUNTER — Ambulatory Visit: Payer: Medicare Other

## 2021-07-14 DIAGNOSIS — E039 Hypothyroidism, unspecified: Secondary | ICD-10-CM

## 2021-07-14 DIAGNOSIS — I10 Essential (primary) hypertension: Secondary | ICD-10-CM | POA: Diagnosis not present

## 2021-07-14 DIAGNOSIS — K219 Gastro-esophageal reflux disease without esophagitis: Secondary | ICD-10-CM | POA: Diagnosis not present

## 2021-07-14 DIAGNOSIS — J449 Chronic obstructive pulmonary disease, unspecified: Secondary | ICD-10-CM | POA: Diagnosis not present

## 2021-07-14 NOTE — Patient Instructions (Signed)
Visit Information   Goals Addressed   None    Patient Care Plan: General Pharmacy (Adult)     Problem Identified: HTN, COPD, GERD, Hypothyroidism, Anxiety   Priority: High  Onset Date: 04/11/2021     Long-Range Goal: Patient-Specific Goal   Start Date: 04/11/2021  Expected End Date: 10/11/2021  This Visit's Progress: On track  Priority: High  Note:   Current Barriers:  Unable to independently monitor therapeutic efficacy  Pharmacist Clinical Goal(s):  Patient will achieve adherence to monitoring guidelines and medication adherence to achieve therapeutic efficacy adhere to plan to optimize therapeutic regimen for BP as evidenced by report of adherence to recommended medication management changes contact provider office for questions/concerns as evidenced notation of same in electronic health record through collaboration with PharmD and provider.   Interventions: 1:1 collaboration with Lavera Guise, MD regarding development and update of comprehensive plan of care as evidenced by provider attestation and co-signature Inter-disciplinary care team collaboration (see longitudinal plan of care) Comprehensive medication review performed; medication list updated in electronic medical record  Hypertension (BP goal <140/90) -Unsure of level of control, office BP's have been above goal -Current treatment: Amlodipine 2.5mg  daily Clonidine 0.1mg  daily prn for systolic above 063 Hydralazine 25mg  tid Losartan 50mg  daily -Medications previously tried: metoprolol  -Current home readings: she does not check at home currently -Current dietary habits: well balanced diet, likes vegetables, has to watch which ones she eats because of stomach problems -Current exercise habits: in home workouts 4-5 times weekly lifting light weights or walking in place -Denies hypotensive/hypertensive symptoms -Educated on BP goals and benefits of medications for prevention of heart attack, stroke and kidney  damage; Daily salt intake goal < 2300 mg; Importance of home blood pressure monitoring; Symptoms of hypotension and importance of maintaining adequate hydration; -She does not have a way to monitor her blood pressure at home currently.  She is supposed to be taking clonidine prn when systolic BP is above 016.  Currently she just takes it "when she can tell her BP is elevated" -I believe she is a great candidate for RPM device so that she can check and manage her BP at home, this will also communicate alerts to the office. -Recommended to continue current medication -Will set up with RPM blood pressure machine.  COPD (Goal: control symptoms and prevent exacerbations) -Controlled -Current treatment  Advair 250-50 1 puff twice daily -Medications previously tried: Singulair    -Pulmonary function testing: Pulmonary Functions Testing Results:  No results found for: FEV1, FVC, FEV1FVC, TLC, DLCO  -Exacerbations requiring treatment in last 6 months: none -Patient denies consistent use of maintenance inhaler, she takes it once daily but does not always use twice daily -Frequency of rescue inhaler use: N/A -Counseled on Benefits of consistent maintenance inhaler use -Recommended to continue current medication Recommended she use Advair twice daily as prescribed this may help with her coughing up phlegm  Depression/Anxiety (Goal: Minimze symptoms) -Not ideally controlled -Current treatment: Desipramine 25mg  daily Clonazepam 0.5mg  bid prn -Medications previously tried/failed: none noted -PHQ9:  [place] Office Visit from 02/18/2018 in [facility]  PHQ-9 Total Score 4     -GAD7: No flowsheet data found.  -Educated on Benefits of medication for symptom control -Stable mood, has to avoid television and news sometimes due to everything going on. -Recommended to continue current medication  GERD (Goal: Minimize symptoms) -Controlled -Current treatment  Omeprazole  20mg  daily -Medications previously tried: none noted -Not having too many symptoms  of acid reflux -Takes medication appropriately, working on limiting trigger foods -Recommended to continue current medication  Hypothyroidism (Goal: Maintain TSH) -Uncontrolled -Current treatment  Levothyroxine 16mcg daily - she is taking a half tablet daily -Medications previously tried: none noted -Last TSH was elevated, she is now taking half tablet daily. -Takes appropriately 30 minutes before food or other meds on empty stomach.  -Recommended to continue current medication Recommended recheck TSH at next OV.  Patient Goals/Self-Care Activities Patient will:  - take medications as prescribed focus on medication adherence by pill box check blood pressure daily, document, and provide at future appointments  Follow Up Plan: The care management team will reach out to the patient again over the next 90 days.        Patient verbalizes understanding of instructions provided today and agrees to view in [place].  Telephone follow up appointment with pharmacy team member scheduled for: 4 months  Edythe Clarity, [place]

## 2021-07-14 NOTE — Chronic Care Management (AMB) (Signed)
Chronic Care Management   Outreach Note  07/14/2021 Name: Pam Cruz MRN: [id number] DOB: 12/25/1934  Referred by: Lavera Guise, MD Reason for referral : Chronic Care Management (RPM Monthly Report)      Pam Cruz  Name: Pam Cruz  Date of Birth: [birth year]  Gender: Female  Primary provider: [name]  RPM Coordinator: Charlann Lange  ICD Code: I10 Device ID: [id number] [place]: iBP Measurement Type: blood pressure Days of Monitoring: 17 Documentation Time: 14 Min 0 Sec BLOOD PRESSURE  Measurement Date: 07/14/2021 Friday at 42:68 PM Systolic / Diastolic (mmHg): 341 / 84 Measurement Date: 07/13/2021 Thursday at 96:22 PM Systolic / Diastolic (mmHg): 297 / 86 Measurement Date: 07/11/2021 Tuesday at 98:92 PM Systolic / Diastolic (mmHg): 119 / 97 Measurement Date: 07/10/2021 Monday at 41:74 PM Systolic / Diastolic (mmHg): 081 / 78 Measurement Date: 07/10/2021 Monday at 44:81 PM Systolic / Diastolic (mmHg): 856 / 70 Measurement Date: 07/09/2021 Sunday at 31:49 PM Systolic / Diastolic (mmHg): 702 / 80 Measurement Date: 07/09/2021 Sunday at 63:78 PM Systolic / Diastolic (mmHg): 588 / 93 Measurement Date: 07/07/2021 Friday at 50:27 PM Systolic / Diastolic (mmHg): 741 / 79 Measurement Date: 07/07/2021 Friday at 28:78 PM Systolic / Diastolic (mmHg): 676 / 93 Measurement Date: 07/06/2021 Thursday at 72:09 PM Systolic / Diastolic (mmHg): 470 / 79 Measurement Date: 07/04/2021 Tuesday at 96:28 PM Systolic / Diastolic (mmHg): 366 / 75 Measurement Date: 07/04/2021 Tuesday at 29:47 PM Systolic / Diastolic (mmHg): 654 / 76 Measurement Date: 07/02/2021 Sunday at 65:03 PM Systolic / Diastolic (mmHg): 546 / 78 Measurement Date: 07/02/2021 Sunday at 56:81 PM Systolic / Diastolic (mmHg): 275 / 79 Measurement Date: 06/30/2021 Friday at 17:00 PM Systolic / Diastolic (mmHg): 174 / 77 Measurement Date: 06/30/2021 Friday at 94:49 PM Systolic / Diastolic (mmHg): 675 /  90 Measurement Date: 06/28/2021 Wednesday at 91:63 PM Systolic / Diastolic (mmHg): 846 / 95 Measurement Date: 06/28/2021 Wednesday at 65:99 PM Systolic / Diastolic (mmHg): 357 / 84 Measurement Date: 06/26/2021 Monday at 01:77 PM Systolic / Diastolic (mmHg): 939 / 89 Measurement Date: 06/26/2021 Monday at 03:00 PM Systolic / Diastolic (mmHg): 923 / 89 Measurement Date: 06/24/2021 Saturday at 30:07 PM Systolic / Diastolic (mmHg): 622 / 80 Measurement Date: 06/24/2021 Saturday at 63:33 PM Systolic / Diastolic (mmHg): 545 / 73 Measurement Date: 06/22/2021 Thursday at 62:56 PM Systolic / Diastolic (mmHg): 389 / 80 Measurement Date: 06/22/2021 Thursday at 37:34 PM Systolic / Diastolic (mmHg): 287 / 81 Measurement Date: 06/20/2021 Tuesday at 68:11 PM Systolic / Diastolic (mmHg): 572 / 81 Measurement Date: 06/20/2021 Tuesday at 62:03 PM Systolic / Diastolic (mmHg): 559 / 80 Measurement Date: 06/18/2021 Sunday at 74:16 PM Systolic / Diastolic (mmHg): 384 / 86 Measurement Date: 06/18/2021 Sunday at 53:64 PM Systolic / Diastolic (mmHg): 680 / 87 Measurement Date: 06/16/2021 Friday at 32:12 PM Systolic / Diastolic (mmHg): 248 / 89 Measurement Date: 06/16/2021 Friday at 25:00 PM Systolic / Diastolic (mmHg): 370 / 85  PULSE  Measurement Date: 07/14/2021 Friday at 02:30 PM Pulse (IN BPM): 62 Measurement Date: 07/13/2021 Thursday at 05:58 PM Pulse (IN BPM): 71 Measurement Date: 07/11/2021 Tuesday at 07:33 PM Pulse (IN BPM): 57 Measurement Date: 07/10/2021 Monday at 11:37 PM Pulse (IN BPM): 61 Measurement Date: 07/10/2021 Monday at 03:56 PM Pulse (IN BPM): 71 Measurement Date: 07/09/2021 Sunday at 11:21 PM Pulse (IN BPM): 59 Measurement Date: 07/09/2021 Sunday at 04:05 PM Pulse (IN BPM): 74 Measurement Date: 07/07/2021 Friday at 11:38 PM Pulse (IN BPM):  68 Measurement Date: 07/07/2021 Friday at 07:12 PM Pulse (IN BPM): 119 Measurement Date: 07/06/2021 Thursday at 04:14 PM Pulse (IN BPM): 76 Measurement  Date: 07/04/2021 Tuesday at 11:42 PM Pulse (IN BPM): 58 Measurement Date: 07/04/2021 Tuesday at 04:23 PM Pulse (IN BPM): 64 Measurement Date: 07/02/2021 Sunday at 11:50 PM Pulse (IN BPM): 66 Measurement Date: 07/02/2021 Sunday at 04:17 PM Pulse (IN BPM): 71 Measurement Date: 06/30/2021 Friday at 11:57 PM Pulse (IN BPM): 71 Measurement Date: 06/30/2021 Friday at 04:11 PM Pulse (IN BPM): 71 Measurement Date: 06/28/2021 Wednesday at 11:28 PM Pulse (IN BPM): 60 Measurement Date: 06/28/2021 Wednesday at 01:53 PM Pulse (IN BPM): 74 Measurement Date: 06/26/2021 Monday at 11:41 PM Pulse (IN BPM): 65 Measurement Date: 06/26/2021 Monday at 05:47 PM Pulse (IN BPM): 74 Measurement Date: 06/24/2021 Saturday at 11:39 PM Pulse (IN BPM): 61 Measurement Date: 06/24/2021 Saturday at 04:13 PM Pulse (IN BPM): 73 Measurement Date: 06/22/2021 Thursday at 11:44 PM Pulse (IN BPM): 68 Measurement Date: 06/22/2021 Thursday at 12:49 PM Pulse (IN BPM): 71 Measurement Date: 06/20/2021 Tuesday at 11:54 PM Pulse (IN BPM): 71 Measurement Date: 06/20/2021 Tuesday at 03:44 PM Pulse (IN BPM): 79 Measurement Date: 06/18/2021 Sunday at 11:40 PM Pulse (IN BPM): 64 Measurement Date: 06/18/2021 Sunday at 04:01 PM Pulse (IN BPM): 67 Measurement Date: 06/16/2021 Friday at 11:43 PM Pulse (IN BPM): 66 Measurement Date: 06/16/2021 Friday at 01:22 PM Pulse (IN BPM): 67        Notes for September-2022 : 25 Min 0 Sec  Date and Time: 07/14/2021 Friday at 02:40 PM User: Leata Mouse Time logged: 06:00 Notes: DATA REVIEW SYSTOLIC BP  Automatically transmitted data is reviewed today from device. (ID: [id number]).  Systolic BP reading is >628 for 3.33 % of time  Systolic BP reading is between 160 to 179 for 3.33 % of time  Systolic BP reading is between 140 to 159 for 33.33 % of time  Systolic BP reading is between 120-139 for 43.33 % of time  Systolic BP reading is between 101-120 for 16.67 % of time  Systolic BP  reading is <315 for 0 % of time  DIASTOLIC BP  Diastolic BP reading is elevated >100 for 0 % of time  Diastolic BP reading is between 90-99 for 16.67 % of time  PULSE  Pulse is >120 for 0 % of time  Pulse is <50 for 0 % of time  COMPLIANCE NOTES BLOOD PRESSURE  Patient is compliant - 16 days of readings obtained.  MANAGEMENT NOTES We will continue to monitor BP readings for now  Medications for BP reviewed for this patient  No change on the treatment plan   Date and Time: 07/13/2021 Thursday at 11:31 AM User: Leata Mouse Time logged: 05:00 Notes: REVIEW NOTES Systolic BP elevated at times  All the daily Blood pressure data noted since last review  BP numbers are stable except some elevations  MANAGEMENT NOTES We will continue to monitor BP readings for now  No change on the treatment plan   Date and Time: 07/11/2021 Tuesday at 04:24 PM User: Charlann Lange Time logged: 02:00 Notes: Left  a voicemail to remind the patient that she has two more day of her 16 days.    Date and Time: 07/10/2021 Monday at 03:22 PM User: Charlann Lange Time logged: 04:00 Notes: REVIEW NOTES All the daily Blood pressure data noted since last review  Heart rate/Pulse data reviewed   Date and Time: 07/07/2021 Friday at 03:35 PM User: Charlann Lange Time  logged: 02:00 Notes: REVIEW NOTES All the daily Blood pressure data noted since last review  Heart rate/Pulse data reviewed   Date and Time: 07/05/2021 Wednesday at 04:26 PM User: Charlann Lange Time logged: 04:00 Notes: REVIEW NOTES All the daily Blood pressure data noted since last review  Heart rate/Pulse data reviewed   Date and Time: 07/03/2021 Monday at 04:35 PM User: Charlann Lange Time logged: 06:00 Notes: REVIEW NOTES All the daily Blood pressure data noted since last review  Heart rate/Pulse data reviewed   Date and Time: 06/30/2021 Friday at 04:17 PM User: Charlann Lange Time logged: 02:00 Notes: REVIEW NOTES All the daily Blood pressure data noted since last review  Heart rate/Pulse data reviewed   Date and Time: 06/29/2021 Thursday at 04:06 PM User: Charlann Lange Time logged: 02:00 Notes: REVIEW NOTES All the daily Blood pressure data noted since last review  Heart rate/Pulse data reviewed   Date and Time: 06/28/2021 Wednesday at 04:32 PM User: Charlann Lange Time logged: 02:00 Notes: REVIEW NOTES All the daily Blood pressure data noted since last review  Heart rate/Pulse data reviewed   Date and Time: 06/26/2021 Monday at 04:12 PM User: Charlann Lange Time logged: 04:00 Notes: REVIEW NOTES All the daily Blood pressure data noted since last review  Heart rate/Pulse data reviewed   Date and Time: 06/22/2021 Thursday at 03:26 PM User: Charlann Lange Time logged: 02:00 Notes: REVIEW NOTES All the daily Blood pressure data noted since last review  Heart rate/Pulse data reviewed   Date and Time: 06/20/2021 Tuesday at 04:23 PM User: Charlann Lange Time logged: 06:00 Notes: REVIEW NOTES All the daily Blood pressure data noted since last review  Heart rate/Pulse data reviewed   Date and Time: 06/16/2021 Friday at 03:48 PM User: Charlann Lange Time logged: 02:00 Notes: REVIEW NOTES All the daily Blood pressure data noted since last review  Heart rate/Pulse data reviewed  Verbal Consent Obtained on April 12, 2021. Patient was notified about Remote Patient Monitoring (RPM) program offered by our medical practice to improve the patient care, co-ordination of care, patient education, provide quality of care, reduce the cost and provide personalized care. Patient was informed that this is Medicare approved program and can be furnished by only one provider, can be stopped by the patient anytime. Patient has verbally consented to enroll in this program and our designated care coordinator to contact by  phone as needed at least for 20 minutes a month. Practice also provides 24/7 care to the patient needs. All patient questions were answered and patient was enrolled in the RPM program.

## 2021-07-25 DIAGNOSIS — Z96641 Presence of right artificial hip joint: Secondary | ICD-10-CM | POA: Diagnosis not present

## 2021-08-06 ENCOUNTER — Other Ambulatory Visit: Payer: Self-pay | Admitting: Internal Medicine

## 2021-08-06 DIAGNOSIS — I10 Essential (primary) hypertension: Secondary | ICD-10-CM

## 2021-08-14 ENCOUNTER — Telehealth: Payer: Self-pay | Admitting: Pharmacist

## 2021-08-14 DIAGNOSIS — I10 Essential (primary) hypertension: Secondary | ICD-10-CM | POA: Diagnosis not present

## 2021-08-14 DIAGNOSIS — K219 Gastro-esophageal reflux disease without esophagitis: Secondary | ICD-10-CM | POA: Diagnosis not present

## 2021-08-14 DIAGNOSIS — J449 Chronic obstructive pulmonary disease, unspecified: Secondary | ICD-10-CM | POA: Diagnosis not present

## 2021-08-14 NOTE — Chronic Care Management (AMB) (Signed)
Chronic Care Management   Outreach Note  08/14/2021 Name: Pam Cruz MRN: [id number] DOB: [birth year]  Referred by: Lavera Guise, MD Reason for referral : No chief complaint on file.     [place]  Name: Pam Cruz  Date of Birth: 10/22/1934  Gender: Female  Primary provider: [name]  RPM Coordinator: Charlann Lange  ICD Code: I10 Device ID: [id number] [place]: iBP Measurement Type: blood pressure Days of Monitoring: 15 Documentation Time: 88 Min 0 Sec BLOOD PRESSURE  Measurement Date: 08/12/2021 Saturday at 61:44 PM Systolic / Diastolic (mmHg): 315 / 76 Measurement Date: 08/12/2021 Saturday at 40:08 PM Systolic / Diastolic (mmHg): 676 / 88 Measurement Date: 08/09/2021 Wednesday at 19:50 PM Systolic / Diastolic (mmHg): 932 / 79 Measurement Date: 08/09/2021 Wednesday at 67:12 PM Systolic / Diastolic (mmHg): 458 / 89 Measurement Date: 08/07/2021 Monday at 09:98 PM Systolic / Diastolic (mmHg): 338 / 78 Measurement Date: 08/07/2021 Monday at 25:05 PM Systolic / Diastolic (mmHg): 397 / 83 Measurement Date: 08/06/2021 Sunday at 67:34 PM Systolic / Diastolic (mmHg): 193 / 76 Measurement Date: 08/04/2021 Friday at 79:02 PM Systolic / Diastolic (mmHg): 409 / 79 Measurement Date: 08/04/2021 Friday at 73:53 PM Systolic / Diastolic (mmHg): 299 / 92 Measurement Date: 08/02/2021 Wednesday at 24:26 PM Systolic / Diastolic (mmHg): 834 / 84 Measurement Date: 08/02/2021 Wednesday at 19:62 PM Systolic / Diastolic (mmHg): 229 / 75 Measurement Date: 08/01/2021 Tuesday at 79:89 PM Systolic / Diastolic (mmHg): 211 / 95 Measurement Date: 08/01/2021 Tuesday at 94:17 PM Systolic / Diastolic (mmHg): 408 / 84 Measurement Date: 07/28/2021 Friday at 14:48 PM Systolic / Diastolic (mmHg): 185 / 90 Measurement Date: 07/28/2021 Friday at 63:14 PM Systolic / Diastolic (mmHg): 970 / 83 Measurement Date: 07/26/2021 Wednesday at 26:37 PM Systolic / Diastolic (mmHg): 858 / 82 Measurement  Date: 07/26/2021 Wednesday at 85:02 PM Systolic / Diastolic (mmHg): 774 / 83 Measurement Date: 07/24/2021 Monday at 12:87 PM Systolic / Diastolic (mmHg): 867 / 83 Measurement Date: 07/24/2021 Monday at 67:20 PM Systolic / Diastolic (mmHg): 947 / 81 Measurement Date: 07/22/2021 Saturday at 09:62 PM Systolic / Diastolic (mmHg): 836 / 82 Measurement Date: 07/22/2021 Saturday at 62:94 PM Systolic / Diastolic (mmHg): 765 / 81 Measurement Date: 07/20/2021 Thursday at 46:50 PM Systolic / Diastolic (mmHg): 354 / 70 Measurement Date: 07/20/2021 Thursday at 65:68 PM Systolic / Diastolic (mmHg): 127 / 73 Measurement Date: 07/19/2021 Wednesday at 51:70 PM Systolic / Diastolic (mmHg): 017 / 64 Measurement Date: 07/19/2021 Wednesday at 49:44 PM Systolic / Diastolic (mmHg): 967 / 76 Measurement Date: 07/18/2021 Tuesday at 59:16 PM Systolic / Diastolic (mmHg): 384 / 88 Measurement Date: 07/16/2021 Sunday at 66:59 PM Systolic / Diastolic (mmHg): 935 / 82 Measurement Date: 07/16/2021 Sunday at 70:17 PM Systolic / Diastolic (mmHg): 793 / 82  PULSE  Measurement Date: 08/12/2021 Saturday at 11:41 PM Pulse (IN BPM): 62 Measurement Date: 08/12/2021 Saturday at 04:48 PM Pulse (IN BPM): 71 Measurement Date: 08/09/2021 Wednesday at 11:35 PM Pulse (IN BPM): 66 Measurement Date: 08/09/2021 Wednesday at 05:28 PM Pulse (IN BPM): 61 Measurement Date: 08/07/2021 Monday at 11:56 PM Pulse (IN BPM): 60 Measurement Date: 08/07/2021 Monday at 03:19 PM Pulse (IN BPM): 66 Measurement Date: 08/06/2021 Sunday at 05:16 PM Pulse (IN BPM): 71 Measurement Date: 08/04/2021 Friday at 11:46 PM Pulse (IN BPM): 65 Measurement Date: 08/04/2021 Friday at 02:04 PM Pulse (IN BPM): 67 Measurement Date: 08/02/2021 Wednesday at 11:44 PM Pulse (IN BPM): 60 Measurement Date: 08/02/2021 Wednesday at 04:03 PM  Pulse (IN BPM): 68 Measurement Date: 08/01/2021 Tuesday at 11:08 PM Pulse (IN BPM): 82 Measurement Date: 08/01/2021 Tuesday at 06:29 PM Pulse  (IN BPM): 72 Measurement Date: 07/28/2021 Friday at 11:36 PM Pulse (IN BPM): 63 Measurement Date: 07/28/2021 Friday at 03:18 PM Pulse (IN BPM): 74 Measurement Date: 07/26/2021 Wednesday at 11:51 PM Pulse (IN BPM): 59 Measurement Date: 07/26/2021 Wednesday at 04:52 PM Pulse (IN BPM): 58 Measurement Date: 07/24/2021 Monday at 11:51 PM Pulse (IN BPM): 70 Measurement Date: 07/24/2021 Monday at 01:44 PM Pulse (IN BPM): 69 Measurement Date: 07/22/2021 Saturday at 11:30 PM Pulse (IN BPM): 56 Measurement Date: 07/22/2021 Saturday at 03:09 PM Pulse (IN BPM): 75 Measurement Date: 07/20/2021 Thursday at 11:46 PM Pulse (IN BPM): 59 Measurement Date: 07/20/2021 Thursday at 03:10 PM Pulse (IN BPM): 68 Measurement Date: 07/19/2021 Wednesday at 11:51 PM Pulse (IN BPM): 62 Measurement Date: 07/19/2021 Wednesday at 03:59 PM Pulse (IN BPM): 66 Measurement Date: 07/18/2021 Tuesday at 04:03 PM Pulse (IN BPM): 67 Measurement Date: 07/16/2021 Sunday at 10:50 PM Pulse (IN BPM): 66 Measurement Date: 07/16/2021 Sunday at 04:10 PM Pulse (IN BPM): 73        Notes for October-2022 : 66 Min 0 Sec  Date and Time: 08/14/2021 Monday at 03:31 PM User: Leata Mouse Time logged: 07:00 Notes: DATA REVIEW SYSTOLIC BP  Automatically transmitted data is reviewed today from device. (ID: [id number]).  Systolic BP reading is >865 for 3.57 % of time  Systolic BP reading is between 160 to 179 for 14.29 % of time  Systolic BP reading is between 140 to 159 for 42.86 % of time  Systolic BP reading is between 120-139 for 25.0 % of time  Systolic BP reading is between 101-120 for 17.86 % of time  Systolic BP reading is <784 for 0 % of time  DIASTOLIC BP  Diastolic BP reading is elevated >100 for 0 % of time  Diastolic BP reading is between 90-99 for 10.71 % of time  PULSE  Pulse is >120 for 0 % of time  Pulse is <50 for 0 % of time  MANAGEMENT NOTES We will continue to monitor BP readings for  now  Medications for BP reviewed for this patient  No change on the treatment plan   Date and Time: 08/10/2021 Thursday at 04:35 PM User: Charlann Lange Time logged: 04:00 Notes: REVIEW NOTES All the daily Blood pressure data noted since last review  Heart rate/Pulse data reviewed   Date and Time: 08/07/2021 Monday at 04:22 PM User: Charlann Lange Time logged: 06:00 Notes: REVIEW NOTES All the daily Blood pressure data noted since last review  Heart rate/Pulse data reviewed   Date and Time: 08/04/2021 Friday at 04:05 PM User: Charlann Lange Time logged: 02:00 Notes: REVIEW NOTES All the daily Blood pressure data noted since last review  Heart rate/Pulse data reviewed   Date and Time: 08/02/2021 Wednesday at 04:15 PM User: Charlann Lange Time logged: 06:00 Notes: REVIEW NOTES All the daily Blood pressure data noted since last review  Heart rate/Pulse data reviewed   Date and Time: 07/31/2021 Monday at 04:25 PM User: Charlann Lange Time logged: 02:00 Notes: REVIEW NOTES All the daily Blood pressure data noted since last review  Heart rate/Pulse data reviewed   Date and Time: 07/28/2021 Friday at 04:22 PM User: Charlann Lange Time logged: 02:00 Notes: REVIEW NOTES All the daily Blood pressure data noted since last review  Heart rate/Pulse data reviewed   Date and Time: 07/27/2021 Thursday at 04:24  PM User: Charlann Lange Time logged: 04:00 Notes: REVIEW NOTES All the daily Blood pressure data noted since last review  Heart rate/Pulse data reviewed   Date and Time: 07/24/2021 Monday at 04:24 PM User: Charlann Lange Time logged: 06:00 Notes: REVIEW NOTES All the daily Blood pressure data noted since last review  Heart rate/Pulse data reviewed   Date and Time: 07/20/2021 Thursday at 04:12 PM User: Charlann Lange Time logged: 02:00 Notes: REVIEW NOTES All the daily Blood pressure data noted since last  review  Heart rate/Pulse data reviewed   Date and Time: 07/19/2021 Wednesday at 04:08 PM User: Leata Mouse Time logged: 02:00 Notes: Reviewed in response to critical irregular heartbeat alert. REVIEW NOTES Heart rate/Pulse data reviewed  MANAGEMENT NOTES We will continue to monitor BP readings for now  Medications for BP reviewed for this patient  No change on the treatment plan   Date and Time: 07/17/2021 Monday at 04:10 PM User: Charlann Lange Time logged: 04:00 Notes: REVIEW NOTES All the daily Blood pressure data noted since last review  Heart rate/Pulse data reviewed  Verbal Consent Obtained on April 12, 2021. Patient was notified about Remote Patient Monitoring (RPM) program offered by our medical practice to improve the patient care, co-ordination of care, patient education, provide quality of care, reduce the cost and provide personalized care. Patient was informed that this is Medicare approved program and can be furnished by only one provider, can be stopped by the patient anytime. Patient has verbally consented to enroll in this program and our designated care coordinator to contact by phone as needed at least for 20 minutes a month. Practice also provides 24/7 care to the patient needs. All patient questions were answered and patient was enrolled in the RPM program.

## 2021-08-18 ENCOUNTER — Other Ambulatory Visit: Payer: Self-pay | Admitting: Internal Medicine

## 2021-08-23 DIAGNOSIS — H40052 Ocular hypertension, left eye: Secondary | ICD-10-CM | POA: Diagnosis not present

## 2021-08-24 ENCOUNTER — Telehealth: Payer: Self-pay | Admitting: Student-PharmD

## 2021-08-24 NOTE — Progress Notes (Addendum)
  Chronic Care Management Pharmacy Assistant   Name: MARLIA SCHEWE  MRN: [id number] DOB: [birth year]  Reason for Encounter: General Disease State Call   Conditions to be addressed/monitored: HTN, COPD, GERD, Hypothyroidism, Anxiety  Recent office visits:  05/11/21 Dr. Humphrey Rolls For follow-up. No medication changes.   Recent consult visits:  06/29/21 Ophthalmology Isaias Sakai For follow-up. No medication changes. 06/28/21 Podiatry [name], DPM For follow-Up. No medication changes.  06/13/21 [name], Stana Bunting, NP. No medication changes.  04/27/21 Ophthalmology Isaias Sakai For follow-up. No medication changes.  04/12/21 Ophthalmology Porfilio, Gwyndolyn Saxon. For Hordeolum internum right upper eyelid.  Hospital visits:  None since last pharm D visit on 04/11/21  Medications: Outpatient Encounter Medications as of 08/24/2021  Medication Sig Note   acetaminophen (TYLENOL) 500 MG tablet Take 500 mg by mouth every 6 (six) hours as needed (pain).     amLODipine (NORVASC) 2.5 MG tablet TAKE 1 TABLET BY MOUTH  DAILY    bimatoprost (LUMIGAN) 0.01 % SOLN INSTILL 1 DROP IN LEFT EYE AT BEDTIME    clonazePAM (KLONOPIN) 0.5 MG tablet Take 1 tablet (0.5 mg total) by mouth 2 (two) times daily as needed. for anxiety    cloNIDine (CATAPRES) 0.1 MG tablet TAKE 1 TABLET BY MOUTH  TWICE DAILY MAY TAKE EXTRA  TABLET IF NEEDED (MAXIMUM 3 TABLETS A DAY)    desipramine (NORPRAMIN) 25 MG tablet Take 1 tablet (25 mg total) by mouth daily.    Homeopathic Products ([place]) FOAM Apply 2 application topically See admin instructions. Apply two applications to various areas of the body at bedtime according to bottle instructions 01/29/2020: Medication at bedside    hydrALAZINE (APRESOLINE) 25 MG tablet TAKE 1 TABLET BY MOUTH 3  TIMES DAILY    levothyroxine (SYNTHROID) 50 MCG tablet Take 1 tablet (50 mcg total) by mouth daily.    losartan (COZAAR) 50 MG tablet TAKE 1 TABLET BY MOUTH  TWICE DAILY     Multiple Vitamins-Minerals (PRESERVISION AREDS 2+MULTI VIT) CAPS Take 1 capsule by mouth in the morning and at bedtime.    omeprazole (PRILOSEC) 20 MG capsule Take 20 mg by mouth 2 (two) times daily before a meal.     Polyethylene Glycol 400 (BLINK TEARS) 0.25 % GEL Place 1-2 drops into both eyes as needed (dry eyes). 01/29/2020: Medication at bedside   Polyethylene Glycol 400 (BLINK TEARS) 0.25 % SOLN Apply to eye.    sennosides-docusate sodium (SENOKOT-S) 8.6-50 MG tablet Take 1-2 tablets by mouth at bedtime.     terbinafine (LAMISIL) 1 % cream Apply 1 application topically 2 (two) times daily as needed (toes).    WIXELA INHUB 250-50 MCG/ACT AEPB INHALE 1 INHALATION BY  MOUTH INTO THE LUNGS IN THE MORNING AND AT BEDTIME    No facility-administered encounter medications on file as of 08/24/2021.   GEN CALL: Spoke with the patient about the new pharmacist Alena Bills, she voiced understanding. We discussed her medications and the patient stated currently she does not have any questions or concerns about her medications at this time. She stated she is having some GI issues but she is currently seeing her specialist about it and she is also taking metamucil.   Care Gaps:None.  Star Rating Drugs:Losartan 50 mg 06/20/21 90 DS.  Follow-Up:Pharmacist Review  Charlann Lange, RMA Clinical Pharmacist Assistant [phone]  5 minutes spent in review, coordination, and documentation.  Reviewed by: Alena Bills, PharmD Clinical Pharmacist [phone]

## 2021-08-31 DIAGNOSIS — H353210 Exudative age-related macular degeneration, right eye, stage unspecified: Secondary | ICD-10-CM | POA: Diagnosis not present

## 2021-09-04 ENCOUNTER — Other Ambulatory Visit: Payer: Self-pay | Admitting: Internal Medicine

## 2021-09-04 DIAGNOSIS — F411 Generalized anxiety disorder: Secondary | ICD-10-CM

## 2021-09-06 ENCOUNTER — Other Ambulatory Visit: Payer: Self-pay

## 2021-09-06 ENCOUNTER — Telehealth: Payer: Self-pay | Admitting: Student-PharmD

## 2021-09-06 DIAGNOSIS — I10 Essential (primary) hypertension: Secondary | ICD-10-CM

## 2021-09-06 MED ORDER — CLONIDINE HCL 0.1 MG PO TABS: ORAL_TABLET | ORAL | 0 refills | Status: DC

## 2021-09-06 NOTE — Telephone Encounter (Signed)
  Chronic Care Management Pharmacy Assistant   Name: Pam Cruz  MRN: [id number] DOB: 09-Feb-1935  Reason for Encounter: Via Telephone: Patient called stating she is going to run out of Clonidine over Thanksgiving holiday, as it will not arrive in time from mail order. Her RPM systolic readings show numerous values over 180 and patient states this is probably because she has been trying to make her Clonidine last.  -Sent urgent message to staff CMAs requesting refill be sent to her local [place]. However, office closed early for holiday. -Instructed patient to call [place] office and leave a message with On Call Provider which she has done. -Instructed patient to please seek medical attention at urgent care or ER if BP continues to be elevated over 575 systolic.  F/U on Monday (11/28) morning with call.  Alena Bills Clinical Pharmacist [phone]

## 2021-09-13 ENCOUNTER — Ambulatory Visit: Payer: Self-pay | Admitting: Student-PharmD

## 2021-09-13 ENCOUNTER — Telehealth: Payer: Self-pay | Admitting: Student-PharmD

## 2021-09-13 DIAGNOSIS — I10 Essential (primary) hypertension: Secondary | ICD-10-CM | POA: Diagnosis not present

## 2021-09-13 DIAGNOSIS — F419 Anxiety disorder, unspecified: Secondary | ICD-10-CM

## 2021-09-13 DIAGNOSIS — J449 Chronic obstructive pulmonary disease, unspecified: Secondary | ICD-10-CM | POA: Diagnosis not present

## 2021-09-13 DIAGNOSIS — K219 Gastro-esophageal reflux disease without esophagitis: Secondary | ICD-10-CM | POA: Diagnosis not present

## 2021-09-13 NOTE — Progress Notes (Signed)
Updated blood pressure in chart with patient-reported RPM home BP:  BP Readings from Last 3 Encounters:  09/12/21 120/68  05/11/21 140/86  03/21/21 140/80    Care plan remains the same. BP has resumed normal status now that patient is taking clonidine as prescribed.  Pam Cruz, [facility] Clinical Pharmacist [phone]

## 2021-09-13 NOTE — Chronic Care Management (AMB) (Signed)
Chronic Care Management   Pharmacy RPM Note  09/13/2021 Name: Pam Cruz MRN: [id number] DOB: 11/19/1934  Referred by: Pam Guise, MD Reason for referral : RPM Monthly Review    [name]  Name: Pam Cruz  Date of Birth: 04-15-1935  Gender: Female  Primary provider: [name]  RPM Coordinator: Charlann Lange  ICD Code: I10 Device ID: [id number] [place]: iBP Measurement Type: blood pressure Days of Monitoring: 16 Documentation Time: 1 Hr 20 Min 0 Sec BLOOD PRESSURE  Measurement Date: 09/12/2021 Tuesday at 27:25 PM Systolic / Diastolic (mmHg): 366 / 68 Measurement Date: 09/12/2021 Tuesday at 44:03 PM Systolic / Diastolic (mmHg): 474 / 87 Measurement Date: 09/08/2021 Friday at 25:95 PM Systolic / Diastolic (mmHg): 638 / 85 Measurement Date: 09/08/2021 Friday at 75:64 PM Systolic / Diastolic (mmHg): 332 / 83 Measurement Date: 09/07/2021 Thursday at 95:18 PM Systolic / Diastolic (mmHg): 841 / 660 Measurement Date: 09/07/2021 Thursday at 63:01 PM Systolic / Diastolic (mmHg): 601 / 093 Measurement Date: 09/07/2021 Thursday at 23:55 PM Systolic / Diastolic (mmHg): 732 / 202 Measurement Date: 09/07/2021 Thursday at 54:27 PM Systolic / Diastolic (mmHg): 062 / 86 Measurement Date: 09/06/2021 Wednesday at 37:62 PM Systolic / Diastolic (mmHg): 831 / 83 Measurement Date: 09/06/2021 Wednesday at 51:76 PM Systolic / Diastolic (mmHg): 160 / 85 Measurement Date: 09/06/2021 Wednesday at 73:71 PM Systolic / Diastolic (mmHg): 062 / 98 Measurement Date: 09/05/2021 Tuesday at 69:48 PM Systolic / Diastolic (mmHg): 546 / 93 Measurement Date: 09/05/2021 Tuesday at 27:03 PM Systolic / Diastolic (mmHg): 500 / 94 Measurement Date: 09/02/2021 Saturday at 93:81 PM Systolic / Diastolic (mmHg): 829 / 79 Measurement Date: 09/02/2021 Saturday at 93:71 PM Systolic / Diastolic (mmHg): 696 / 97 Measurement Date: 08/30/2021 Wednesday at 78:93 PM Systolic / Diastolic (mmHg): 810 /  73 Measurement Date: 08/30/2021 Wednesday at 17:51 PM Systolic / Diastolic (mmHg): 025 / 88 Measurement Date: 08/29/2021 Tuesday at 85:27 PM Systolic / Diastolic (mmHg): 782 / 85 Measurement Date: 08/29/2021 Tuesday at 42:35 PM Systolic / Diastolic (mmHg): 361 / 82 Measurement Date: 08/28/2021 Monday at 44:31 PM Systolic / Diastolic (mmHg): 540 / 66 Measurement Date: 08/28/2021 Monday at 08:67 PM Systolic / Diastolic (mmHg): 619 / 99 Measurement Date: 08/26/2021 Saturday at 50:93 PM Systolic / Diastolic (mmHg): 267 / 74 Measurement Date: 08/26/2021 Saturday at 12:45 PM Systolic / Diastolic (mmHg): 809 / 78 Measurement Date: 08/23/2021 Wednesday at 98:33 PM Systolic / Diastolic (mmHg): 825 / 80 Measurement Date: 08/23/2021 Wednesday at 05:39 PM Systolic / Diastolic (mmHg): 767 / 94 Measurement Date: 08/21/2021 Monday at 34:19 PM Systolic / Diastolic (mmHg): 379 / 69 Measurement Date: 08/21/2021 Monday at 02:40 PM Systolic / Diastolic (mmHg): 973 / 83 Measurement Date: 08/20/2021 Sunday at 53:29 PM Systolic / Diastolic (mmHg): 924 / 88 Measurement Date: 08/20/2021 Sunday at 26:83 PM Systolic / Diastolic (mmHg): 419 / 84 Measurement Date: 08/18/2021 Friday at 62:22 PM Systolic / Diastolic (mmHg): 979 / 83 Measurement Date: 08/18/2021 Friday at 89:21 PM Systolic / Diastolic (mmHg): 194 / 88 Measurement Date: 08/17/2021 Thursday at 17:40 PM Systolic / Diastolic (mmHg): 814 / 76 Measurement Date: 08/17/2021 Thursday at 48:18 PM Systolic / Diastolic (mmHg): 563 / 73 Measurement Date: 08/15/2021 Tuesday at 14:97 AM Systolic / Diastolic (mmHg): 026 / 85  PULSE  Measurement Date: 09/12/2021 Tuesday at 11:45 PM Pulse (IN BPM): 58 Measurement Date: 09/12/2021 Tuesday at 05:16 PM Pulse (IN BPM): 68 Measurement Date: 09/08/2021 Friday at 11:38 PM Pulse (IN BPM): 59  Measurement Date: 09/08/2021 Friday at 05:27 PM Pulse (IN BPM): 59 Measurement Date: 09/07/2021 Thursday at 11:00 PM Pulse (IN BPM):  57 Measurement Date: 09/07/2021 Thursday at 10:51 PM Pulse (IN BPM): 62 Measurement Date: 09/07/2021 Thursday at 10:42 PM Pulse (IN BPM): 65 Measurement Date: 09/07/2021 Thursday at 05:44 PM Pulse (IN BPM): 67 Measurement Date: 09/06/2021 Wednesday at 09:53 PM Pulse (IN BPM): 62 Measurement Date: 09/06/2021 Wednesday at 09:46 PM Pulse (IN BPM): 61 Measurement Date: 09/06/2021 Wednesday at 02:09 PM Pulse (IN BPM): 77 Measurement Date: 09/05/2021 Tuesday at 11:25 PM Pulse (IN BPM): 67 Measurement Date: 09/05/2021 Tuesday at 05:38 PM Pulse (IN BPM): 62 Measurement Date: 09/02/2021 Saturday at 11:52 PM Pulse (IN BPM): 61 Measurement Date: 09/02/2021 Saturday at 05:41 PM Pulse (IN BPM): 63 Measurement Date: 08/30/2021 Wednesday at 10:14 PM Pulse (IN BPM): 67 Measurement Date: 08/30/2021 Wednesday at 03:08 PM Pulse (IN BPM): 67 Measurement Date: 08/29/2021 Tuesday at 11:51 PM Pulse (IN BPM): 67 Measurement Date: 08/29/2021 Tuesday at 03:53 PM Pulse (IN BPM): 66 Measurement Date: 08/28/2021 Monday at 11:54 PM Pulse (IN BPM): 68 Measurement Date: 08/28/2021 Monday at 04:59 PM Pulse (IN BPM): 64 Measurement Date: 08/26/2021 Saturday at 11:48 PM Pulse (IN BPM): 63 Measurement Date: 08/26/2021 Saturday at 03:32 PM Pulse (IN BPM): 73 Measurement Date: 08/23/2021 Wednesday at 10:59 PM Pulse (IN BPM): 59 Measurement Date: 08/23/2021 Wednesday at 05:52 PM Pulse (IN BPM): 68 Measurement Date: 08/21/2021 Monday at 11:52 PM Pulse (IN BPM): 61 Measurement Date: 08/21/2021 Monday at 05:12 PM Pulse (IN BPM): 61 Measurement Date: 08/20/2021 Sunday at 11:34 PM Pulse (IN BPM): 64 Measurement Date: 08/20/2021 Sunday at 05:39 PM Pulse (IN BPM): 71 Measurement Date: 08/18/2021 Friday at 11:25 PM Pulse (IN BPM): 66 Measurement Date: 08/18/2021 Friday at 04:10 PM Pulse (IN BPM): 66 Measurement Date: 08/17/2021 Thursday at 11:08 PM Pulse (IN BPM): 61 Measurement Date: 08/17/2021 Thursday at 02:58 PM Pulse (IN BPM):  74 Measurement Date: 08/15/2021 Tuesday at 12:31 AM Pulse (IN BPM): 60        Notes for November-2022 : 1 Hr 20 Min 0 Sec  Date and Time: 09/13/2021 Wednesday at 11:08 AM User: Alena Bills Time logged: 05:00 Notes: DATA REVIEW SYSTOLIC BP  Automatically transmitted data is reviewed today from device. (ID: [id number]).  Systolic BP reading is >967 for 17.65 % of time  Systolic BP reading is between 160 to 179 for 20.59 % of time  Systolic BP reading is between 140 to 159 for 44.12 % of time  Systolic BP reading is between 120-139 for 14.71 % of time  Systolic BP reading is between 101-120 for 5.88 % of time  Systolic BP reading is <591 for 0 % of time  DIASTOLIC BP  Diastolic BP reading is elevated >100 for 8.82 % of time  Diastolic BP reading is between 90-99 for 17.65 % of time  PULSE  Pulse is >120 for 0 % of time  Pulse is <50 for 0 % of time  COMPLIANCE NOTES BLOOD PRESSURE  Patient is compliant - 16 days of readings obtained.  MANAGEMENT NOTES We will continue to monitor BP readings for now  Medications for BP reviewed for this patient  Extremely high BP numbers noted; addressed with patient; she was out of medication; has appt with PCP on 12/6  No change on the treatment plan   Date and Time: 09/13/2021 Wednesday at 10:52 AM User: Charlann Lange Time logged: 04:00 Notes: COMPLIANCE NOTES BLOOD PRESSURE  Patient is compliant -  16 days of readings obtained.  REVIEW NOTES All the daily Blood pressure data noted since last review  Heart rate/Pulse data reviewed  Diastolic BP readings are elevated at times  Systolic BP elevated at times   Date and Time: 09/12/2021 Tuesday at 10:12 AM User: Charlann Lange Time logged: 23:00 Notes: Spoke with the patient about her high blood pressure readings.   Date and Time: 09/11/2021 Monday at 09:06 AM User: Charlann Lange Time logged: 14:00 Notes: REVIEW NOTES All the  daily Blood pressure data noted since last review  Heart rate/Pulse data reviewed  Systolic BP elevated at times  Diastolic BP readings are elevated at times   Date and Time: 09/06/2021 Wednesday at 03:58 PM User: Charlann Lange Time logged: 06:00 Notes: REVIEW NOTES All the daily Blood pressure data noted since last review  Heart rate/Pulse data reviewed   Date and Time: 09/04/2021 Monday at 03:19 PM User: Charlann Lange Time logged: 06:00 Notes: REVIEW NOTES All the daily Blood pressure data noted since last review  Heart rate/Pulse data reviewed   Date and Time: 08/30/2021 Wednesday at 04:09 PM User: Charlann Lange Time logged: 02:00 Notes: REVIEW NOTES All the daily Blood pressure data noted since last review  Heart rate/Pulse data reviewed   Date and Time: 08/29/2021 Tuesday at 04:25 PM User: Charlann Lange Time logged: 02:00 Notes: REVIEW NOTES All the daily Blood pressure data noted since last review  Heart rate/Pulse data reviewed   Date and Time: 08/28/2021 Monday at 04:37 PM User: Charlann Lange Time logged: 08:00 Notes: REVIEW NOTES All the daily Blood pressure data noted since last review  Heart rate/Pulse data reviewed   Date and Time: 08/21/2021 Monday at 04:25 PM User: Charlann Lange Time logged: 04:00 Notes: REVIEW NOTES All the daily Blood pressure data noted since last review  Heart rate/Pulse data reviewed   Date and Time: 08/18/2021 Friday at 12:01 PM User: Charlann Lange Time logged: 04:00 Notes: REVIEW NOTES All the daily Blood pressure data noted since last review  Heart rate/Pulse data reviewed   Date and Time: 08/15/2021 Tuesday at 04:40 PM User: Charlann Lange Time logged: 02:00 Notes: REVIEW NOTES All the daily Blood pressure data noted since last review  Heart rate/Pulse data reviewed  Verbal Consent Obtained on April 12, 2021. Patient was notified about Remote Patient Monitoring  (RPM) program offered by our medical practice to improve the patient care, co-ordination of care, patient education, provide quality of care, reduce the cost and provide personalized care. Patient was informed that this is Medicare approved program and can be furnished by only one provider, can be stopped by the patient anytime. Patient has verbally consented to enroll in this program and our designated care coordinator to contact by phone as needed at least for 20 minutes a month. Practice also provides 24/7 care to the patient needs. All patient questions were answered and patient was enrolled in the RPM program.

## 2021-09-19 ENCOUNTER — Other Ambulatory Visit: Payer: Self-pay

## 2021-09-19 ENCOUNTER — Ambulatory Visit (INDEPENDENT_AMBULATORY_CARE_PROVIDER_SITE_OTHER): Payer: Medicare Other | Admitting: Nurse Practitioner

## 2021-09-19 ENCOUNTER — Encounter: Payer: Self-pay | Admitting: Nurse Practitioner

## 2021-09-19 VITALS — BP 140/70 | HR 63 | Temp 98.5°F | Resp 16 | Ht <= 58 in | Wt 74.6 lb

## 2021-09-19 DIAGNOSIS — I10 Essential (primary) hypertension: Secondary | ICD-10-CM

## 2021-09-19 DIAGNOSIS — Z23 Encounter for immunization: Secondary | ICD-10-CM

## 2021-09-19 DIAGNOSIS — J449 Chronic obstructive pulmonary disease, unspecified: Secondary | ICD-10-CM

## 2021-09-19 DIAGNOSIS — K219 Gastro-esophageal reflux disease without esophagitis: Secondary | ICD-10-CM

## 2021-09-19 DIAGNOSIS — L659 Nonscarring hair loss, unspecified: Secondary | ICD-10-CM | POA: Diagnosis not present

## 2021-09-19 MED ORDER — TETANUS-DIPHTH-ACELL PERTUSSIS 5-2-15.5 LF-MCG/0.5 IM SUSP: 0.5000 mL | Freq: Once | INTRAMUSCULAR | 0 refills | Status: DC

## 2021-09-19 MED ORDER — TETANUS-DIPHTH-ACELL PERTUSSIS 5-2.5-18.5 LF-MCG/0.5 IM SUSP: 0.5000 mL | Freq: Once | INTRAMUSCULAR | 0 refills | Status: AC

## 2021-09-19 NOTE — Progress Notes (Signed)
[facility] [place], [place] [address]  Internal MEDICINE  Office Visit Note  Patient Name: Pam Cruz  [id number]  [id number]  Date of Service: 09/19/2021  Chief Complaint  Patient presents with   Follow-up    Discuss meds, hair on arms, hair on head has a different texture and color     HPI Laycee presents for a follow up visit for medication review, hair thinning and texture change. She has hypertension and takes losartan and hydralazine. Her blood pressure is still slightly elevated but patient is not interested in changing her BP medications at all. She declined the shingles vaccine. May need to request vaccination records from [facility]. She is due for the tetanus vaccine.  She has COPD and is stable with wixela. She also has GERD and osteoporosis.   Current Medication: Outpatient Encounter Medications as of 09/19/2021  Medication Sig Note   acetaminophen (TYLENOL) 500 MG tablet Take 500 mg by mouth every 6 (six) hours as needed (pain).     amLODipine (NORVASC) 2.5 MG tablet TAKE 1 TABLET BY MOUTH  DAILY    bimatoprost (LUMIGAN) 0.01 % SOLN INSTILL 1 DROP IN LEFT EYE AT BEDTIME    clonazePAM (KLONOPIN) 0.5 MG tablet TAKE 1 TABLET BY MOUTH  TWICE DAILY AS NEEDED FOR  ANXIETY    cloNIDine (CATAPRES) 0.1 MG tablet TAKE 1 TABLET BY MOUTH  TWICE DAILY MAY TAKE EXTRA  TABLET IF NEEDED (MAXIMUM 3 TABLETS A DAY)    desipramine (NORPRAMIN) 25 MG tablet Take 1 tablet (25 mg total) by mouth daily.    Homeopathic Products ([place]) FOAM Apply 2 application topically See admin instructions. Apply two applications to various areas of the body at bedtime according to bottle instructions 01/29/2020: Medication at bedside    hydrALAZINE (APRESOLINE) 25 MG tablet TAKE 1 TABLET BY MOUTH 3  TIMES DAILY    levothyroxine (SYNTHROID) 50 MCG tablet Take 1 tablet (50 mcg total) by mouth daily.    losartan (COZAAR) 50 MG tablet TAKE 1 TABLET BY MOUTH  TWICE DAILY     Multiple Vitamins-Minerals (PRESERVISION AREDS 2+MULTI VIT) CAPS Take 1 capsule by mouth in the morning and at bedtime.    omeprazole (PRILOSEC) 20 MG capsule Take 20 mg by mouth 2 (two) times daily before a meal.     Polyethylene Glycol 400 (BLINK TEARS) 0.25 % GEL Place 1-2 drops into both eyes as needed (dry eyes). 01/29/2020: Medication at bedside   Polyethylene Glycol 400 (BLINK TEARS) 0.25 % SOLN Apply to eye.    sennosides-docusate sodium (SENOKOT-S) 8.6-50 MG tablet Take 1-2 tablets by mouth at bedtime.     terbinafine (LAMISIL) 1 % cream Apply 1 application topically 2 (two) times daily as needed (toes).    WIXELA INHUB 250-50 MCG/ACT AEPB INHALE 1 INHALATION BY  MOUTH INTO THE LUNGS IN THE MORNING AND AT BEDTIME    [DISCONTINUED] Tdap (ADACEL) 02-13-14.5 LF-MCG/0.5 injection Inject 0.5 mLs into the muscle once for 1 dose.    [DISCONTINUED] Tdap (BOOSTRIX) 5-2.5-18.5 LF-MCG/0.5 injection Inject 0.5 mLs into the muscle once.    [DISCONTINUED] clonazePAM (KLONOPIN) 0.5 MG tablet Take 1 tablet (0.5 mg total) by mouth 2 (two) times daily as needed. for anxiety    [DISCONTINUED] cloNIDine (CATAPRES) 0.1 MG tablet Take 1 tablet (0.1 mg total) by mouth 2 (two) times daily. May take extra if needed    [DISCONTINUED] desipramine (NORPRAMIN) 25 MG tablet TAKE 1 TABLET BY MOUTH  DAILY    [  DISCONTINUED] Fluticasone-Salmeterol (ADVAIR) 250-50 MCG/DOSE AEPB Inhale 1 puff into the lungs 2 (two) times daily.    [DISCONTINUED] levothyroxine (SYNTHROID) 50 MCG tablet Take 1 tablet (50 mcg total) by mouth daily. 04/11/2021: Taking half tablet   No facility-administered encounter medications on file as of 09/19/2021.    Surgical History: Past Surgical History:  Procedure Laterality Date   COLONOSCOPY     cysto     LAPAROSCOPY     TOTAL HIP ARTHROPLASTY Right 08/17/2019   Procedure: TOTAL HIP ARTHROPLASTY;  Surgeon: Dereck Leep, MD;  Location: [facility] ORS;  Service: Orthopedics;  Laterality: Right;     Medical History: Past Medical History:  Diagnosis Date   Anxiety    Arthritis    COPD (chronic obstructive pulmonary disease) (HCC)    Dyspnea    Gastritis    GERD (gastroesophageal reflux disease)    Hypertension    Osteoporosis     Family History: Family History  Problem Relation Age of Onset   Hypertension Mother    Coronary artery disease Father    Heart disease Father     Social History   Socioeconomic History   Marital status: Single    Spouse name: Not on file   Number of children: Not on file   Years of education: Not on file   Highest education level: Not on file  Occupational History   Not on file  Tobacco Use   Smoking status: Former    Types: Cigarettes    Quit date: 02/20/2001    Years since quitting: 20.6   Smokeless tobacco: Never   Tobacco comments:    quit 17 years ago  Vaping Use   Vaping Use: Never used  Substance and Sexual Activity   Alcohol use: Yes    Alcohol/week: 2.0 standard drinks    Types: 2 Glasses of wine per week    Comment: very rarely   Drug use: Never   Sexual activity: Not on file  Other Topics Concern   Not on file  Social History Narrative   Not on file   Social Determinants of Health   Financial Resource Strain: Low Risk    Difficulty of Paying Living Expenses: Not very hard  Food Insecurity: Not on file  Transportation Needs: Not on file  Physical Activity: Not on file  Stress: Not on file  Social Connections: Not on file  Intimate Partner Violence: Not on file      Review of Systems  Constitutional:  Negative for chills, fatigue and unexpected weight change.  HENT:  Negative for congestion, rhinorrhea, sneezing and sore throat.        Hair loss, thinning and change in color and texture.   Eyes:  Negative for redness.  Respiratory:  Negative for cough, chest tightness and shortness of breath.   Cardiovascular:  Negative for chest pain and palpitations.  Gastrointestinal:  Negative for abdominal pain,  constipation, diarrhea, nausea and vomiting.  Genitourinary:  Negative for dysuria and frequency.  Musculoskeletal:  Negative for arthralgias, back pain, joint swelling and neck pain.  Skin:  Negative for rash.  Neurological: Negative.  Negative for tremors and numbness.  Hematological:  Negative for adenopathy. Does not bruise/bleed easily.  Psychiatric/Behavioral:  Negative for behavioral problems (Depression), sleep disturbance and suicidal ideas. The patient is not nervous/anxious.    Vital Signs: BP 140/70 Comment: 149/74  Pulse 63   Temp 98.5 F (36.9 C)   Resp 16   Ht 4\' 8"  (1.422 m)  Wt 74 lb 9.6 oz (33.8 kg)   SpO2 98%   BMI 16.72 kg/m    Physical Exam Vitals reviewed.  Constitutional:      General: She is awake. She is not in acute distress.    Appearance: Normal appearance. She is well-developed, well-groomed and underweight. She is not ill-appearing.  HENT:     Head: Normocephalic and atraumatic.  Eyes:     Pupils: Pupils are equal, round, and reactive to light.  Cardiovascular:     Rate and Rhythm: Normal rate and regular rhythm.  Pulmonary:     Effort: Pulmonary effort is normal. No respiratory distress.  Neurological:     Mental Status: She is alert and oriented to person, place, and time.     Cranial Nerves: No cranial nerve deficit.     Coordination: Coordination normal.     Gait: Gait normal.  Psychiatric:        Mood and Affect: Mood normal.        Behavior: Behavior normal. Behavior is cooperative.       Assessment/Plan: 1. Hair thinning Patient encouraged to try multivitamin such as hair skin and nails or other multivitamin with biotin in it.   2. Essential hypertension Blood pressure is slightly elevated and an adjustment in her BP meds could help but patient is not interested in having her medications adjusted at this time.   3. Chronic obstructive pulmonary disease, unspecified COPD type ([place]) Currently stable, maintenance inhaler is  wixela.   4. Gastroesophageal reflux disease without esophagitis Stable. Takes omeprazole.   5. Need for vaccination Tetanus vaccine ordered and sent to her pharmacy.     General Counseling: vale peraza understanding of the findings of todays visit and agrees with plan of treatment. I have discussed any further diagnostic evaluation that may be needed or ordered today. We also reviewed her medications today. she has been encouraged to call the office with any questions or concerns that should arise related to todays visit.    No orders of the defined types were placed in this encounter.   Meds ordered this encounter  Medications   DISCONTD: Tdap (ADACEL) 02-13-14.5 LF-MCG/0.5 injection    Sig: Inject 0.5 mLs into the muscle once for 1 dose.    Dispense:  0.5 mL    Refill:  0    Return in about 6 months (around 03/20/2022) for CPE, Diandra Cimini PCP.   Total time spent:30 Minutes Time spent includes review of chart, medications, test results, and follow up plan with the patient.   [place] Controlled Substance Database was reviewed by me.  This patient was seen by Jonetta Osgood, FNP-C in collaboration with Dr. [name] as a part of collaborative care agreement.   Yates Weisgerber R. Valetta Fuller, MSN, FNP-C Internal medicine

## 2021-10-13 ENCOUNTER — Telehealth: Payer: Self-pay | Admitting: Student-PharmD

## 2021-10-13 DIAGNOSIS — J449 Chronic obstructive pulmonary disease, unspecified: Secondary | ICD-10-CM | POA: Diagnosis not present

## 2021-10-13 DIAGNOSIS — K219 Gastro-esophageal reflux disease without esophagitis: Secondary | ICD-10-CM | POA: Diagnosis not present

## 2021-10-13 DIAGNOSIS — I10 Essential (primary) hypertension: Secondary | ICD-10-CM | POA: Diagnosis not present

## 2021-10-13 NOTE — Chronic Care Management (AMB) (Signed)
Chronic Care Management   Pharmacy RPM Note  10/13/2021 Name: Pam Cruz MRN: [id number] DOB: 05/25/1935  Referred by: Lavera Guise, MD Reason for referral : RPM Monthly Review      [place]  Name: Pam Cruz  Date of Birth: 28-Sep-1935  Gender: Female  Primary provider: [name]  RPM Coordinator: Charlann Lange  ICD Code: I10 Device ID: [id number] [place]: iBP Measurement Type: blood pressure Days of Monitoring: 16 Documentation Time: 54 Min 0 Sec BLOOD PRESSURE  Measurement Date: 10/13/2021 Friday at 80:03 PM Systolic / Diastolic (mmHg): 491 / 82 Measurement Date: 10/12/2021 Thursday at 79:15 PM Systolic / Diastolic (mmHg): 056 / 68 Measurement Date: 10/12/2021 Thursday at 97:94 PM Systolic / Diastolic (mmHg): 801 / 89 Measurement Date: 10/11/2021 Wednesday at 65:53 PM Systolic / Diastolic (mmHg): 748 / 79 Measurement Date: 10/11/2021 Wednesday at 27:07 PM Systolic / Diastolic (mmHg): 867 / 88 Measurement Date: 10/09/2021 Monday at 54:49 PM Systolic / Diastolic (mmHg): 201 / 81 Measurement Date: 10/09/2021 Monday at 00:71 PM Systolic / Diastolic (mmHg): 219 / 68 Measurement Date: 10/06/2021 Friday at 75:88 PM Systolic / Diastolic (mmHg): 325 / 86 Measurement Date: 10/06/2021 Friday at 49:82 PM Systolic / Diastolic (mmHg): 641 / 86 Measurement Date: 10/04/2021 Wednesday at 58:30 PM Systolic / Diastolic (mmHg): 940 / 88 Measurement Date: 10/04/2021 Wednesday at 76:80 AM Systolic / Diastolic (mmHg): 881 / 70 Measurement Date: 10/03/2021 Tuesday at 10:31 PM Systolic / Diastolic (mmHg): 594 / 78 Measurement Date: 10/03/2021 Tuesday at 58:59 PM Systolic / Diastolic (mmHg): 292 / 66 Measurement Date: 10/02/2021 Monday at 44:62 PM Systolic / Diastolic (mmHg): 863 / 74 Measurement Date: 10/02/2021 Monday at 81:77 PM Systolic / Diastolic (mmHg): 116 / 84 Measurement Date: 09/28/2021 Thursday at 57:90 PM Systolic / Diastolic (mmHg): 383 / 68 Measurement  Date: 09/28/2021 Thursday at 33:83 PM Systolic / Diastolic (mmHg): 291 / 85 Measurement Date: 09/26/2021 Tuesday at 91:66 PM Systolic / Diastolic (mmHg): 060 / 87 Measurement Date: 09/26/2021 Tuesday at 04:59 PM Systolic / Diastolic (mmHg): 977 / 74 Measurement Date: 09/25/2021 Monday at 41:42 PM Systolic / Diastolic (mmHg): 395 / 82 Measurement Date: 09/25/2021 Monday at 32:02 PM Systolic / Diastolic (mmHg): 334 / 84 Measurement Date: 09/23/2021 Saturday at 35:68 PM Systolic / Diastolic (mmHg): 616 / 79 Measurement Date: 09/23/2021 Saturday at 83:72 PM Systolic / Diastolic (mmHg): 902 / 66 Measurement Date: 09/22/2021 Friday at 11:15 PM Systolic / Diastolic (mmHg): 520 / 70 Measurement Date: 09/22/2021 Friday at 80:22 PM Systolic / Diastolic (mmHg): 336 / 74 Measurement Date: 09/21/2021 Thursday at 12:24 PM Systolic / Diastolic (mmHg): 497 / 77 Measurement Date: 09/21/2021 Thursday at 53:00 PM Systolic / Diastolic (mmHg): 511 / 82 Measurement Date: 09/20/2021 Wednesday at 02:11 PM Systolic / Diastolic (mmHg): 173 / 65 Measurement Date: 09/20/2021 Wednesday at 56:70 PM Systolic / Diastolic (mmHg): 141 / 72 Measurement Date: 09/17/2021 Sunday at 03:01 PM Systolic / Diastolic (mmHg): 314 / 80 Measurement Date: 09/17/2021 Sunday at 38:88 PM Systolic / Diastolic (mmHg): 757 / 66  PULSE  Measurement Date: 10/13/2021 Friday at 04:34 PM Pulse (IN BPM): 67 Measurement Date: 10/12/2021 Thursday at 11:42 PM Pulse (IN BPM): 63 Measurement Date: 10/12/2021 Thursday at 06:29 PM Pulse (IN BPM): 68 Measurement Date: 10/11/2021 Wednesday at 11:28 PM Pulse (IN BPM): 62 Measurement Date: 10/11/2021 Wednesday at 02:37 PM Pulse (IN BPM): 64 Measurement Date: 10/09/2021 Monday at 11:26 PM Pulse (IN BPM): 62 Measurement Date: 10/09/2021 Monday at 05:02 PM Pulse (IN  BPM): 61 Measurement Date: 10/06/2021 Friday at 10:47 PM Pulse (IN BPM): 55 Measurement Date: 10/06/2021 Friday at 01:15 PM Pulse (IN BPM):  53 Measurement Date: 10/04/2021 Wednesday at 11:30 PM Pulse (IN BPM): 53 Measurement Date: 10/04/2021 Wednesday at 11:47 AM Pulse (IN BPM): 69 Measurement Date: 10/03/2021 Tuesday at 11:43 PM Pulse (IN BPM): 59 Measurement Date: 10/03/2021 Tuesday at 03:23 PM Pulse (IN BPM): 63 Measurement Date: 10/02/2021 Monday at 11:47 PM Pulse (IN BPM): 60 Measurement Date: 10/02/2021 Monday at 04:52 PM Pulse (IN BPM): 64 Measurement Date: 09/28/2021 Thursday at 11:28 PM Pulse (IN BPM): 59 Measurement Date: 09/28/2021 Thursday at 05:23 PM Pulse (IN BPM): 62 Measurement Date: 09/26/2021 Tuesday at 11:44 PM Pulse (IN BPM): 58 Measurement Date: 09/26/2021 Tuesday at 03:03 PM Pulse (IN BPM): 63 Measurement Date: 09/25/2021 Monday at 11:46 PM Pulse (IN BPM): 60 Measurement Date: 09/25/2021 Monday at 03:29 PM Pulse (IN BPM): 55 Measurement Date: 09/23/2021 Saturday at 11:41 PM Pulse (IN BPM): 62 Measurement Date: 09/23/2021 Saturday at 04:07 PM Pulse (IN BPM): 64 Measurement Date: 09/22/2021 Friday at 11:39 PM Pulse (IN BPM): 62 Measurement Date: 09/22/2021 Friday at 06:33 PM Pulse (IN BPM): 66 Measurement Date: 09/21/2021 Thursday at 11:52 PM Pulse (IN BPM): 60 Measurement Date: 09/21/2021 Thursday at 06:34 PM Pulse (IN BPM): 64 Measurement Date: 09/20/2021 Wednesday at 11:34 PM Pulse (IN BPM): 59 Measurement Date: 09/20/2021 Wednesday at 06:25 PM Pulse (IN BPM): 64 Measurement Date: 09/17/2021 Sunday at 10:39 PM Pulse (IN BPM): 58 Measurement Date: 09/17/2021 Sunday at 05:52 PM Pulse (IN BPM): 64        Notes for December-2022 : 6 Min 0 Sec  Date and Time: 10/13/2021 Friday at 04:41 PM User: Alena Bills Time logged: 05:00 Notes: DATA REVIEW SYSTOLIC BP  Automatically transmitted data is reviewed today from device. (ID: [id number]).  Systolic BP reading is >914 for 0 % of time  Systolic BP reading is between 160 to 179 for 3.23 % of time  Systolic BP reading is between 140 to  159 for 61.29 % of time  Systolic BP reading is between 120-139 for 29.03 % of time  Systolic BP reading is between 101-120 for 9.68 % of time  Systolic BP reading is <782 for 0 % of time  DIASTOLIC BP  Diastolic BP reading is elevated >100 for 0 % of time  Diastolic BP reading is between 90-99 for 0 % of time  PULSE  Pulse is >120 for 0 % of time  Pulse is <50 for 0 % of time  COMPLIANCE NOTES BLOOD PRESSURE  Patient is compliant - 16 days of readings obtained.  MANAGEMENT NOTES We will continue to monitor BP readings for now  No change on the treatment plan   Date and Time: 10/13/2021 Friday at 12:03 PM User: Charlann Lange Time logged: 02:00 Notes: REVIEW NOTES All the daily Blood pressure data noted since last review  Heart rate/Pulse data reviewed   Date and Time: 10/12/2021 Thursday at 02:15 PM User: Charlann Lange Time logged: 02:00 Notes: REVIEW NOTES All the daily Blood pressure data noted since last review  Heart rate/Pulse data reviewed   Date and Time: 10/10/2021 Tuesday at 02:04 PM User: Charlann Lange Time logged: 08:00 Notes: REVIEW NOTES All the daily Blood pressure data noted since last review  Heart rate/Pulse data reviewed   Date and Time: 10/03/2021 Tuesday at 03:02 PM User: Charlann Lange Time logged: 04:00 Notes: REVIEW NOTES All the daily Blood pressure data noted since last review  Heart rate/Pulse data reviewed   Date and Time: 09/29/2021 Friday at 04:27 PM User: Charlann Lange Time logged: 04:00 Notes: System Generated Message - This is an edited note. Please see original note with Reference [id number]. REVIEW NOTES All the daily Blood pressure data noted since last review  Heart rate/Pulse data reviewed   Date and Time: 09/29/2021 Friday at 04:27 PM User: Charlann Lange Time logged: 00:00 Notes: System Generated Message (Reference [id number] ) - This note has been edited . REVIEW  NOTES All the daily Blood pressure data noted since last review  Heart rate/Pulse data reviewed   Date and Time: 09/27/2021 Wednesday at 02:10 PM User: Charlann Lange Time logged: 04:00 Notes: REVIEW NOTES All the daily Blood pressure data noted since last review  Heart rate/Pulse data reviewed   Date and Time: 09/25/2021 Monday at 04:21 PM User: Charlann Lange Time logged: 10:00 Notes: REVIEW NOTES All the daily Blood pressure data noted since last review  Heart rate/Pulse data reviewed   Date and Time: 09/21/2021 Thursday at 04:26 PM User: Charlann Lange Time logged: 04:00 Notes: REVIEW NOTES All the daily Blood pressure data noted since last review  Heart rate/Pulse data reviewed   Date and Time: 09/18/2021 Monday at 04:35 PM User: Charlann Lange Time logged: 04:00 Notes: REVIEW NOTES All the daily Blood pressure data noted since last review  Heart rate/Pulse data reviewed  Verbal Consent Obtained on April 12, 2021. Patient was notified about Remote Patient Monitoring (RPM) program offered by our medical practice to improve the patient care, co-ordination of care, patient education, provide quality of care, reduce the cost and provide personalized care. Patient was informed that this is Medicare approved program and can be furnished by only one provider, can be stopped by the patient anytime. Patient has verbally consented to enroll in this program and our designated care coordinator to contact by phone as needed at least for 20 minutes a month. Practice also provides 24/7 care to the patient needs. All patient questions were answered and patient was enrolled in the RPM program.  5 minutes spent in review, coordination, and documentation.  Reviewed by: Alena Bills, PharmD Clinical Pharmacist [phone]

## 2021-10-18 ENCOUNTER — Telehealth: Payer: Medicare Other

## 2021-10-20 ENCOUNTER — Other Ambulatory Visit: Payer: Self-pay

## 2021-10-20 ENCOUNTER — Ambulatory Visit: Payer: Medicare Other | Admitting: Student-PharmD

## 2021-10-20 DIAGNOSIS — I10 Essential (primary) hypertension: Secondary | ICD-10-CM

## 2021-10-20 DIAGNOSIS — J449 Chronic obstructive pulmonary disease, unspecified: Secondary | ICD-10-CM

## 2021-10-20 NOTE — Progress Notes (Signed)
Follow Up Pharmacist Visit   Pam Cruz, Pam Cruz [id number] 86 years, Female  DOB: Jun 10, 1935  M: [phone]  Clinical Summary Situation:: Patient presented via telephone call for CCM follow up visit. Background:: Patient was recently having issues with blood pressure being elevated (had been taking less clonidine than prescribed). Assessment:: Patient has been taking blood pressure medication as prescribed. Has an at home BP monitoring device and BP is currently well controlled. Recommendations:: Continue blood pressure medications and at home blood pressure monitoring.  Patient Chart Prep Completed by Charlann Lange on 10/17/2021  Chronic Conditions Patient's Chronic Conditions: Hypertension (HTN), Chronic Obstructive Pulmonary Disease (COPD), Gastroesophageal Reflux Disease (GERD), Hypothyroidism, Anxiety  Doctor and Hospital Visits Were there PCP Visits since last visit with the Pharmacist?: Yes Visit #1: 09/19/21 Jonetta Osgood, NP. For follow-up. No medication changes. PER NOTE: Declined shingles vaccine. Were there Specialist Visits since last visit with the Pharmacist?: No Was there a Hospital Visit in last 30 days?: No Were there other Hospital Visits since last visit with the Pharmacist?: No  Medication Information Have there been any medication changes from PCP or Specialist since last visit with the Pharmacist?: No Are there any Medication adherence gaps (beyond 5 days past due)?: No Medication adherence rates for the STAR rating drugs: Losartan 50 mg 09/05/21 90 DS. List Patient's current Care Gaps: No current Care Gaps identified   Pre-Call Questions [facility]) Are you able to connect with Patient?: Yes Visit Type: Phone Call May we confirm what is the best phone # for the pharmacist to call you?: 10/20/21 at 1:45 PM Have you been in the hospital or emergency room recently?: No Do you have any questions or concerns that you want to make sure your pharmacist addresses  with your during your appointment?: No What, if any, problems do you have getting your medications from the pharmacy?: None Is there anything else that you would like me to pass along to your pharmacist or PCP?: No Patient reminded to bring medication bottles to visit (not a list of meds) OR have medication bottles pulled out prior to appointment time: Done  Disease Assessments  Subjective Information Current BP: 140/70 Current HR: 63 taken on: 09/19/2021 Weight: 16.72 BMI: 16.72 Last GFR: 48 taken on: 01/28/2020 Why did the patient present?: CCM F/u Visit Is Patient using [facility]?: No Name and location of Current pharmacy: [facility] Current Rx insurance plan: [employer] Are meds synced by current pharmacy?: Yes Are meds delivered by current pharmacy?: Yes - by mail order pharmacy Would patient benefit from direct intervention of clinical lead in dispensing process to optimize clinical outcomes?: Yes Are [facility] pharmacy services available where patient lives?: Yes Is patient disadvantaged to use [facility]?: Yes Does patient experience delays in picking up medications due to transportation concerns (getting to pharmacy)?: No  Hypertension (HTN) Assess this condition today?: Yes Is patient able to obtain BP reading today?: Yes BP today is: 130/77 Heart Rate is: 60 Goal: <130/80 mmHG Hypertension Stage: Stage 1 (SBP: 130-139 or DBP: 80-89) Is Patient checking BP at home?: Yes Patient home BP readings are ranging: 130-163/68-89 How often does patient miss taking their blood pressure medications?: None Has patient experienced hypotension, dizziness, falls or bradycardia?: No Does Patient use RPM device?: Yes We discussed: DASH diet:  following a diet emphasizing fruits and vegetables and low-fat dairy products along with whole grains, fish, poultry, and nuts. Reducing red meats and sugars., Targeting 150 minutes of aerobic activity per week, Proper Home BP  Measurement Assessment:: Controlled Drug: Amlodipine 2.5mg  1 tablet daily Assessment: Appropriate, Effective, Safe, Accessible Drug: Clonidine 0.1mg  1 tablet twice daily Assessment: Appropriate, Effective, Safe, Accessible Drug: Hydralazine 25mg  1 tablet three times a daily Assessment: Appropriate, Effective, Safe, Accessible Drug: Losartan 50mg  1 tablet twice daily Assessment: Appropriate, Effective, Safe, Accessible Plan to (other): Continue current medication therapy HC Follow up: 2 months Pharmacist Follow up: 5 months  Chronic Obstructive Pulmonary Disease (COPD) Current Eosinophils: 0.0 taken on: 10/06/2018 Assess this condition today?: Yes Gold group: A (low sx, < 2 exacerbations / yr) Exacerbations since last visit with pharmacist?: No Has there been change in patients smoking/vaping habit since last visit with pharmacist?: No Home oxygen therapy: No Frequency of SABA/SAMA use: Never We discussed: Inhaler technique Assessment:: Controlled Drug: Wixela 250-50 1 puff twice daily Assessment: Appropriate, Effective, Safe, Accessible Plan to (other): Continue current medication therapy HC Follow up: 2 months Pharmacist Follow up: 5 months  Exercise, Diet and Non-Drug Coordination Needs Additional exercise counseling points. We discussed: targeting at least 151 minutes per week of moderate-intensity aerobic exercise. Additional diet counseling points. We discussed: key components of the DASH diet, aiming to consume at least 8 cups of water day Discussed Non-Drug Care Coordination Needs: Yes Does Patient have Medication financial barriers?: No  Accountable Health Communities Health-Related Social Needs Screening Tool -  SDOH  What is your living situation today? (ref #1): I have a steady place to live Think about the place you live. Do you have problems with any of the following? (ref #2): None of the above Within the past 12 months, you worried that your food would run out  before you got money to buy more (ref #3): Never true Within the past 12 months, the food you bought just didn't last and you didn't have money to get more (ref #4): Never true In the past 12 months, has lack of reliable transportation kept you from medical appointments, meetings, work or from getting things needed for daily living? (ref #5): No In the past 12 months, has the electric, gas, oil, or water company threatened to shut off services in your home? (ref #6): No How often does anyone, including family and friends, physically hurt you? (ref #7): Never (1) How often does anyone, including family and friends, insult or talk down to you? (ref #8): Never (1) How often does anyone, including friends and family, threaten you with harm? (ref #9): Never (1) How often does anyone, including family and friends, scream or curse at you? (ref #10): Never (1)  High-Fiber Eating Plan Fiber, also called dietary fiber, is a type of carbohydrate. It is found foods such as fruits, vegetables, whole grains, and beans. A high-fiber diet can have many health benefits. Your health care provider may recommend a high-fiber diet to help: Prevent constipation. Fiber can make your bowel movements more regular. Lower your cholesterol. Relieve the following conditions: Inflammation of veins in the anus (hemorrhoids). Inflammation of specific areas of the digestive tract (uncomplicated diverticulosis). A problem of the large intestine, also called the colon, that sometimes causes pain and diarrhea (irritable bowel syndrome, or IBS). Prevent overeating as part of a weight-loss plan. Prevent heart disease, type 2 diabetes, and certain cancers. What are tips for following this plan? Reading food labels Check the nutrition facts label on food products for the amount of dietary fiber. Choose foods that have 5 grams of fiber or more per serving. The goals for recommended daily fiber intake include: Men (age 101  or younger):  34-38 g. Men (over age 74): 28-34 g. Women (age 80 or younger): 25-28 g. Women (over age 78): 22-25 g. Your daily fiber goal is _____________ g. Shopping Choose whole fruits and vegetables instead of processed forms, such as apple juice or applesauce. Choose a wide variety of high-fiber foods such as avocados, lentils, oats, and kidney beans. Read the nutrition facts label of the foods you choose. Be aware of foods with added fiber. These foods often have high sugar and sodium amounts per serving. Cooking Use whole-grain flour for baking and cooking. Cook with brown rice instead of white rice. Meal planning Start the day with a breakfast that is high in fiber, such as a cereal that contains 5 g of fiber or more per serving. Eat breads and cereals that are made with whole-grain flour instead of refined flour or white flour. Eat brown rice, bulgur wheat, or millet instead of white rice. Use beans in place of meat in soups, salads, and pasta dishes. Be sure that half of the grains you eat each day are whole grains. General information You can get the recommended daily intake of dietary fiber by: Eating a variety of fruits, vegetables, grains, nuts, and beans. Taking a fiber supplement if you are not able to take in enough fiber in your diet. It is better to get fiber through food than from a supplement. Gradually increase how much fiber you consume. If you increase your intake of dietary fiber too quickly, you may have bloating, cramping, or gas. Drink plenty of water to help you digest fiber. Choose high-fiber snacks, such as berries, raw vegetables, nuts, and popcorn. What foods should I eat? Fruits Berries. Pears. Apples. Oranges. Avocado. Prunes and raisins. Dried figs. Vegetables Sweet potatoes. Spinach. Kale. Artichokes. Cabbage. Broccoli. Cauliflower. Green peas. Carrots. Squash. Grains Whole-grain breads. Multigrain cereal. Oats and oatmeal. Brown rice. Barley. Bulgur wheat.  [place]. Quinoa. Bran muffins. Popcorn. Rye wafer crackers. Meats and other proteins Navy beans, kidney beans, and pinto beans. Soybeans. Split peas. Lentils. Nuts and seeds. Dairy Fiber-fortified yogurt. Beverages Fiber-fortified soy milk. Fiber-fortified orange juice. Other foods Fiber bars. The items listed above may not be a complete list of recommended foods and beverages. Contact a dietitian for more information. What foods should I avoid? Fruits Fruit juice. Cooked, strained fruit. Vegetables Fried potatoes. Canned vegetables. Well-cooked vegetables. Grains White bread. Pasta made with refined flour. White rice. Meats and other proteins Fatty cuts of meat. Fried chicken or fried fish. Dairy Milk. Yogurt. Cream cheese. Sour cream. Fats and oils Butters. Beverages Soft drinks. Other foods Cakes and pastries. The items listed above may not be a complete list of foods and beverages to avoid. Talk with your dietitian about what choices are best for you. Summary Fiber is a type of carbohydrate. It is found in foods such as fruits, vegetables, whole grains, and beans. A high-fiber diet has many benefits. It can help to prevent constipation, lower blood cholesterol, aid weight loss, and reduce your risk of heart disease, diabetes, and certain cancers. Increase your intake of fiber gradually. Increasing fiber too quickly may cause cramping, bloating, and gas. Drink plenty of water while you increase the amount of fiber you consume. The best sources of fiber include whole fruits and vegetables, whole grains, nuts, seeds, and beans. This information is not intended to replace advice given to you by your health care provider. Make sure you discuss any questions you have with your health care provider.  Care Plan:  Alena Bills Clinical Pharmacist  [phone]

## 2021-10-22 ENCOUNTER — Encounter: Payer: Self-pay | Admitting: Nurse Practitioner

## 2021-10-27 ENCOUNTER — Telehealth: Payer: Self-pay | Admitting: Student-PharmD

## 2021-10-27 NOTE — Progress Notes (Signed)
° °  Chronic Care Management Pharmacy Assistant   Name: LEANAH KOLANDER  MRN: [id number] DOB: [birth year]   Reason for Encounter:  CCM Care Plan  Medications: Outpatient Encounter Medications as of 10/27/2021  Medication Sig Note   acetaminophen (TYLENOL) 500 MG tablet Take 500 mg by mouth every 6 (six) hours as needed (pain).     amLODipine (NORVASC) 2.5 MG tablet TAKE 1 TABLET BY MOUTH  DAILY    bimatoprost (LUMIGAN) 0.01 % SOLN INSTILL 1 DROP IN LEFT EYE AT BEDTIME    clonazePAM (KLONOPIN) 0.5 MG tablet TAKE 1 TABLET BY MOUTH  TWICE DAILY AS NEEDED FOR  ANXIETY    cloNIDine (CATAPRES) 0.1 MG tablet TAKE 1 TABLET BY MOUTH  TWICE DAILY MAY TAKE EXTRA  TABLET IF NEEDED (MAXIMUM 3 TABLETS A DAY)    desipramine (NORPRAMIN) 25 MG tablet Take 1 tablet (25 mg total) by mouth daily.    Homeopathic Products ([place]) FOAM Apply 2 application topically See admin instructions. Apply two applications to various areas of the body at bedtime according to bottle instructions 01/29/2020: Medication at bedside    hydrALAZINE (APRESOLINE) 25 MG tablet TAKE 1 TABLET BY MOUTH 3  TIMES DAILY    levothyroxine (SYNTHROID) 50 MCG tablet Take 1 tablet (50 mcg total) by mouth daily.    losartan (COZAAR) 50 MG tablet TAKE 1 TABLET BY MOUTH  TWICE DAILY    Multiple Vitamins-Minerals (PRESERVISION AREDS 2+MULTI VIT) CAPS Take 1 capsule by mouth in the morning and at bedtime.    omeprazole (PRILOSEC) 20 MG capsule Take 20 mg by mouth 2 (two) times daily before a meal.     Polyethylene Glycol 400 (BLINK TEARS) 0.25 % GEL Place 1-2 drops into both eyes as needed (dry eyes). 01/29/2020: Medication at bedside   Polyethylene Glycol 400 (BLINK TEARS) 0.25 % SOLN Apply to eye.    sennosides-docusate sodium (SENOKOT-S) 8.6-50 MG tablet Take 1-2 tablets by mouth at bedtime.     terbinafine (LAMISIL) 1 % cream Apply 1 application topically 2 (two) times daily as needed (toes).    WIXELA INHUB 250-50 MCG/ACT AEPB INHALE 1  INHALATION BY  MOUTH INTO THE LUNGS IN THE MORNING AND AT BEDTIME    No facility-administered encounter medications on file as of 10/27/2021.    Reviewed the patients visit reinsured it was completed per the pharmacist Alena Bills request. Printed the CCM care plan. Mailed the patient CCM care plan to their most recent address on file.  Charlann Lange, [place] Clinical Pharmacist Assistant [phone]

## 2021-10-30 ENCOUNTER — Ambulatory Visit: Payer: Medicare Other | Admitting: Dermatology

## 2021-10-30 ENCOUNTER — Other Ambulatory Visit: Payer: Self-pay

## 2021-10-30 ENCOUNTER — Encounter: Payer: Self-pay | Admitting: Dermatology

## 2021-10-30 DIAGNOSIS — D492 Neoplasm of unspecified behavior of bone, soft tissue, and skin: Secondary | ICD-10-CM

## 2021-10-30 DIAGNOSIS — Z1283 Encounter for screening for malignant neoplasm of skin: Secondary | ICD-10-CM | POA: Diagnosis not present

## 2021-10-30 DIAGNOSIS — L578 Other skin changes due to chronic exposure to nonionizing radiation: Secondary | ICD-10-CM

## 2021-10-30 DIAGNOSIS — L814 Other melanin hyperpigmentation: Secondary | ICD-10-CM

## 2021-10-30 DIAGNOSIS — D692 Other nonthrombocytopenic purpura: Secondary | ICD-10-CM | POA: Diagnosis not present

## 2021-10-30 DIAGNOSIS — D229 Melanocytic nevi, unspecified: Secondary | ICD-10-CM

## 2021-10-30 DIAGNOSIS — L820 Inflamed seborrheic keratosis: Secondary | ICD-10-CM | POA: Diagnosis not present

## 2021-10-30 DIAGNOSIS — D1800 Hemangioma unspecified site: Secondary | ICD-10-CM

## 2021-10-30 DIAGNOSIS — L821 Other seborrheic keratosis: Secondary | ICD-10-CM

## 2021-10-30 NOTE — Patient Instructions (Addendum)
Melanoma ABCDEs  Melanoma is the most dangerous type of skin cancer, and is the leading cause of death from skin disease.  You are more likely to develop melanoma if you: Have light-colored skin, light-colored eyes, or red or blond hair Spend a lot of time in the sun Tan regularly, either outdoors or in a tanning bed Have had blistering sunburns, especially during childhood Have a close family member who has had a melanoma Have atypical moles or large birthmarks  Early detection of melanoma is key since treatment is typically straightforward and cure rates are extremely high if we catch it early.   The first sign of melanoma is often a change in a mole or a new dark spot.  The ABCDE system is a way of remembering the signs of melanoma.  A for asymmetry:  The two halves do not match. B for border:  The edges of the growth are irregular. C for color:  A mixture of colors are present instead of an even brown color. D for diameter:  Melanomas are usually (but not always) greater than 17mm - the size of a pencil eraser. E for evolution:  The spot keeps changing in size, shape, and color.  Please check your skin once per month between visits. You can use a small mirror in front and a large mirror behind you to keep an eye on the back side or your body.   If you see any new or changing lesions before your next follow-up, please call to schedule a visit.  Please continue daily skin protection including broad spectrum sunscreen SPF 30+ to sun-exposed areas, reapplying every 2 hours as needed when you're outdoors.   Staying in the shade or wearing long sleeves, sun glasses (UVA+UVB protection) and wide brim hats (4-inch brim around the entire circumference of the hat) are also recommended for sun protection.     Cryotherapy Aftercare  Wash gently with soap and water everyday.   Apply Vaseline and Band-Aid daily until healed.   Prior to procedure, discussed risks of blister formation, small  wound, skin dyspigmentation, or rare scar following cryotherapy. Recommend Vaseline ointment to treated areas while healing.      Pre-Operative Instructions  You are scheduled for a surgical procedure at [facility]. We recommend you read the following instructions. If you have any questions or concerns, please call the office at [phone].  Shower and wash the entire body with soap and water the day of your surgery paying special attention to cleansing at and around the planned surgery site.  Avoid aspirin or aspirin containing products at least fourteen (14) days prior to your surgical procedure and for at least one week (7 Days) after your surgical procedure. If you take aspirin on a regular basis for heart disease or history of stroke or for any other reason, we may recommend you continue taking aspirin but please notify us if you take this on a regular basis. Aspirin can cause more bleeding to occur during surgery as well as prolonged bleeding and bruising after surgery.   Avoid other nonsteroidal pain medications at least one week prior to surgery and at least one week prior to your surgery. These include medications such as Ibuprofen (Motrin, Advil and Nuprin), Naprosyn, Voltaren, Relafen, etc. If medications are used for therapeutic reasons, please inform us as they can cause increased bleeding or prolonged bleeding during and bruising after surgical procedures.   Please advise [place] if you are taking any "blood thinner" medications such  as Coumadin or Dipyridamole or Plavix or similar medications. These cause increased bleeding and prolonged bleeding during procedures and bruising after surgical procedures. We may have to consider discontinuing these medications briefly prior to and shortly after your surgery if safe to do so.   Please inform us of all medications you are currently taking. All medications that are taken regularly should be taken the day of surgery as you always do.  Nevertheless, we need to be informed of what medications you are taking prior to surgery to know whether they will affect the procedure or cause any complications.   Please inform us of any medication allergies. Also inform us of whether you have allergies to Latex or rubber products or whether you have had any adverse reaction to Lidocaine or Epinephrine.  Please inform us of any prosthetic or artificial body parts such as artificial heart valve, joint replacements, etc., or similar condition that might require preoperative antibiotics.   We recommend avoidance of alcohol at least two weeks prior to surgery and continued avoidance for at least two weeks after surgery.   We recommend discontinuation of tobacco smoking at least two weeks prior to surgery and continued abstinence for at least two weeks after surgery.  Do not plan strenuous exercise, strenuous work or strenuous lifting for approximately four weeks after your surgery.   We request if you are unable to make your scheduled surgical appointment, please call us at least a week in advance or as soon as you are aware of a problem so that we can cancel or reschedule the appointment.   You MAY TAKE TYLENOL (acetaminophen) for pain as it is not a blood thinner.   PLEASE PLAN TO BE IN TOWN FOR TWO WEEKS FOLLOWING SURGERY, THIS IS IMPORTANT SO YOU CAN BE CHECKED FOR DRESSING CHANGES, SUTURE REMOVAL AND TO MONITOR FOR POSSIBLE COMPLICATIONS.  If You Need Anything After Your Visit  If you have any questions or concerns for your doctor, please call our main line at [phone] and press option 4 to reach your doctor's medical assistant. If no one answers, please leave a voicemail as directed and we will return your call as soon as possible. Messages left after 4 pm will be answered the following business day.   You may also send [place] a message via [place]. We typically respond to MyChart messages within 1-2 business days.  For prescription  refills, please ask your pharmacy to contact our office. Our fax number is [phone].  If you have an urgent issue when the clinic is closed that cannot wait until the next business day, you can page your doctor at the number below.    Please note that while we do our best to be available for urgent issues outside of office hours, we are not available 24/7.   If you have an urgent issue and are unable to reach [place], you may choose to seek medical care at your doctor's office, retail clinic, urgent care center, or emergency room.  If you have a medical emergency, please immediately call 911 or go to the emergency department.  Pager Numbers  - Dr. Nehemiah Massed: [phone]  - Dr. Laurence Ferrari: [phone]  - Dr. Nicole Kindred: [phone]  In the event of inclement weather, please call our main line at [phone] for an update on the status of any delays or closures.  Dermatology Medication Tips: Please keep the boxes that topical medications come in in order to help keep track of the instructions about where and how  to use these. Pharmacies typically print the medication instructions only on the boxes and not directly on the medication tubes.   If your medication is too expensive, please contact our office at [phone] option 4 or send [place] a message through [place].   We are unable to tell what your co-pay for medications will be in advance as this is different depending on your insurance coverage. However, we may be able to find a substitute medication at lower cost or fill out paperwork to get insurance to cover a needed medication.   If a prior authorization is required to get your medication covered by your insurance company, please allow [place] 1-2 business days to complete this process.  Drug prices often vary depending on where the prescription is filled and some pharmacies may offer cheaper prices.  The website www.goodrx.com contains coupons for medications through different pharmacies. The  prices here do not account for what the cost may be with help from insurance (it may be cheaper with your insurance), but the website can give you the price if you did not use any insurance.  - You can print the associated coupon and take it with your prescription to the pharmacy.  - You may also stop by our office during regular business hours and pick up a GoodRx coupon card.  - If you need your prescription sent electronically to a different pharmacy, notify our office through [facility] or by phone at [phone] option 4.     Si Usted Necesita Algo Despus de Su Visita  Tambin puede enviarnos un mensaje a travs de Pharmacist, community. Por lo general respondemos a los mensajes de MyChart en el transcurso de 1 a 2 das hbiles.  Para renovar recetas, por favor pida a su farmacia que se ponga en contacto con nuestra oficina. Harland Dingwall de fax es [place] [phone].  Si tiene un asunto urgente cuando la clnica est cerrada y que no puede esperar hasta el siguiente da hbil, puede llamar/localizar a su doctor(a) al nmero que aparece a continuacin.   Por favor, tenga en cuenta que aunque hacemos todo lo posible para estar disponibles para asuntos urgentes fuera del horario de [place], no estamos disponibles las 24 horas del da, los 7 das de la [place].   Si tiene un problema urgente y no puede comunicarse con nosotros, puede optar por buscar atencin mdica  en el consultorio de su doctor(a), en una clnica privada, en un centro de atencin urgente o en una sala de emergencias.  Si tiene Engineering geologist, por favor llame inmediatamente al 911 o vaya a la sala de emergencias.  Nmeros de bper  - Dr. Nehemiah Massed: [phone]  - Dra. Moye: [phone]  - Dra. Nicole Kindred: [phone]  En caso de inclemencias del [place], por favor llame a Johnsie Kindred principal al [phone] para una actualizacin sobre el [place] de cualquier retraso o cierre.  Consejos para la medicacin en  dermatologa: Por favor, guarde las cajas en las que vienen los medicamentos de uso tpico para ayudarle a seguir las instrucciones sobre dnde y cmo usarlos. Las farmacias generalmente imprimen las instrucciones del medicamento slo en las cajas y no directamente en los tubos del [place].   Si su medicamento es muy caro, por favor, pngase en contacto con Zigmund Daniel llamando al [phone] y presione la opcin 4 o envenos un mensaje a travs de Pharmacist, community.   No podemos decirle cul ser su copago por los medicamentos por adelantado ya que esto es diferente dependiendo  de la cobertura de su seguro. Sin embargo, es posible que podamos encontrar un medicamento sustituto a Electrical engineer un formulario para que el seguro cubra el medicamento que se considera necesario.   Si se requiere una autorizacin previa para que su compaa de seguros [place] su medicamento, por favor permtanos de 1 a 2 das hbiles para completar este proceso.  Los precios de los medicamentos varan con frecuencia dependiendo del Environmental consultant de dnde se surte la receta y alguna farmacias pueden ofrecer precios ms baratos.  El sitio web www.goodrx.com tiene cupones para medicamentos de Airline pilot. Los precios aqu no tienen en cuenta lo que podra costar con la ayuda del seguro (puede ser ms barato con su seguro), pero el sitio web puede darle el precio si no utiliz Research scientist (physical sciences).  - Puede imprimir el cupn correspondiente y llevarlo con su receta a la farmacia.  - Tambin puede pasar por nuestra oficina durante el horario de atencin regular y Charity fundraiser una tarjeta de cupones de GoodRx.  - Si necesita que su receta se enve electrnicamente a una farmacia diferente, informe a nuestra oficina a travs de MyChart de [employer] o por telfono llamando al [phone] y presione la opcin 4.

## 2021-10-30 NOTE — Progress Notes (Signed)
New Patient Visit  Subjective  Pam Cruz is a 87 y.o. female who presents for the following: Annual Exam (Patient here for skin cancer screening. No hx of skin cancer or DN, per patient. C/O areas on back that itch. C/O lesion at forehead, raised. Dur: 10 years. Getting larger over time. ). The patient presents for Total-Body Skin Exam (TBSE) for skin cancer screening and mole check.  The patient has spots, moles and lesions to be evaluated, some may be new or changing and the patient has concerns that these could be cancer.  Referral from Dr. [name]  Review of Systems: No other skin or systemic complaints except as noted in HPI or Assessment and Plan.  Objective  Well appearing patient in no apparent distress; mood and affect are within normal limits.  A full examination was performed including scalp, head, eyes, ears, nose, lips, neck, chest, axillae, abdomen, back, buttocks, bilateral upper extremities, bilateral lower extremities, hands, feet, fingers, toes, fingernails, and toenails. All findings within normal limits unless otherwise noted below.  glabella 0.7cm subcutaneous papule     back x3 (3) Erythematous keratotic or waxy stuck-on papule or plaque.   Assessment & Plan   Lentigines - Scattered tan macules - Due to sun exposure - Benign-appearing, observe - Recommend daily broad spectrum sunscreen SPF 30+ to sun-exposed areas, reapply every 2 hours as needed. - Call for any changes  Seborrheic Keratoses - Stuck-on, waxy, tan-brown papules and/or plaques  - Benign-appearing - Discussed benign etiology and prognosis. - Observe - Call for any changes  Melanocytic Nevi - Tan-brown and/or pink-flesh-colored symmetric macules and papules - Benign appearing on exam today - Observation - Call clinic for new or changing moles - Recommend daily use of broad spectrum spf 30+ sunscreen to sun-exposed areas.   Hemangiomas - Red papules - Discussed benign  nature - Observe - Call for any changes  Actinic Damage - Chronic condition, secondary to cumulative UV/sun exposure - diffuse scaly erythematous macules with underlying dyspigmentation - Recommend daily broad spectrum sunscreen SPF 30+ to sun-exposed areas, reapply every 2 hours as needed.  - Staying in the shade or wearing long sleeves, sun glasses (UVA+UVB protection) and wide brim hats (4-inch brim around the entire circumference of the hat) are also recommended for sun protection.  - Call for new or changing lesions.  Skin cancer screening performed today.  Purpura - Chronic; persistent and recurrent.  Treatable, but not curable. - Violaceous macules and patches at B/L forearms  - Benign - Related to trauma, age, sun damage and/or use of blood thinners, chronic use of topical and/or oral steroids - Observe - Can use OTC arnica containing moisturizer such as Dermend Bruise Formula if desired - Call for worsening or other concerns  Neoplasm of skin glabella Cyst vs Nevus Schedule  surgical appointment for removal.  Discussed excision, including resulting scar.    Inflamed seborrheic keratosis (3) back x3 Destruction of lesion - back x3 Complexity: simple   Destruction method: cryotherapy   Informed consent: discussed and consent obtained   Timeout:  patient name, date of birth, surgical site, and procedure verified Lesion destroyed using liquid nitrogen: Yes   Region frozen until ice ball extended beyond lesion: Yes   Outcome: patient tolerated procedure well with no complications   Post-procedure details: wound care instructions given    Return in about 1 year (around 10/30/2022) for TBSE, Surgery next available.  I, Emelia Salisbury, CMA, am acting as scribe for [employer]  Nehemiah Massed, MD. Documentation: I have reviewed the above documentation for accuracy and completeness, and I agree with the above.  Sarina Ser, MD

## 2021-10-31 ENCOUNTER — Encounter: Payer: Self-pay | Admitting: Dermatology

## 2021-11-07 ENCOUNTER — Other Ambulatory Visit: Payer: Self-pay | Admitting: Gastroenterology

## 2021-11-07 DIAGNOSIS — G8929 Other chronic pain: Secondary | ICD-10-CM | POA: Diagnosis not present

## 2021-11-07 DIAGNOSIS — R1031 Right lower quadrant pain: Secondary | ICD-10-CM | POA: Diagnosis not present

## 2021-11-07 DIAGNOSIS — K219 Gastro-esophageal reflux disease without esophagitis: Secondary | ICD-10-CM | POA: Diagnosis not present

## 2021-11-07 DIAGNOSIS — R634 Abnormal weight loss: Secondary | ICD-10-CM | POA: Diagnosis not present

## 2021-11-07 DIAGNOSIS — K588 Other irritable bowel syndrome: Secondary | ICD-10-CM | POA: Diagnosis not present

## 2021-11-12 DIAGNOSIS — J449 Chronic obstructive pulmonary disease, unspecified: Secondary | ICD-10-CM | POA: Diagnosis not present

## 2021-11-12 DIAGNOSIS — K219 Gastro-esophageal reflux disease without esophagitis: Secondary | ICD-10-CM | POA: Diagnosis not present

## 2021-11-12 DIAGNOSIS — I10 Essential (primary) hypertension: Secondary | ICD-10-CM | POA: Diagnosis not present

## 2021-11-13 ENCOUNTER — Telehealth: Payer: Self-pay

## 2021-11-13 NOTE — Telephone Encounter (Signed)
Pt called with questions from pre op form that was mailed out.  Answered pt's questions./sh

## 2021-11-14 ENCOUNTER — Telehealth: Payer: Self-pay | Admitting: Student-PharmD

## 2021-11-14 NOTE — Chronic Care Management (AMB) (Signed)
Chronic Care Management   Pharmacy RPM Note  11/14/2021 Name: Pam Cruz MRN: [id number] DOB: [birth year]  Referred by: Pam Guise, MD Reason for referral : RPM Monthly Review      [place]  Name: Pam Cruz  Date of Birth: May 26, 1935  Gender: Female  Primary provider: [name]  RPM Coordinator: Charlann Lange  ICD Code: I10 Device ID: [id number] [place]: iBP Measurement Type: blood pressure Days of Monitoring: 16 Documentation Time: 1 Hr 2 Min 0 Sec BLOOD PRESSURE  Measurement Date: 11/13/2021 Monday at 24:09 PM Systolic / Diastolic (mmHg): 735 / 77 Measurement Date: 11/13/2021 Monday at 32:99 PM Systolic / Diastolic (mmHg): 242 / 73 Measurement Date: 11/11/2021 Saturday at 68:34 PM Systolic / Diastolic (mmHg): 196 / 69 Measurement Date: 11/11/2021 Saturday at 22:29 PM Systolic / Diastolic (mmHg): 798 / 75 Measurement Date: 11/08/2021 Wednesday at 92:11 PM Systolic / Diastolic (mmHg): 941 / 77 Measurement Date: 11/08/2021 Wednesday at 74:08 PM Systolic / Diastolic (mmHg): 144 / 79 Measurement Date: 11/05/2021 Sunday at 81:85 PM Systolic / Diastolic (mmHg): 631 / 89 Measurement Date: 11/05/2021 Sunday at 49:70 PM Systolic / Diastolic (mmHg): 263 / 91 Measurement Date: 11/05/2021 Sunday at 78:58 PM Systolic / Diastolic (mmHg): 850 / 71 Measurement Date: 11/03/2021 Friday at 27:74 PM Systolic / Diastolic (mmHg): 128 / 71 Measurement Date: 11/03/2021 Friday at 78:67 PM Systolic / Diastolic (mmHg): 672 / 66 Measurement Date: 11/01/2021 Wednesday at 09:47 PM Systolic / Diastolic (mmHg): 096 / 66 Measurement Date: 11/01/2021 Wednesday at 28:36 PM Systolic / Diastolic (mmHg): 629 / 85 Measurement Date: 10/31/2021 Tuesday at 47:65 PM Systolic / Diastolic (mmHg): 465 / 69 Measurement Date: 10/31/2021 Tuesday at 03:54 PM Systolic / Diastolic (mmHg): 656 / 66 Measurement Date: 10/29/2021 Sunday at 81:27 PM Systolic / Diastolic (mmHg): 517 / 65 Measurement  Date: 10/29/2021 Sunday at 00:17 PM Systolic / Diastolic (mmHg): 494 / 68 Measurement Date: 10/28/2021 Saturday at 49:67 PM Systolic / Diastolic (mmHg): 591 / 73 Measurement Date: 10/28/2021 Saturday at 63:84 PM Systolic / Diastolic (mmHg): 665 / 67 Measurement Date: 10/27/2021 Friday at 99:35 PM Systolic / Diastolic (mmHg): 701 / 82 Measurement Date: 10/27/2021 Friday at 77:93 PM Systolic / Diastolic (mmHg): 903 / 78 Measurement Date: 10/26/2021 Thursday at 00:92 PM Systolic / Diastolic (mmHg): 330 / 71 Measurement Date: 10/26/2021 Thursday at 07:62 PM Systolic / Diastolic (mmHg): 263 / 88 Measurement Date: 10/25/2021 Wednesday at 33:54 PM Systolic / Diastolic (mmHg): 562 / 74 Measurement Date: 10/25/2021 Wednesday at 56:38 PM Systolic / Diastolic (mmHg): 937 / 85 Measurement Date: 10/24/2021 Tuesday at 34:28 PM Systolic / Diastolic (mmHg): 768 / 80 Measurement Date: 10/22/2021 Sunday at 11:57 PM Systolic / Diastolic (mmHg): 262 / 68 Measurement Date: 10/22/2021 Sunday at 03:55 PM Systolic / Diastolic (mmHg): 974 / 77 Measurement Date: 10/21/2021 Saturday at 16:38 PM Systolic / Diastolic (mmHg): 453 / 83 Measurement Date: 10/21/2021 Saturday at 64:68 PM Systolic / Diastolic (mmHg): 032 / 77 Measurement Date: 10/18/2021 Wednesday at 12:24 PM Systolic / Diastolic (mmHg): 825 / 77 Measurement Date: 10/18/2021 Wednesday at 00:37 PM Systolic / Diastolic (mmHg): 048 / 75  PULSE  Measurement Date: 11/13/2021 Monday at 11:26 PM Pulse (IN BPM): 60 Measurement Date: 11/13/2021 Monday at 03:26 PM Pulse (IN BPM): 69 Measurement Date: 11/11/2021 Saturday at 10:46 PM Pulse (IN BPM): 61 Measurement Date: 11/11/2021 Saturday at 05:21 PM Pulse (IN BPM): 62 Measurement Date: 11/08/2021 Wednesday at 11:49 PM Pulse (IN BPM): 56 Measurement Date: 11/08/2021 Wednesday  at 04:24 PM Pulse (IN BPM): 61 Measurement Date: 11/05/2021 Sunday at 11:19 PM Pulse (IN BPM): 59 Measurement Date: 11/05/2021 Sunday at 11:17 PM  Pulse (IN BPM): 57 Measurement Date: 11/05/2021 Sunday at 04:39 PM Pulse (IN BPM): 61 Measurement Date: 11/03/2021 Friday at 11:30 PM Pulse (IN BPM): 62 Measurement Date: 11/03/2021 Friday at 04:31 PM Pulse (IN BPM): 70 Measurement Date: 11/01/2021 Wednesday at 11:50 PM Pulse (IN BPM): 62 Measurement Date: 11/01/2021 Wednesday at 05:47 PM Pulse (IN BPM): 58 Measurement Date: 10/31/2021 Tuesday at 11:48 PM Pulse (IN BPM): 60 Measurement Date: 10/31/2021 Tuesday at 05:21 PM Pulse (IN BPM): 60 Measurement Date: 10/29/2021 Sunday at 11:36 PM Pulse (IN BPM): 56 Measurement Date: 10/29/2021 Sunday at 05:00 PM Pulse (IN BPM): 60 Measurement Date: 10/28/2021 Saturday at 10:36 PM Pulse (IN BPM): 57 Measurement Date: 10/28/2021 Saturday at 03:51 PM Pulse (IN BPM): 67 Measurement Date: 10/27/2021 Friday at 11:33 PM Pulse (IN BPM): 63 Measurement Date: 10/27/2021 Friday at 02:46 PM Pulse (IN BPM): 63 Measurement Date: 10/26/2021 Thursday at 11:49 PM Pulse (IN BPM): 68 Measurement Date: 10/26/2021 Thursday at 06:13 PM Pulse (IN BPM): 64 Measurement Date: 10/25/2021 Wednesday at 11:18 PM Pulse (IN BPM): 57 Measurement Date: 10/25/2021 Wednesday at 04:50 PM Pulse (IN BPM): 70 Measurement Date: 10/24/2021 Tuesday at 03:26 PM Pulse (IN BPM): 60 Measurement Date: 10/22/2021 Sunday at 11:34 PM Pulse (IN BPM): 61 Measurement Date: 10/22/2021 Sunday at 04:19 PM Pulse (IN BPM): 70 Measurement Date: 10/21/2021 Saturday at 11:25 PM Pulse (IN BPM): 58 Measurement Date: 10/21/2021 Saturday at 04:04 PM Pulse (IN BPM): 70 Measurement Date: 10/18/2021 Wednesday at 11:44 PM Pulse (IN BPM): 60 Measurement Date: 10/18/2021 Wednesday at 04:20 PM Pulse (IN BPM): 67        Notes for January-2023 : 1 Hr 2 Min 0 Sec  Date and Time: 11/14/2021 Tuesday at 10:13 AM User: Alena Bills Time logged: 06:00 Notes: DATA REVIEW SYSTOLIC BP  Automatically transmitted data is reviewed today from device. (ID:  [id number]).  Systolic BP reading is >102 for 0 % of time  Systolic BP reading is between 160 to 179 for 21.88 % of time  Systolic BP reading is between 140 to 159 for 9.38 % of time  Systolic BP reading is between 120-139 for 56.25 % of time  Systolic BP reading is between 101-120 for 18.75 % of time  Systolic BP reading is <725 for 0 % of time  DIASTOLIC BP  Diastolic BP reading is elevated >100 for 0 % of time  Diastolic BP reading is between 90-99 for 3.13 % of time  PULSE  Pulse is >120 for 0 % of time  Pulse is <50 for 0 % of time  COMPLIANCE NOTES BLOOD PRESSURE  Patient is compliant - 16 days of readings obtained.  MANAGEMENT NOTES We will continue to monitor BP readings for now  Medications for BP reviewed for this patient   Date and Time: 11/14/2021 Tuesday at 08:50 AM User: Charlann Lange Time logged: 04:00 Notes: REVIEW NOTES All the daily Blood pressure data noted since last review  Heart rate/Pulse data reviewed   Date and Time: 11/13/2021 Monday at 11:01 AM User: Charlann Lange Time logged: 04:00 Notes: REVIEW NOTES All the daily Blood pressure data noted since last review  Heart rate/Pulse data reviewed   Date and Time: 11/09/2021 Thursday at 03:00 PM User: Charlann Lange Time logged: 04:00 Notes: REVIEW NOTES All the daily Blood pressure data noted since last review  Heart rate/Pulse data  reviewed   Date and Time: 11/06/2021 Monday at 12:56 PM User: Charlann Lange Time logged: 10:00 Notes: REVIEW NOTES All the daily Blood pressure data noted since last review  Heart rate/Pulse data reviewed   Date and Time: 11/01/2021 Wednesday at 03:44 PM User: Charlann Lange Time logged: 04:00 Notes: REVIEW NOTES All the daily Blood pressure data noted since last review  Heart rate/Pulse data reviewed   Date and Time: 10/30/2021 Monday at 03:48 PM User: Charlann Lange Time logged: 12:00 Notes: REVIEW  NOTES All the daily Blood pressure data noted since last review  Heart rate/Pulse data reviewed   Date and Time: 10/26/2021 Thursday at 03:32 PM User: Charlann Lange Time logged: 06:00 Notes: REVIEW NOTES All the daily Blood pressure data noted since last review  Heart rate/Pulse data reviewed   Date and Time: 10/23/2021 Monday at 02:56 PM User: Charlann Lange Time logged: 12:00 Notes: REVIEW NOTES All the daily Blood pressure data noted since last review  Heart rate/Pulse data reviewed  Verbal Consent Obtained on April 12, 2021. Patient was notified about Remote Patient Monitoring (RPM) program offered by our medical practice to improve the patient care, co-ordination of care, patient education, provide quality of care, reduce the cost and provide personalized care. Patient was informed that this is Medicare approved program and can be furnished by only one provider, can be stopped by the patient anytime. Patient has verbally consented to enroll in this program and our designated care coordinator to contact by phone as needed at least for 20 minutes a month. Practice also provides 24/7 care to the patient needs. All patient questions were answered and patient was enrolled in the RPM program.   Reviewed by: Alena Bills, PharmD Clinical Pharmacist [phone]

## 2021-11-15 ENCOUNTER — Telehealth: Payer: Medicare Other

## 2021-11-16 DIAGNOSIS — H353210 Exudative age-related macular degeneration, right eye, stage unspecified: Secondary | ICD-10-CM | POA: Diagnosis not present

## 2021-11-16 DIAGNOSIS — H35372 Puckering of macula, left eye: Secondary | ICD-10-CM | POA: Diagnosis not present

## 2021-11-23 ENCOUNTER — Ambulatory Visit
Admission: RE | Admit: 2021-11-23 | Discharge: 2021-11-23 | Disposition: A | Discharge status: Home / Self Care | Payer: Medicare Other | Source: Ambulatory Visit | Attending: Gastroenterology | Admitting: Gastroenterology

## 2021-11-23 ENCOUNTER — Other Ambulatory Visit: Payer: Self-pay

## 2021-11-23 DIAGNOSIS — G8929 Other chronic pain: Secondary | ICD-10-CM | POA: Diagnosis not present

## 2021-11-23 DIAGNOSIS — R634 Abnormal weight loss: Secondary | ICD-10-CM | POA: Diagnosis not present

## 2021-11-23 DIAGNOSIS — R1031 Right lower quadrant pain: Secondary | ICD-10-CM | POA: Insufficient documentation

## 2021-11-23 LAB — POCT I-STAT CREATININE: Creatinine, Ser: 0.9 mg/dL (ref 0.44–1.00)

## 2021-11-23 MED ORDER — IOHEXOL 300 MG/ML  SOLN
70.0000 mL | Freq: Once | INTRAMUSCULAR | Status: AC | PRN
  Administered 2021-11-23: 70 mL via INTRAVENOUS

## 2021-12-04 ENCOUNTER — Other Ambulatory Visit: Payer: Self-pay

## 2021-12-12 ENCOUNTER — Other Ambulatory Visit: Payer: Self-pay

## 2021-12-12 ENCOUNTER — Ambulatory Visit (INDEPENDENT_AMBULATORY_CARE_PROVIDER_SITE_OTHER): Payer: Medicare Other | Admitting: Dermatology

## 2021-12-12 ENCOUNTER — Telehealth: Payer: Self-pay | Admitting: Student-PharmD

## 2021-12-12 DIAGNOSIS — J449 Chronic obstructive pulmonary disease, unspecified: Secondary | ICD-10-CM

## 2021-12-12 DIAGNOSIS — D492 Neoplasm of unspecified behavior of bone, soft tissue, and skin: Secondary | ICD-10-CM

## 2021-12-12 DIAGNOSIS — L720 Epidermal cyst: Secondary | ICD-10-CM

## 2021-12-12 DIAGNOSIS — L723 Sebaceous cyst: Secondary | ICD-10-CM | POA: Diagnosis not present

## 2021-12-12 DIAGNOSIS — I10 Essential (primary) hypertension: Secondary | ICD-10-CM

## 2021-12-12 MED ORDER — MUPIROCIN 2 % EX OINT: 1.0000 "application " | TOPICAL_OINTMENT | Freq: Every day | CUTANEOUS | 1 refills | Status: DC

## 2021-12-12 NOTE — Chronic Care Management (AMB) (Signed)
Chronic Care Management   Pharmacy RPM Note  12/12/2021 Name: Pam Cruz MRN: [id number] DOB: [birth year]  Referred by: Lavera Guise, MD Reason for referral : RPM Monthly Review        [name]  Name: Pam Cruz  Date of Birth: 1935/02/05  Gender: Female  Primary provider: [name]  RPM Coordinator: Charlann Lange  ICD Code: I10 Device ID: [id number] [place]: iBP Measurement Type: blood pressure Days of Monitoring: 17 Documentation Time: 1 Hr 1 Min 0 Sec BLOOD PRESSURE  Measurement Date: 12/11/2021 Monday at 16:96 PM Systolic / Diastolic (mmHg): 789 / 84 Measurement Date: 12/11/2021 Monday at 38:10 PM Systolic / Diastolic (mmHg): 175 / 77 Measurement Date: 12/10/2021 Sunday at 10:25 PM Systolic / Diastolic (mmHg): 852 / 87 Measurement Date: 12/10/2021 Sunday at 77:82 PM Systolic / Diastolic (mmHg): 423 / 98 Measurement Date: 12/09/2021 Saturday at 53:61 PM Systolic / Diastolic (mmHg): 443 / 85 Measurement Date: 12/09/2021 Saturday at 15:40 PM Systolic / Diastolic (mmHg): 086 / 76 Measurement Date: 12/08/2021 Friday at 76:19 PM Systolic / Diastolic (mmHg): 509 / 73 Measurement Date: 12/08/2021 Friday at 32:67 PM Systolic / Diastolic (mmHg): 124 / 86 Measurement Date: 12/05/2021 Tuesday at 58:09 PM Systolic / Diastolic (mmHg): 983 / 99 Measurement Date: 12/04/2021 Monday at 38:25 PM Systolic / Diastolic (mmHg): 053 / 86 Measurement Date: 12/04/2021 Monday at 97:67 PM Systolic / Diastolic (mmHg): 341 / 91 Measurement Date: 12/03/2021 Sunday at 93:79 PM Systolic / Diastolic (mmHg): 024 / 88 Measurement Date: 12/03/2021 Sunday at 09:73 PM Systolic / Diastolic (mmHg): 532 / 89 Measurement Date: 12/01/2021 Friday at 99:24 PM Systolic / Diastolic (mmHg): 268 / 85 Measurement Date: 12/01/2021 Friday at 34:19 PM Systolic / Diastolic (mmHg): 622 / 90 Measurement Date: 11/30/2021 Thursday at 29:79 PM Systolic / Diastolic (mmHg): 892 / 82 Measurement Date:  11/30/2021 Thursday at 11:94 PM Systolic / Diastolic (mmHg): 174 / 82 Measurement Date: 11/28/2021 Tuesday at 08:14 PM Systolic / Diastolic (mmHg): 481 / 69 Measurement Date: 11/28/2021 Tuesday at 85:63 PM Systolic / Diastolic (mmHg): 149 / 76 Measurement Date: 11/27/2021 Monday at 70:26 PM Systolic / Diastolic (mmHg): 378 / 76 Measurement Date: 11/27/2021 Monday at 58:85 PM Systolic / Diastolic (mmHg): 027 / 77 Measurement Date: 11/26/2021 Sunday at 74:12 PM Systolic / Diastolic (mmHg): 878 / 74 Measurement Date: 11/26/2021 Sunday at 67:67 PM Systolic / Diastolic (mmHg): 209 / 81 Measurement Date: 11/24/2021 Friday at 47:09 PM Systolic / Diastolic (mmHg): 628 / 79 Measurement Date: 11/24/2021 Friday at 36:62 PM Systolic / Diastolic (mmHg): 947 / 71 Measurement Date: 11/22/2021 Wednesday at 65:46 PM Systolic / Diastolic (mmHg): 503 / 93 Measurement Date: 11/22/2021 Wednesday at 54:65 PM Systolic / Diastolic (mmHg): 681 / 74 Measurement Date: 11/20/2021 Monday at 27:51 PM Systolic / Diastolic (mmHg): 700 / 76 Measurement Date: 11/20/2021 Monday at 17:49 PM Systolic / Diastolic (mmHg): 449 / 91 Measurement Date: 11/19/2021 Sunday at 67:59 PM Systolic / Diastolic (mmHg): 163 / 79 Measurement Date: 11/19/2021 Sunday at 84:66 PM Systolic / Diastolic (mmHg): 599 / 96 Measurement Date: 11/17/2021 Friday at 35:70 PM Systolic / Diastolic (mmHg): 177 / 80 Measurement Date: 11/17/2021 Friday at 93:90 PM Systolic / Diastolic (mmHg): 300 / 82  PULSE  Measurement Date: 12/11/2021 Monday at 11:27 PM Pulse (IN BPM): 55 Measurement Date: 12/11/2021 Monday at 06:16 PM Pulse (IN BPM): 64 Measurement Date: 12/10/2021 Sunday at 11:27 PM Pulse (IN BPM): 61 Measurement Date: 12/10/2021 Sunday at 05:59 PM Pulse (IN BPM):  58 Measurement Date: 12/09/2021 Saturday at 09:23 PM Pulse (IN BPM): 60 Measurement Date: 12/09/2021 Saturday at 04:07 PM Pulse (IN BPM): 66 Measurement Date: 12/08/2021 Friday at 11:14 PM Pulse  (IN BPM): 58 Measurement Date: 12/08/2021 Friday at 04:30 PM Pulse (IN BPM): 64 Measurement Date: 12/05/2021 Tuesday at 05:41 PM Pulse (IN BPM): 59 Measurement Date: 12/04/2021 Monday at 10:13 PM Pulse (IN BPM): 61 Measurement Date: 12/04/2021 Monday at 03:42 PM Pulse (IN BPM): 59 Measurement Date: 12/03/2021 Sunday at 11:20 PM Pulse (IN BPM): 63 Measurement Date: 12/03/2021 Sunday at 04:23 PM Pulse (IN BPM): 55 Measurement Date: 12/01/2021 Friday at 11:48 PM Pulse (IN BPM): 62 Measurement Date: 12/01/2021 Friday at 05:30 PM Pulse (IN BPM): 58 Measurement Date: 11/30/2021 Thursday at 11:55 PM Pulse (IN BPM): 59 Measurement Date: 11/30/2021 Thursday at 04:37 PM Pulse (IN BPM): 67 Measurement Date: 11/28/2021 Tuesday at 11:45 PM Pulse (IN BPM): 57 Measurement Date: 11/28/2021 Tuesday at 06:18 PM Pulse (IN BPM): 60 Measurement Date: 11/27/2021 Monday at 11:12 PM Pulse (IN BPM): 61 Measurement Date: 11/27/2021 Monday at 04:33 PM Pulse (IN BPM): 70 Measurement Date: 11/26/2021 Sunday at 11:13 PM Pulse (IN BPM): 61 Measurement Date: 11/26/2021 Sunday at 05:37 PM Pulse (IN BPM): 60 Measurement Date: 11/24/2021 Friday at 10:56 PM Pulse (IN BPM): 59 Measurement Date: 11/24/2021 Friday at 04:29 PM Pulse (IN BPM): 68 Measurement Date: 11/22/2021 Wednesday at 11:21 PM Pulse (IN BPM): 57 Measurement Date: 11/22/2021 Wednesday at 03:13 PM Pulse (IN BPM): 68 Measurement Date: 11/20/2021 Monday at 10:52 PM Pulse (IN BPM): 59 Measurement Date: 11/20/2021 Monday at 04:47 PM Pulse (IN BPM): 62 Measurement Date: 11/19/2021 Sunday at 11:20 PM Pulse (IN BPM): 54 Measurement Date: 11/19/2021 Sunday at 04:38 PM Pulse (IN BPM): 54 Measurement Date: 11/17/2021 Friday at 11:49 PM Pulse (IN BPM): 61 Measurement Date: 11/17/2021 Friday at 03:57 PM Pulse (IN BPM): 58  IRREGULAR HEART BEATS  Measurement Date: 12/10/2021 Sunday at 05:59 PM Irregular Heart Beats: Yes Measurement Date: 11/20/2021 Monday at 04:47 PM  Irregular Heart Beats: Yes Measurement Date: 11/17/2021 Friday at 03:57 PM Irregular Heart Beats: Yes        Notes for February-2023 : 1 Hr 1 Min 0 Sec  Date and Time: 12/12/2021 Tuesday at 10:17 AM User: Alena Bills Time logged: 07:00 Notes: DATA REVIEW SYSTOLIC BP  Automatically transmitted data is reviewed today from device. (ID: [id number]).  Systolic BP reading is >254 for 3.03 % of time  Systolic BP reading is between 160 to 179 for 18.18 % of time  Systolic BP reading is between 140 to 159 for 36.36 % of time  Systolic BP reading is between 120-139 for 36.36 % of time  Systolic BP reading is between 101-120 for 6.06 % of time  Systolic BP reading is <270 for 0 % of time  DIASTOLIC BP  Diastolic BP reading is elevated >100 for 0 % of time  Diastolic BP reading is between 90-99 for 21.21 % of time  PULSE  Pulse is >120 for 0 % of time  Pulse is <50 for 0 % of time  COMPLIANCE NOTES BLOOD PRESSURE  Patient is compliant - 16 days of readings obtained.  MANAGEMENT NOTES We will continue to monitor BP readings for now  Medications for BP reviewed for this patient  No change on the treatment plan   Date and Time: 12/11/2021 Monday at 01:49 PM User: Charlann Lange Time logged: 08:00 Notes: COMPLIANCE NOTES BLOOD PRESSURE  Patient is compliant - 16 days of  readings obtained.  REVIEW NOTES All the daily Blood pressure data noted since last review  Heart rate/Pulse data reviewed   Date and Time: 12/08/2021 Friday at 02:22 PM User: Charlann Lange Time logged: 18:00 Notes: REVIEW NOTES All the daily Blood pressure data noted since last review  Heart rate/Pulse data reviewed   Date and Time: 11/29/2021 Wednesday at 04:27 PM User: Charlann Lange Time logged: 04:00 Notes: REVIEW NOTES All the daily Blood pressure data noted since last review  Heart rate/Pulse data reviewed   Date and Time: 11/27/2021 Monday at 12:36  PM User: Charlann Lange Time logged: 08:00 Notes: REVIEW NOTES All the daily Blood pressure data noted since last review  Heart rate/Pulse data reviewed   Date and Time: 11/23/2021 Thursday at 02:28 PM User: Charlann Lange Time logged: 04:00 Notes: REVIEW NOTES All the daily Blood pressure data noted since last review  Heart rate/Pulse data reviewed   Date and Time: 11/21/2021 Tuesday at 04:27 PM User: Charlann Lange Time logged: 04:00 Notes: REVIEW NOTES All the daily Blood pressure data noted since last review  Heart rate/Pulse data reviewed   Date and Time: 11/20/2021 Monday at 03:46 PM User: Charlann Lange Time logged: 08:00 Notes: REVIEW NOTES All the daily Blood pressure data noted since last review  Heart rate/Pulse data reviewed  Verbal Consent Obtained on April 12, 2021. Patient was notified about Remote Patient Monitoring (RPM) program offered by our medical practice to improve the patient care, co-ordination of care, patient education, provide quality of care, reduce the cost and provide personalized care. Patient was informed that this is Medicare approved program and can be furnished by only one provider, can be stopped by the patient anytime. Patient has verbally consented to enroll in this program and our designated care coordinator to contact by phone as needed at least for 20 minutes a month. Practice also provides 24/7 care to the patient needs. All patient questions were answered and patient was enrolled in the RPM program.  7 minutes spent in review, coordination, and documentation.  Reviewed by: Alena Bills, PharmD Clinical Pharmacist [phone]

## 2021-12-12 NOTE — Progress Notes (Signed)
° °  Follow-Up Visit   Subjective  Pam Cruz is a 87 y.o. female who presents for the following: Procedure (Cyst vs Nevus of glabella - Excise today).  The following portions of the chart were reviewed this encounter and updated as appropriate:   Tobacco   Allergies   Meds   Problems   Med Hx   Surg Hx   Fam Hx      Review of Systems:  No other skin or systemic complaints except as noted in HPI or Assessment and Plan.  Objective  Well appearing patient in no apparent distress; mood and affect are within normal limits.  A focused examination was performed including face. Relevant physical exam findings are noted in the Assessment and Plan.  Glabella Firm flesh colored pap   Assessment & Plan  Neoplasm of skin Glabella  Skin excision  Lesion length (cm):  1.1 Lesion width (cm):  1.1 Margin per side (cm):  0 Total excision diameter (cm):  1.1 Informed consent: discussed and consent obtained   Timeout: patient name, date of birth, surgical site, and procedure verified   Procedure prep:  Patient was prepped and draped in usual sterile fashion Prep type:  Isopropyl alcohol and povidone-iodine Anesthesia: the lesion was anesthetized in a standard fashion   Anesthetic:  1% lidocaine w/ epinephrine 1-100,000 buffered w/ 8.4% NaHCO3 (2cc lido w/ epi) Instrument used comment:  %15c blade Hemostasis achieved with: pressure   Hemostasis achieved with comment:  Electrocautery Outcome: patient tolerated procedure well with no complications   Post-procedure details: sterile dressing applied and wound care instructions given   Dressing type: bandage and pressure dressing (Mupirocin)    Skin repair Complexity:  Simple Final length (cm):  1.1 Informed consent: discussed and consent obtained   Timeout: patient name, date of birth, surgical site, and procedure verified   Procedure prep:  Patient was prepped and draped in usual sterile fashion Prep type:  Povidone-iodine Anesthesia:  the lesion was anesthetized in a standard fashion   Anesthetic:  1% lidocaine w/ epinephrine 1-100,000 buffered w/ 8.4% NaHCO3 Undermining: edges could be approximated without difficulty   Fine/surface layer approximation (top stitches):  Suture size:  5-0 Suture type: nylon   Suture type comment:  Nylon Stitches: horizontal mattress   Suture removal (days):  7 Hemostasis achieved with: pressure and electrodesiccation Outcome: patient tolerated procedure well with no complications   Post-procedure details: sterile dressing applied and wound care instructions given   Dressing type: bandage, pressure dressing and bacitracin (Mupirocin)   Additional details:  1 horizontal mattress suture  mupirocin ointment (BACTROBAN) 2 % Apply 1 application topically daily. Qd to excision site  Specimen 1 - Surgical pathology Differential Diagnosis: D48.5 Cyst vs Nevus  Check Margins: No Firm flesh colored pap 1.1cm  Cyst vs Nevus Excised today   Return in about 1 week (around 12/19/2021) for suture removal.  I, Othelia Pulling, RMA, am acting as scribe for Sarina Ser, MD . Documentation: I have reviewed the above documentation for accuracy and completeness, and I agree with the above.  Sarina Ser, MD

## 2021-12-12 NOTE — Patient Instructions (Signed)
Wound Care Instructions  On the day following your surgery, you should begin doing daily dressing changes: Remove the old dressing and discard it. Cleanse the wound gently with tap water. This may be done in the shower or by placing a wet gauze pad directly on the wound and letting it soak for several minutes. It is important to gently remove any dried blood from the wound in order to encourage healing. This may be done by gently rolling a moistened Q-tip on the dried blood. Do not pick at the wound. If the wound should start to bleed, continue cleaning the wound, then place a moist gauze pad on the wound and hold pressure for a few minutes.  Make sure you then dry the skin surrounding the wound completely or the tape will not stick to the skin. Do not use cotton balls on the wound. After the wound is clean and dry, apply the ointment gently with a Q-tip. Cut a non-stick pad to fit the size of the wound. Lay the pad flush to the wound. If the wound is draining, you may want to reinforce it with a small amount of gauze on top of the non-stick pad for a little added compression to the area. Use the tape to seal the area completely. Select from the following with respect to your individual situation: If your wound has been stitched closed: continue the above steps 1-8 at least daily until your sutures are removed. If your wound has been left open to heal: continue steps 1-8 at least daily for the first 3-4 weeks. We would like for you to take a few extra precautions for at least the next week. Sleep with your head elevated on pillows if our wound is on your head. Do not bend over or lift heavy items to reduce the chance of elevated blood pressure to the wound Do not participate in particularly strenuous activities.   Below is a list of dressing supplies you might need.  Cotton-tipped applicators - Q-tips Gauze pads (2x2 and/or 4x4) - All-Purpose Sponges Non-stick dressing material - Telfa Tape -  Paper or Hypafix New and clean tube of petroleum jelly - Vaseline    Comments on Post-Operative Period Slight swelling and redness often appear around the wound. This is normal and will disappear within several days following the surgery. The healing wound will drain a brownish-red-yellow discharge during healing. This is a normal phase of wound healing. As the wound begins to heal, the drainage may increase in amount. Again, this drainage is normal. Notify us if the drainage becomes persistently bloody, excessively swollen, or intensely painful or develops a foul odor or red streaks.  If you should experience mild discomfort during the healing phase, you may take an aspirin-free medication such as Tylenol (acetaminophen). Notify us if the discomfort is severe or persistent. Avoid alcoholic beverages when taking pain medicine.  In Case of Wound Hemorrhage A wound hemorrhage is when the bandage suddenly becomes soaked with bright red blood and flows profusely. If this happens, sit down or lie down with your head elevated. If the wound has a dressing on it, do not remove the dressing. Apply pressure to the existing gauze. If the wound is not covered, use a gauze pad to apply pressure and continue applying the pressure for 20 minutes without peeking. DO NOT COVER THE WOUND WITH A LARGE TOWEL OR [place] CLOTH. Release your hand from the wound site but do not remove the dressing. If the bleeding has stopped,  gently clean around the wound. Leave the dressing in place for 24 hours if possible. This wait time allows the blood vessels to close off so that you do not spark a new round of bleeding by disrupting the newly clotted blood vessels with an immediate dressing change. If the bleeding does not subside, continue to hold pressure. If matters are out of your control, contact an After Hours clinic or go to the Emergency Room.   If You Need Anything After Your Visit  If you have any questions or concerns for  your doctor, please call our main line at [phone] and press option 4 to reach your doctor's medical assistant. If no one answers, please leave a voicemail as directed and we will return your call as soon as possible. Messages left after 4 pm will be answered the following business day.   You may also send [place] a message via [place]. We typically respond to MyChart messages within 1-2 business days.  For prescription refills, please ask your pharmacy to contact our office. Our fax number is [phone].  If you have an urgent issue when the clinic is closed that cannot wait until the next business day, you can page your doctor at the number below.    Please note that while we do our best to be available for urgent issues outside of office hours, we are not available 24/7.   If you have an urgent issue and are unable to reach [place], you may choose to seek medical care at your doctor's office, retail clinic, urgent care center, or emergency room.  If you have a medical emergency, please immediately call 911 or go to the emergency department.  Pager Numbers  - Dr. Nehemiah Massed: [phone]  - Dr. Laurence Ferrari: [phone]  - Dr. Nicole Kindred: [phone]  In the event of inclement weather, please call our main line at [phone] for an update on the status of any delays or closures.  Dermatology Medication Tips: Please keep the boxes that topical medications come in in order to help keep track of the instructions about where and how to use these. Pharmacies typically print the medication instructions only on the boxes and not directly on the medication tubes.   If your medication is too expensive, please contact our office at [phone] option 4 or send [place] a message through [place].   We are unable to tell what your co-pay for medications will be in advance as this is different depending on your insurance coverage. However, we may be able to find a substitute medication at lower cost or fill out  paperwork to get insurance to cover a needed medication.   If a prior authorization is required to get your medication covered by your insurance company, please allow [place] 1-2 business days to complete this process.  Drug prices often vary depending on where the prescription is filled and some pharmacies may offer cheaper prices.  The website www.goodrx.com contains coupons for medications through different pharmacies. The prices here do not account for what the cost may be with help from insurance (it may be cheaper with your insurance), but the website can give you the price if you did not use any insurance.  - You can print the associated coupon and take it with your prescription to the pharmacy.  - You may also stop by our office during regular business hours and pick up a GoodRx coupon card.  - If you need your prescription sent electronically to a different pharmacy, notify our office  through [facility] or by phone at [phone] option 4.     Si Usted Necesita Algo Despus de Su Visita  Tambin puede enviarnos un mensaje a travs de Pharmacist, community. Por lo general respondemos a los mensajes de MyChart en el transcurso de 1 a 2 das hbiles.  Para renovar recetas, por favor pida a su farmacia que se ponga en contacto con nuestra oficina. Harland Dingwall de fax es [place] [phone].  Si tiene un asunto urgente cuando la clnica est cerrada y que no puede esperar hasta el siguiente da hbil, puede llamar/localizar a su doctor(a) al nmero que aparece a continuacin.   Por favor, tenga en cuenta que aunque hacemos todo lo posible para estar disponibles para asuntos urgentes fuera del horario de [place], no estamos disponibles las 24 horas del da, los 7 das de la [place].   Si tiene un problema urgente y no puede comunicarse con nosotros, puede optar por buscar atencin mdica  en el consultorio de su doctor(a), en una clnica privada, en un centro de atencin urgente o en una sala de  emergencias.  Si tiene Engineering geologist, por favor llame inmediatamente al 911 o vaya a la sala de emergencias.  Nmeros de bper  - Dr. Nehemiah Massed: [phone]  - Dra. Moye: [phone]  - Dra. Nicole Kindred: [phone]  En caso de inclemencias del [place], por favor llame a Johnsie Kindred principal al [phone] para una actualizacin sobre el [place] de cualquier retraso o cierre.  Consejos para la medicacin en dermatologa: Por favor, guarde las cajas en las que vienen los medicamentos de uso tpico para ayudarle a seguir las instrucciones sobre dnde y cmo usarlos. Las farmacias generalmente imprimen las instrucciones del medicamento slo en las cajas y no directamente en los tubos del [place].   Si su medicamento es muy caro, por favor, pngase en contacto con Zigmund Daniel llamando al [phone] y presione la opcin 4 o envenos un mensaje a travs de Pharmacist, community.   No podemos decirle cul ser su copago por los medicamentos por adelantado ya que esto es diferente dependiendo de la cobertura de su seguro. Sin embargo, es posible que podamos encontrar un medicamento sustituto a Electrical engineer un formulario para que el seguro cubra el medicamento que se considera necesario.   Si se requiere una autorizacin previa para que su compaa de seguros [place] su medicamento, por favor permtanos de 1 a 2 das hbiles para completar este proceso.  Los precios de los medicamentos varan con frecuencia dependiendo del Environmental consultant de dnde se surte la receta y alguna farmacias pueden ofrecer precios ms baratos.  El sitio web www.goodrx.com tiene cupones para medicamentos de Airline pilot. Los precios aqu no tienen en cuenta lo que podra costar con la ayuda del seguro (puede ser ms barato con su seguro), pero el sitio web puede darle el precio si no utiliz Research scientist (physical sciences).  - Puede imprimir el cupn correspondiente y llevarlo con su receta a la farmacia.  - Tambin puede pasar por  nuestra oficina durante el horario de atencin regular y Charity fundraiser una tarjeta de cupones de GoodRx.  - Si necesita que su receta se enve electrnicamente a una farmacia diferente, informe a nuestra oficina a travs de MyChart de [place] o por telfono llamando al [phone] y presione la opcin 4.

## 2021-12-13 ENCOUNTER — Telehealth: Payer: Self-pay

## 2021-12-13 NOTE — Telephone Encounter (Signed)
Pt doing fine after yesterdays surgery./sh 

## 2021-12-14 ENCOUNTER — Encounter: Payer: Self-pay | Admitting: Dermatology

## 2021-12-19 ENCOUNTER — Other Ambulatory Visit: Payer: Self-pay

## 2021-12-19 ENCOUNTER — Ambulatory Visit (INDEPENDENT_AMBULATORY_CARE_PROVIDER_SITE_OTHER): Payer: Medicare Other | Admitting: Dermatology

## 2021-12-19 DIAGNOSIS — Z4802 Encounter for removal of sutures: Secondary | ICD-10-CM

## 2021-12-19 DIAGNOSIS — L720 Epidermal cyst: Secondary | ICD-10-CM

## 2021-12-19 NOTE — Progress Notes (Signed)
? ?  Follow-Up Visit ?  ?Subjective  ?Pam Cruz is a 87 y.o. female who presents for the following: Post op/suture removal (For pathology proven cyst of the glabella patient is here today for suture removal.). ? ?The following portions of the chart were reviewed this encounter and updated as appropriate:  ? Tobacco  Allergies  Meds  Problems  Med Hx  Surg Hx  Fam Hx   ?  ?Review of Systems:  No other skin or systemic complaints except as noted in HPI or Assessment and Plan. ? ?Objective  ?Well appearing patient in no apparent distress; mood and affect are within normal limits. ? ?A focused examination was performed including the face. Relevant physical exam findings are noted in the Assessment and Plan. ? ?Glabella ?Healing excision site.  ? ? ?Assessment & Plan  ?Epidermal inclusion cyst ?Glabella ? ?Encounter for Removal of Sutures ?- Incision site at the glabella is clean, dry and intact ?- Wound cleansed, sutures removed, wound cleansed and steri strips applied.  ?- Discussed pathology results showing a benign cyst.  ?- Patient advised to keep steri-strips dry until they fall off. ?- Scars remodel for a full year. ?- Once steri-strips fall off, patient can apply over-the-counter silicone scar cream each night to help with scar remodeling if desired. ?- Patient advised to call with any concerns or if they notice any new or changing lesions.  ? ?Return if symptoms worsen or fail to improve. ? ?I, Rudell Cobb, CMA, am acting as scribe for Sarina Ser, MD . ?Documentation: I have reviewed the above documentation for accuracy and completeness, and I agree with the above. ? ?Sarina Ser, MD ? ?

## 2021-12-19 NOTE — Patient Instructions (Signed)

## 2021-12-21 ENCOUNTER — Telehealth: Payer: Self-pay | Admitting: Student-PharmD

## 2021-12-21 NOTE — Progress Notes (Addendum)
General Review Call  ? ?Pam Cruz, Pam Cruz [id number] ?55 years, Female  DOB: 18-Aug-1935  M: [phone] ? ?General Review (HC) ?Completed by Charlann Lange on 12/21/2021 ? ?Chart Review ?What recent interventions/DTPs have been made by any provider to improve the patient's conditions in the last 3 months?:  ?Consults: ?12/12/21 Ezekiel Slocumb, MD. For Neoplasm of skin. STARTED Mupirocin 2% 1 application daily QD to excision site. ?12/19/21 Ezekiel Slocumb, MD For Epidermal inclusion cyst. No medication changes. ? ?Any recent hospitalizations or ED visits since last visit with CPP?: No ? ?Adherence Review ?Adherence rates for STAR metric medications: Losartan 50 mg 11/12/21 90 DS ? ?Adherence rates for medications indicated for disease state being reviewed: Losartan 50 mg 11/12/21 90 DS ? ?Does the patient have >5 day gap between last estimated fill dates for any of the above medications?: No ? ?Disease State Questions ?Able to connect with the Patient?: Yes ? ?Did patient have any problems with their health recently?: No ? ?Did patient have any problems with their pharmacy?: No ? ?Does patient have any issues or side effects with their medications?: No ? ?Additional  information to pass to Patient's CPP?: No ? ?Anything we can do to help take better care of Patient?: No ? ?Misc. Response/Information:: Patient stated she got a cyst removed on her face and her face is healing very well. The cyst was not cancerous. ? ?Engagement Notes ?Charlann Lange on 12/20/2021 03:54 PM ?[facility] Chart Review: 6 min 12/20/21 ?[facility] Assessment call time spent: 9 min 12/21/21 ?CP Review: 5 min 12/21/21 ? ?Charlann Lange, RMA ?Healthcare Concierge  ?[phone] ? ?Pharmacist Review ?Adherence gaps identified?: No ?Drug Therapy Problems identified?: No ?Assessment: Controlled ? ?Alena Bills ?Clinical Pharmacist ? ?

## 2021-12-24 ENCOUNTER — Encounter: Payer: Self-pay | Admitting: Dermatology

## 2021-12-25 ENCOUNTER — Other Ambulatory Visit: Payer: Self-pay | Admitting: Internal Medicine

## 2021-12-25 DIAGNOSIS — F411 Generalized anxiety disorder: Secondary | ICD-10-CM

## 2022-01-15 ENCOUNTER — Other Ambulatory Visit: Payer: Self-pay

## 2022-01-15 MED ORDER — LOSARTAN POTASSIUM 50 MG PO TABS: 50.0000 mg | ORAL_TABLET | Freq: Two times a day (BID) | ORAL | 1 refills | Status: DC

## 2022-01-15 MED ORDER — HYDRALAZINE HCL 25 MG PO TABS: 25.0000 mg | ORAL_TABLET | Freq: Three times a day (TID) | ORAL | 1 refills | Status: DC

## 2022-01-17 DIAGNOSIS — M79674 Pain in right toe(s): Secondary | ICD-10-CM | POA: Diagnosis not present

## 2022-01-17 DIAGNOSIS — M2042 Other hammer toe(s) (acquired), left foot: Secondary | ICD-10-CM | POA: Diagnosis not present

## 2022-01-17 DIAGNOSIS — M2012 Hallux valgus (acquired), left foot: Secondary | ICD-10-CM | POA: Diagnosis not present

## 2022-01-17 DIAGNOSIS — L603 Nail dystrophy: Secondary | ICD-10-CM | POA: Diagnosis not present

## 2022-01-17 DIAGNOSIS — M2011 Hallux valgus (acquired), right foot: Secondary | ICD-10-CM | POA: Diagnosis not present

## 2022-01-17 DIAGNOSIS — M79675 Pain in left toe(s): Secondary | ICD-10-CM | POA: Diagnosis not present

## 2022-01-17 DIAGNOSIS — B351 Tinea unguium: Secondary | ICD-10-CM | POA: Diagnosis not present

## 2022-01-17 DIAGNOSIS — L97521 Non-pressure chronic ulcer of other part of left foot limited to breakdown of skin: Secondary | ICD-10-CM | POA: Diagnosis not present

## 2022-01-17 DIAGNOSIS — M2041 Other hammer toe(s) (acquired), right foot: Secondary | ICD-10-CM | POA: Diagnosis not present

## 2022-01-17 DIAGNOSIS — M249 Joint derangement, unspecified: Secondary | ICD-10-CM | POA: Diagnosis not present

## 2022-02-01 DIAGNOSIS — H353210 Exudative age-related macular degeneration, right eye, stage unspecified: Secondary | ICD-10-CM | POA: Diagnosis not present

## 2022-02-09 ENCOUNTER — Telehealth: Payer: Self-pay

## 2022-02-09 DIAGNOSIS — I10 Essential (primary) hypertension: Secondary | ICD-10-CM | POA: Diagnosis not present

## 2022-02-09 DIAGNOSIS — Z79899 Other long term (current) drug therapy: Secondary | ICD-10-CM | POA: Diagnosis not present

## 2022-02-09 DIAGNOSIS — M810 Age-related osteoporosis without current pathological fracture: Secondary | ICD-10-CM | POA: Diagnosis not present

## 2022-02-09 DIAGNOSIS — K219 Gastro-esophageal reflux disease without esophagitis: Secondary | ICD-10-CM | POA: Diagnosis not present

## 2022-02-09 DIAGNOSIS — J449 Chronic obstructive pulmonary disease, unspecified: Secondary | ICD-10-CM | POA: Diagnosis not present

## 2022-02-09 DIAGNOSIS — R197 Diarrhea, unspecified: Secondary | ICD-10-CM | POA: Diagnosis not present

## 2022-02-09 NOTE — Telephone Encounter (Signed)
Pt called that she check her BP its elevated 179/104 and headache   try to gave her appt today she said  is not in town and has not take her Bp med yet and advised to her take medication as prescribed and also check her BP again in Half hrs and if symptoms go worse she can go to urgent care  or ED and also I gave her appt Monday  ?

## 2022-02-09 NOTE — Telephone Encounter (Signed)
As per dr Humphrey Rolls advised pt that take routine Bp med and recheck  Bp in 2 hrs  and if BP high she can take 1 tab of clonidine 0.1 mg and advised her if her BP is still high need go to ED or urgent care  ?

## 2022-02-09 NOTE — Telephone Encounter (Signed)
Lmomm to call us back  ?

## 2022-02-12 ENCOUNTER — Ambulatory Visit: Payer: Medicare Other | Admitting: Nurse Practitioner

## 2022-02-19 DIAGNOSIS — K580 Irritable bowel syndrome with diarrhea: Secondary | ICD-10-CM | POA: Diagnosis not present

## 2022-02-22 DIAGNOSIS — H40052 Ocular hypertension, left eye: Secondary | ICD-10-CM | POA: Diagnosis not present

## 2022-02-22 DIAGNOSIS — Z961 Presence of intraocular lens: Secondary | ICD-10-CM | POA: Diagnosis not present

## 2022-02-22 DIAGNOSIS — H353221 Exudative age-related macular degeneration, left eye, with active choroidal neovascularization: Secondary | ICD-10-CM | POA: Diagnosis not present

## 2022-02-23 ENCOUNTER — Telehealth: Payer: Self-pay

## 2022-02-25 ENCOUNTER — Other Ambulatory Visit: Payer: Self-pay | Admitting: Internal Medicine

## 2022-02-26 NOTE — Telephone Encounter (Signed)
Let's discuss

## 2022-02-27 ENCOUNTER — Telehealth: Payer: Self-pay

## 2022-03-07 ENCOUNTER — Ambulatory Visit: Payer: Medicare Other | Admitting: Nurse Practitioner

## 2022-03-08 ENCOUNTER — Telehealth: Payer: Self-pay

## 2022-03-08 NOTE — Telephone Encounter (Signed)
Per DFK she doesn't think pt needs desipramine dose increased, but maybe a phone call once a week to check on pt. Spoke to pt and she agreed she didn't want the dose to be increased. Pt stated she is taking 1 day at a time, we discussed her doing a stool sample for her GI doctor, and that [place] knows her pt very well, pt stated she has been seeing DFK since the late 90's wished her family well. I told pt we would talk next week.

## 2022-03-16 ENCOUNTER — Telehealth: Payer: Self-pay

## 2022-03-16 NOTE — Telephone Encounter (Signed)
Called Pam Cruz to check on her and see how she is doing. Pam Cruz fell last Saturday. Back hit the leg of a chair, but Pam Cruz has appt Monday to see Ellard Artis at [facility], orthopedic. Pam Cruz says it hurts when she coughs or lays on back. Pam Cruz still has some GI stuff going on, we discussed asking her her GI for a diet or a guide to what foods to avoid and what ones to make a part of her regular diet.

## 2022-03-17 NOTE — Telephone Encounter (Signed)
Please let her know if she feels any worse to give [place] a call.

## 2022-03-19 DIAGNOSIS — S20212A Contusion of left front wall of thorax, initial encounter: Secondary | ICD-10-CM | POA: Diagnosis not present

## 2022-03-20 ENCOUNTER — Telehealth: Payer: Self-pay

## 2022-03-20 NOTE — Telephone Encounter (Signed)
Left vm to confirm 03/27/22 appointment-Pam Cruz

## 2022-03-21 ENCOUNTER — Telehealth: Payer: Medicare Other

## 2022-03-22 ENCOUNTER — Ambulatory Visit: Payer: Medicare Other | Admitting: Nurse Practitioner

## 2022-03-27 ENCOUNTER — Encounter: Payer: Self-pay | Admitting: Internal Medicine

## 2022-03-27 ENCOUNTER — Ambulatory Visit (INDEPENDENT_AMBULATORY_CARE_PROVIDER_SITE_OTHER): Payer: Medicare Other | Admitting: Internal Medicine

## 2022-03-27 DIAGNOSIS — R300 Dysuria: Secondary | ICD-10-CM | POA: Diagnosis not present

## 2022-03-27 DIAGNOSIS — M159 Polyosteoarthritis, unspecified: Secondary | ICD-10-CM

## 2022-03-27 DIAGNOSIS — Z0001 Encounter for general adult medical examination with abnormal findings: Secondary | ICD-10-CM

## 2022-03-27 DIAGNOSIS — F411 Generalized anxiety disorder: Secondary | ICD-10-CM | POA: Diagnosis not present

## 2022-03-27 DIAGNOSIS — Z1231 Encounter for screening mammogram for malignant neoplasm of breast: Secondary | ICD-10-CM | POA: Diagnosis not present

## 2022-03-27 DIAGNOSIS — I10 Essential (primary) hypertension: Secondary | ICD-10-CM

## 2022-03-27 DIAGNOSIS — J438 Other emphysema: Secondary | ICD-10-CM | POA: Diagnosis not present

## 2022-03-27 NOTE — Progress Notes (Unsigned)
[facility] [place], [place] [address]  Internal MEDICINE  Office Visit Note  Patient Name: Pam Cruz  [id number]  [id number]  Date of Service: 03/27/2022  Chief Complaint  Patient presents with   Medicare Wellness   Gastroesophageal Reflux   Hypertension   Quality Metric Gaps    Tetanus Vaccine     HPI Pt is here for routine health maintenance examination  Current Medication: Outpatient Encounter Medications as of 03/27/2022  Medication Sig Note   acetaminophen (TYLENOL) 500 MG tablet Take 500 mg by mouth every 6 (six) hours as needed (pain).     amLODipine (NORVASC) 2.5 MG tablet TAKE 1 TABLET BY MOUTH  DAILY    bimatoprost (LUMIGAN) 0.01 % SOLN INSTILL 1 DROP IN LEFT EYE AT BEDTIME    clonazePAM (KLONOPIN) 0.5 MG tablet TAKE 1 TABLET BY MOUTH TWICE  DAILY AS NEEDED FOR ANXIETY    cloNIDine (CATAPRES) 0.1 MG tablet TAKE 1 TABLET BY MOUTH  TWICE DAILY MAY TAKE EXTRA  TABLET IF NEEDED (MAXIMUM 3 TABLETS A DAY)    desipramine (NORPRAMIN) 25 MG tablet Take 1 tablet (25 mg total) by mouth daily.    Homeopathic Products ([place]) FOAM Apply 2 application topically See admin instructions. Apply two applications to various areas of the body at bedtime according to bottle instructions 01/29/2020: Medication at bedside    hydrALAZINE (APRESOLINE) 25 MG tablet Take 1 tablet (25 mg total) by mouth 3 (three) times daily.    levothyroxine (SYNTHROID) 50 MCG tablet Take 1 tablet (50 mcg total) by mouth daily.    losartan (COZAAR) 50 MG tablet Take 1 tablet (50 mg total) by mouth 2 (two) times daily.    Multiple Vitamins-Minerals (PRESERVISION AREDS 2+MULTI VIT) CAPS Take 1 capsule by mouth in the morning and at bedtime.    mupirocin ointment (BACTROBAN) 2 % Apply 1 application topically daily. Qd to excision site    omeprazole (PRILOSEC) 20 MG capsule Take 20 mg by mouth 2 (two) times daily before a meal.     Polyethyl Glycol-Propyl Glycol (SYSTANE)  0.4-0.3 % SOLN Apply to eye.    Polyethylene Glycol 400 (BLINK TEARS) 0.25 % SOLN Apply to eye.    Psyllium (METAMUCIL) 28.3 % POWD Take by mouth.    WIXELA INHUB 250-50 MCG/ACT AEPB INHALE 1 INHALATION BY  MOUTH INTO THE LUNGS IN THE MORNING AND AT BEDTIME    [DISCONTINUED] docusate sodium (COLACE) 100 MG capsule Take 100 mg by mouth 2 (two) times daily.    No facility-administered encounter medications on file as of 03/27/2022.    Surgical History: Past Surgical History:  Procedure Laterality Date   COLONOSCOPY     cysto     LAPAROSCOPY     TOTAL HIP ARTHROPLASTY Right 08/17/2019   Procedure: TOTAL HIP ARTHROPLASTY;  Surgeon: Dereck Leep, MD;  Location: [facility] ORS;  Service: Orthopedics;  Laterality: Right;    Medical History: Past Medical History:  Diagnosis Date   Anxiety    Arthritis    COPD (chronic obstructive pulmonary disease) ([place])    Dyspnea    Gastritis    GERD (gastroesophageal reflux disease)    Hypertension    Osteoporosis     Family History: Family History  Problem Relation Age of Onset   Hypertension Mother    Coronary artery disease Father    Heart disease Father     Social History: Social History   Socioeconomic History   Marital status: Single  Spouse name: Not on file   Number of children: Not on file   Years of education: Not on file   Highest education level: Not on file  Occupational History   Not on file  Tobacco Use   Smoking status: Former    Types: Cigarettes    Quit date: 02/20/2001    Years since quitting: 21.1   Smokeless tobacco: Never   Tobacco comments:    quit 17 years ago  Vaping Use   Vaping Use: Never used  Substance and Sexual Activity   Alcohol use: Yes    Alcohol/week: 2.0 standard drinks of alcohol    Types: 2 Glasses of wine per week    Comment: very rarely   Drug use: Never   Sexual activity: Not on file  Other Topics Concern   Not on file  Social History Narrative   Not on file   Social Determinants  of Health   Financial Resource Strain: Low Risk  (04/11/2021)   Overall Financial Resource Strain (CARDIA)    Difficulty of Paying Living Expenses: Not very hard  Food Insecurity: Not on file  Transportation Needs: Not on file  Physical Activity: Not on file  Stress: Not on file  Social Connections: Not on file      Review of Systems  Constitutional:  Negative for chills, fatigue and unexpected weight change.  HENT:  Positive for postnasal drip. Negative for congestion, rhinorrhea, sneezing and sore throat.   Eyes:  Negative for redness.  Respiratory:  Negative for cough, chest tightness and shortness of breath.   Cardiovascular:  Negative for chest pain and palpitations.  Gastrointestinal:  Negative for abdominal pain, constipation, diarrhea, nausea and vomiting.  Genitourinary:  Negative for dysuria and frequency.  Musculoskeletal:  Negative for arthralgias, back pain, joint swelling and neck pain.  Skin:  Negative for rash.  Neurological: Negative.  Negative for tremors and numbness.  Hematological:  Negative for adenopathy. Does not bruise/bleed easily.  Psychiatric/Behavioral:  Negative for behavioral problems (Depression), sleep disturbance and suicidal ideas. The patient is not nervous/anxious.      Vital Signs: BP (!) 173/84   Pulse 76   Temp 98.1 F (36.7 C)   Resp 16   Ht '4\' 8"'$  (1.422 m)   Wt 77 lb (34.9 kg)   SpO2 96%   BMI 17.26 kg/m    Physical Exam Constitutional:      Appearance: Normal appearance.  HENT:     Head: Normocephalic and atraumatic.     Nose: Nose normal.     Mouth/Throat:     Mouth: Mucous membranes are moist.     Pharynx: No posterior oropharyngeal erythema.  Eyes:     Extraocular Movements: Extraocular movements intact.     Pupils: Pupils are equal, round, and reactive to light.  Cardiovascular:     Pulses: Normal pulses.     Heart sounds: Normal heart sounds.  Pulmonary:     Effort: Pulmonary effort is normal.     Breath sounds:  Normal breath sounds.  Chest:  Breasts:    Right: Normal.     Left: Normal.  Neurological:     General: No focal deficit present.     Mental Status: She is alert.  Psychiatric:        Mood and Affect: Mood normal.        Behavior: Behavior normal.      Assessment/Plan: 1. Encounter for general adult medical examination with abnormal findings ***  2. GAD (generalized  anxiety disorder) ***  3. Encounter for mammogram to establish baseline mammogram *** - MM 3D SCREEN BREAST BILATERAL; Future  4. Other emphysema (HCC) ***  5. Benign hypertension ***  6. Generalized osteoarthritis of multiple sites ***  7. Dysuria *** - UA/M w/rflx Culture, Routine   General Counseling: jenelle drennon understanding of the findings of todays visit and agrees with plan of treatment. I have discussed any further diagnostic evaluation that may be needed or ordered today. We also reviewed her medications today. she has been encouraged to call the office with any questions or concerns that should arise related to todays visit.    Counseling:  [place] Controlled Substance Database was reviewed by me.  Orders Placed This Encounter  Procedures   MM 3D SCREEN BREAST BILATERAL   UA/M w/rflx Culture, Routine    No orders of the defined types were placed in this encounter.   Total time spent:45 Minutes  Time spent includes review of chart, medications, test results, and follow up plan with the patient.     Lavera Guise, MD  Internal Medicine

## 2022-03-28 LAB — UA/M W/RFLX CULTURE, ROUTINE
Bilirubin, UA: NEGATIVE
Glucose, UA: NEGATIVE
Ketones, UA: NEGATIVE
Leukocytes,UA: NEGATIVE
Nitrite, UA: NEGATIVE
Protein,UA: NEGATIVE
RBC, UA: NEGATIVE
Specific Gravity, UA: 1.01 (ref 1.005–1.030)
Urobilinogen, Ur: 0.2 mg/dL (ref 0.2–1.0)
pH, UA: 5.5 (ref 5.0–7.5)

## 2022-03-28 LAB — MICROSCOPIC EXAMINATION
Bacteria, UA: NONE SEEN
Casts: NONE SEEN /lpf
Epithelial Cells (non renal): NONE SEEN /hpf (ref 0–10)
RBC, Urine: NONE SEEN /hpf (ref 0–2)
WBC, UA: NONE SEEN /hpf (ref 0–5)

## 2022-03-31 ENCOUNTER — Other Ambulatory Visit: Payer: Self-pay | Admitting: Internal Medicine

## 2022-03-31 DIAGNOSIS — I10 Essential (primary) hypertension: Secondary | ICD-10-CM

## 2022-04-10 ENCOUNTER — Other Ambulatory Visit: Payer: Self-pay | Admitting: Internal Medicine

## 2022-04-11 ENCOUNTER — Other Ambulatory Visit: Payer: Self-pay

## 2022-04-11 DIAGNOSIS — I10 Essential (primary) hypertension: Secondary | ICD-10-CM

## 2022-04-11 MED ORDER — CLONIDINE HCL 0.1 MG PO TABS: ORAL_TABLET | ORAL | 2 refills | Status: DC

## 2022-04-24 DIAGNOSIS — K580 Irritable bowel syndrome with diarrhea: Secondary | ICD-10-CM | POA: Diagnosis not present

## 2022-05-02 ENCOUNTER — Other Ambulatory Visit: Payer: Self-pay

## 2022-05-03 ENCOUNTER — Other Ambulatory Visit: Payer: Self-pay

## 2022-05-03 DIAGNOSIS — F411 Generalized anxiety disorder: Secondary | ICD-10-CM

## 2022-05-03 MED ORDER — CLONAZEPAM 0.5 MG PO TABS: 0.5000 mg | ORAL_TABLET | Freq: Two times a day (BID) | ORAL | 0 refills | Status: DC | PRN

## 2022-05-03 MED ORDER — LOSARTAN POTASSIUM 50 MG PO TABS: 50.0000 mg | ORAL_TABLET | Freq: Two times a day (BID) | ORAL | 1 refills | Status: DC

## 2022-05-03 MED ORDER — HYDRALAZINE HCL 25 MG PO TABS: 25.0000 mg | ORAL_TABLET | Freq: Three times a day (TID) | ORAL | 1 refills | Status: DC

## 2022-05-09 ENCOUNTER — Ambulatory Visit
Admission: RE | Admit: 2022-05-09 | Discharge: 2022-05-09 | Disposition: A | Discharge status: Home / Self Care | Payer: Medicare Other | Source: Ambulatory Visit | Attending: Internal Medicine | Admitting: Internal Medicine

## 2022-05-09 DIAGNOSIS — M79674 Pain in right toe(s): Secondary | ICD-10-CM | POA: Diagnosis not present

## 2022-05-09 DIAGNOSIS — R300 Dysuria: Secondary | ICD-10-CM

## 2022-05-09 DIAGNOSIS — I10 Essential (primary) hypertension: Secondary | ICD-10-CM

## 2022-05-09 DIAGNOSIS — L851 Acquired keratosis [keratoderma] palmaris et plantaris: Secondary | ICD-10-CM | POA: Diagnosis not present

## 2022-05-09 DIAGNOSIS — M159 Polyosteoarthritis, unspecified: Secondary | ICD-10-CM

## 2022-05-09 DIAGNOSIS — F411 Generalized anxiety disorder: Secondary | ICD-10-CM

## 2022-05-09 DIAGNOSIS — J438 Other emphysema: Secondary | ICD-10-CM | POA: Diagnosis not present

## 2022-05-09 DIAGNOSIS — Z0001 Encounter for general adult medical examination with abnormal findings: Secondary | ICD-10-CM | POA: Diagnosis not present

## 2022-05-09 DIAGNOSIS — B351 Tinea unguium: Secondary | ICD-10-CM | POA: Diagnosis not present

## 2022-05-09 DIAGNOSIS — Z1231 Encounter for screening mammogram for malignant neoplasm of breast: Secondary | ICD-10-CM | POA: Diagnosis not present

## 2022-05-09 DIAGNOSIS — M79675 Pain in left toe(s): Secondary | ICD-10-CM | POA: Diagnosis not present

## 2022-05-25 DIAGNOSIS — H35372 Puckering of macula, left eye: Secondary | ICD-10-CM | POA: Diagnosis not present

## 2022-05-25 DIAGNOSIS — H353210 Exudative age-related macular degeneration, right eye, stage unspecified: Secondary | ICD-10-CM | POA: Diagnosis not present

## 2022-07-07 ENCOUNTER — Other Ambulatory Visit: Payer: Self-pay | Admitting: Internal Medicine

## 2022-07-09 ENCOUNTER — Other Ambulatory Visit: Payer: Self-pay

## 2022-07-09 MED ORDER — DESIPRAMINE HCL 25 MG PO TABS: 25.0000 mg | ORAL_TABLET | Freq: Every day | ORAL | 3 refills | Status: DC

## 2022-07-09 MED ORDER — DESIPRAMINE HCL 25 MG PO TABS: 25.0000 mg | ORAL_TABLET | Freq: Every day | ORAL | 0 refills | Status: DC

## 2022-07-24 DIAGNOSIS — Z96641 Presence of right artificial hip joint: Secondary | ICD-10-CM | POA: Diagnosis not present

## 2022-08-24 DIAGNOSIS — H353211 Exudative age-related macular degeneration, right eye, with active choroidal neovascularization: Secondary | ICD-10-CM | POA: Diagnosis not present

## 2022-08-24 DIAGNOSIS — H353210 Exudative age-related macular degeneration, right eye, stage unspecified: Secondary | ICD-10-CM | POA: Diagnosis not present

## 2022-09-04 ENCOUNTER — Other Ambulatory Visit: Payer: Self-pay

## 2022-09-04 DIAGNOSIS — F411 Generalized anxiety disorder: Secondary | ICD-10-CM

## 2022-09-04 MED ORDER — CLONAZEPAM 0.5 MG PO TABS: 0.5000 mg | ORAL_TABLET | Freq: Two times a day (BID) | ORAL | 0 refills | Status: DC | PRN

## 2022-09-24 ENCOUNTER — Other Ambulatory Visit: Payer: Self-pay

## 2022-09-24 MED ORDER — LEVOTHYROXINE SODIUM 50 MCG PO TABS: 50.0000 ug | ORAL_TABLET | Freq: Every day | ORAL | 3 refills | Status: DC

## 2022-09-25 ENCOUNTER — Ambulatory Visit (INDEPENDENT_AMBULATORY_CARE_PROVIDER_SITE_OTHER): Payer: Medicare Other | Admitting: Nurse Practitioner

## 2022-09-25 ENCOUNTER — Encounter: Payer: Self-pay | Admitting: Nurse Practitioner

## 2022-09-25 VITALS — BP 140/78 | HR 82 | Temp 97.5°F | Resp 16 | Ht <= 58 in | Wt 74.8 lb

## 2022-09-25 DIAGNOSIS — K588 Other irritable bowel syndrome: Secondary | ICD-10-CM | POA: Diagnosis not present

## 2022-09-25 DIAGNOSIS — I10 Essential (primary) hypertension: Secondary | ICD-10-CM

## 2022-09-25 DIAGNOSIS — J438 Other emphysema: Secondary | ICD-10-CM | POA: Diagnosis not present

## 2022-09-25 MED ORDER — UMECLIDINIUM-VILANTEROL 62.5-25 MCG/ACT IN AEPB: 1.0000 | INHALATION_SPRAY | Freq: Every day | RESPIRATORY_TRACT | 5 refills | Status: DC

## 2022-09-25 NOTE — Progress Notes (Signed)
[facility] [place], [place] [address]  Internal MEDICINE  Office Visit Note  Patient Name: Pam Cruz  [id number]  [id number]  Date of Service: 09/25/2022  Chief Complaint  Patient presents with   Follow-up   Gastroesophageal Reflux   Hypertension    HPI Pam Cruz presents for a follow up visit for mild COPD, hypertension, and IBC and constipation COPD -- does not like wixela, feels like it makes her cough up a lot of sputum every morning, wants to try something different, possibly that is once daily instead of twice daily. Hypertension -- BP is stable. IBS and constipation -- cannot eat healthy foods, doesn't cook much. Often feels bloated and constipated. Wants to know what she can eat and what to avoid.       Current Medication: Outpatient Encounter Medications as of 09/25/2022  Medication Sig Note   acetaminophen (TYLENOL) 500 MG tablet Take 500 mg by mouth every 6 (six) hours as needed (pain).     amLODipine (NORVASC) 2.5 MG tablet TAKE 1 TABLET BY MOUTH  DAILY    bimatoprost (LUMIGAN) 0.01 % SOLN INSTILL 1 DROP IN LEFT EYE AT BEDTIME    clonazePAM (KLONOPIN) 0.5 MG tablet Take 1 tablet (0.5 mg total) by mouth 2 (two) times daily as needed. for anxiety    cloNIDine (CATAPRES) 0.1 MG tablet TAKE 1 TABLET BY MOUTH TWICE  DAILY MAY TAKE EXTRA TABLET IF  NEEDED , MAXIMUM 3 TABLETS A DAY    desipramine (NORPRAMIN) 25 MG tablet Take 1 tablet (25 mg total) by mouth daily.    Homeopathic Products ([place]) FOAM Apply 2 application topically See admin instructions. Apply two applications to various areas of the body at bedtime according to bottle instructions 01/29/2020: Medication at bedside    hydrALAZINE (APRESOLINE) 25 MG tablet Take 1 tablet (25 mg total) by mouth 3 (three) times daily.    levothyroxine (SYNTHROID) 50 MCG tablet Take 1 tablet (50 mcg total) by mouth daily.    losartan (COZAAR) 50 MG tablet Take 1 tablet (50 mg total) by mouth  2 (two) times daily.    Multiple Vitamins-Minerals (PRESERVISION AREDS 2+MULTI VIT) CAPS Take 1 capsule by mouth in the morning and at bedtime.    mupirocin ointment (BACTROBAN) 2 % Apply 1 application topically daily. Qd to excision site    omeprazole (PRILOSEC) 20 MG capsule Take 20 mg by mouth 2 (two) times daily before a meal.     Polyethyl Glycol-Propyl Glycol (SYSTANE) 0.4-0.3 % SOLN Apply to eye.    Polyethylene Glycol 400 (BLINK TEARS) 0.25 % SOLN Apply to eye.    Psyllium (METAMUCIL) 28.3 % POWD Take by mouth.    umeclidinium-vilanterol (ANORO ELLIPTA) 62.5-25 MCG/ACT AEPB Inhale 1 puff into the lungs daily.    WIXELA INHUB 250-50 MCG/ACT AEPB INHALE 1 INHALATION BY  MOUTH INTO THE LUNGS IN THE MORNING AND AT BEDTIME    No facility-administered encounter medications on file as of 09/25/2022.    Surgical History: Past Surgical History:  Procedure Laterality Date   COLONOSCOPY     cysto     LAPAROSCOPY     TOTAL HIP ARTHROPLASTY Right 08/17/2019   Procedure: TOTAL HIP ARTHROPLASTY;  Surgeon: Dereck Leep, MD;  Location: [facility] ORS;  Service: Orthopedics;  Laterality: Right;    Medical History: Past Medical History:  Diagnosis Date   Anxiety    Arthritis    COPD (chronic obstructive pulmonary disease) (HCC)    Dyspnea  Gastritis    GERD (gastroesophageal reflux disease)    Hypertension    Osteoporosis     Family History: Family History  Problem Relation Age of Onset   Hypertension Mother    Coronary artery disease Father    Heart disease Father    Breast cancer Neg Hx     Social History   Socioeconomic History   Marital status: Single    Spouse name: Not on file   Number of children: Not on file   Years of education: Not on file   Highest education level: Not on file  Occupational History   Not on file  Tobacco Use   Smoking status: Former    Types: Cigarettes    Quit date: 02/20/2001    Years since quitting: 21.6   Smokeless tobacco: Never   Tobacco  comments:    quit 17 years ago  Vaping Use   Vaping Use: Never used  Substance and Sexual Activity   Alcohol use: Not Currently    Alcohol/week: 2.0 standard drinks of alcohol    Types: 2 Glasses of wine per week    Comment: very rarely   Drug use: Never   Sexual activity: Not on file  Other Topics Concern   Not on file  Social History Narrative   Not on file   Social Determinants of Health   Financial Resource Strain: Low Risk  (04/11/2021)   Overall Financial Resource Strain (CARDIA)    Difficulty of Paying Living Expenses: Not very hard  Food Insecurity: Not on file  Transportation Needs: Not on file  Physical Activity: Not on file  Stress: Not on file  Social Connections: Not on file  Intimate Partner Violence: Not on file      Review of Systems  Constitutional:  Negative for chills, fatigue and unexpected weight change.  HENT:  Negative for congestion, postnasal drip, rhinorrhea, sneezing and sore throat.   Eyes:  Negative for redness.  Respiratory:  Positive for cough, shortness of breath and wheezing (intermittent). Negative for chest tightness.   Cardiovascular: Negative.  Negative for chest pain and palpitations.  Gastrointestinal:  Positive for constipation. Negative for abdominal pain, diarrhea, nausea and vomiting.  Genitourinary:  Negative for dysuria and frequency.  Musculoskeletal:  Negative for arthralgias, back pain, joint swelling and neck pain.  Skin:  Negative for rash.  Neurological: Negative.  Negative for tremors and numbness.  Hematological:  Negative for adenopathy. Does not bruise/bleed easily.  Psychiatric/Behavioral:  Negative for behavioral problems (Depression), sleep disturbance and suicidal ideas. The patient is not nervous/anxious.     Vital Signs: BP (!) 140/78   Pulse 82   Temp (!) 97.5 F (36.4 C)   Resp 16   Ht '4\' 8"'$  (1.422 m)   Wt 74 lb 12.8 oz (33.9 kg)   SpO2 99%   BMI 16.77 kg/m    Physical Exam Vitals reviewed.   Constitutional:      General: She is not in acute distress.    Appearance: Normal appearance. She is underweight. She is not ill-appearing.  HENT:     Head: Normocephalic and atraumatic.  Eyes:     Pupils: Pupils are equal, round, and reactive to light.  Cardiovascular:     Rate and Rhythm: Normal rate and regular rhythm.  Pulmonary:     Effort: Pulmonary effort is normal. No respiratory distress.  Neurological:     Mental Status: She is alert and oriented to person, place, and time.  Psychiatric:  Mood and Affect: Mood normal.        Behavior: Behavior normal.        Assessment/Plan: 1. Other emphysema ([place]) Start anoro ellipta once daily as prescribed. Discontinue wixela.   2. Other irritable bowel syndrome Discussed diet for IBS and FODMAP diet, handouts provided to patient.   3. Essential hypertension Stable, continue medications as prescribed    General Counseling: cydney alvarenga understanding of the findings of todays visit and agrees with plan of treatment. I have discussed any further diagnostic evaluation that may be needed or ordered today. We also reviewed her medications today. she has been encouraged to call the office with any questions or concerns that should arise related to todays visit.    No orders of the defined types were placed in this encounter.   Meds ordered this encounter  Medications   umeclidinium-vilanterol (ANORO ELLIPTA) 62.5-25 MCG/ACT AEPB    Sig: Inhale 1 puff into the lungs daily.    Dispense:  60 each    Refill:  5    Discontinue wixela, send new medication asap.    Return in about 6 months (around 03/27/2023) for previously scheduled, CPE, Mellina Benison PCP.   Total time spent:30 Minutes Time spent includes review of chart, medications, test results, and follow up plan with the patient.   [place] Controlled Substance Database was reviewed by me.  This patient was seen by Jonetta Osgood, FNP-C in collaboration with Dr. [name] as a part of collaborative care agreement.   Iyan Flett R. Valetta Fuller, MSN, FNP-C Internal medicine

## 2022-09-27 ENCOUNTER — Other Ambulatory Visit: Payer: Self-pay

## 2022-09-27 DIAGNOSIS — H353221 Exudative age-related macular degeneration, left eye, with active choroidal neovascularization: Secondary | ICD-10-CM | POA: Diagnosis not present

## 2022-09-27 MED ORDER — LOSARTAN POTASSIUM 50 MG PO TABS: 50.0000 mg | ORAL_TABLET | Freq: Two times a day (BID) | ORAL | 1 refills | Status: DC

## 2022-09-30 ENCOUNTER — Other Ambulatory Visit: Payer: Self-pay | Admitting: Internal Medicine

## 2022-10-01 NOTE — Telephone Encounter (Signed)
Please review

## 2022-10-18 ENCOUNTER — Other Ambulatory Visit: Payer: Self-pay | Admitting: Internal Medicine

## 2022-10-26 IMAGING — RF DG ESOPHAGUS
9 series · 14 of 24 positions shown · non-contrast
Comparison: None.

CLINICAL DATA: Gastroesophageal reflux.

EXAM:
ESOPHOGRAM / BARIUM SWALLOW / BARIUM TABLET STUDY
TECHNIQUE: Combined double contrast and single contrast examination performed
using effervescent crystals, thick barium liquid, and thin barium
liquid. The patient was observed with fluoroscopy swallowing a 13 mm
barium sulphate tablet.
FLUOROSCOPY TIME:  Fluoroscopy Time:  1 minute 48 seconds
Radiation Exposure Index (if provided by the fluoroscopic device):
9.3 mGy
Number of Acquired Spot Images: 0

[Series 1: cp_standard · 0.17mm/px · 1 of 1 slices shown (1 of 8)]
[im 1/1]
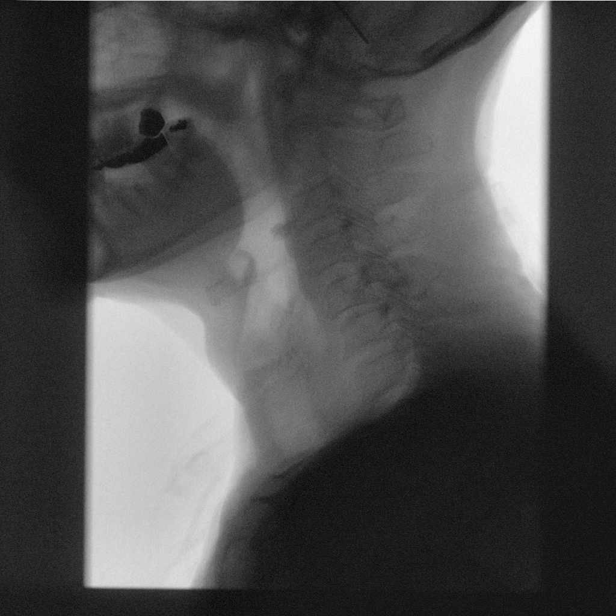

[Series 2: cp_standard · 0.17mm/px · 1 of 92 frames shown (2 of 8)]
[frame 47/92]
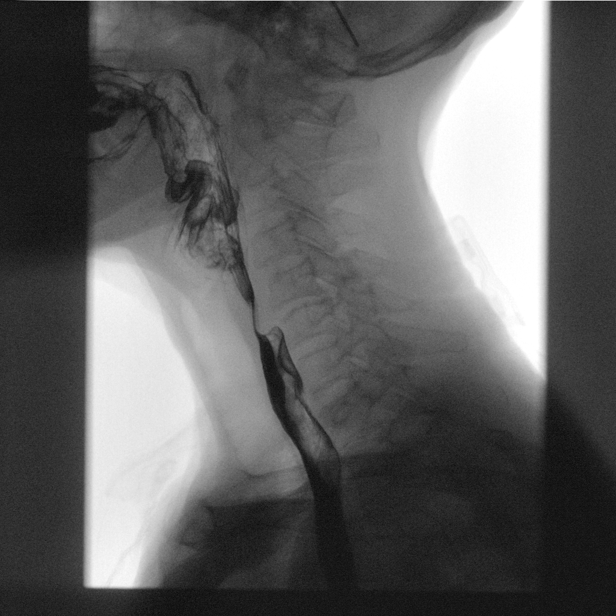

[Series 3: cp_standard · 0.17mm/px · 2 of 47 frames shown (3 of 8)]
[frame 14/47]
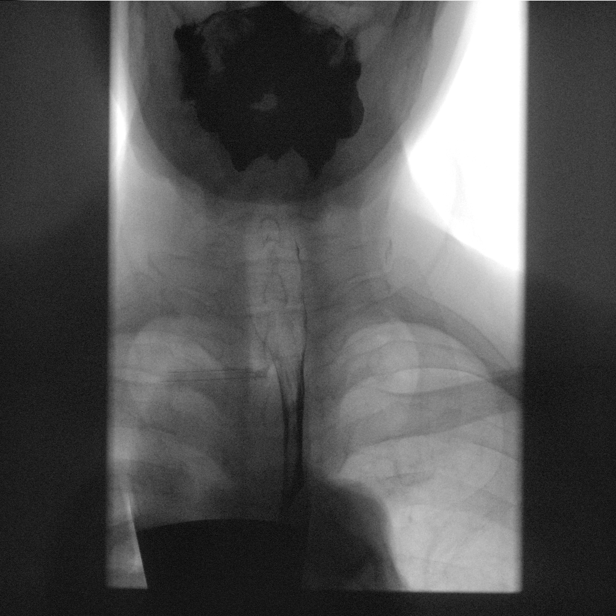
[frame 40/47]
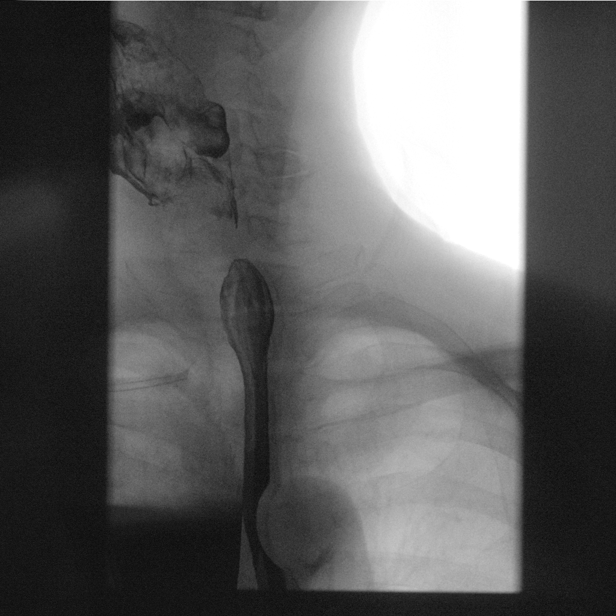

[Series 4: cp_standard · 0.17mm/px · 1 of 57 frames shown (4 of 8)]
[frame 9/57]
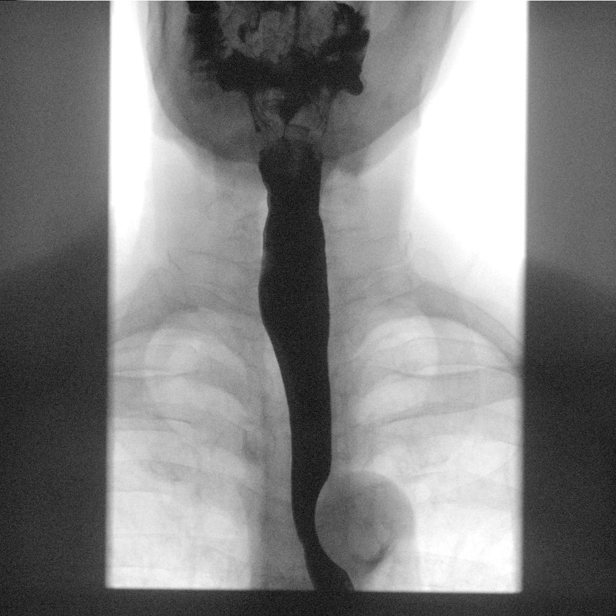

[Series 5: cp_standard · 0.26mm/px · 2 of 85 frames shown (5 of 8)]
[frame 7/85]
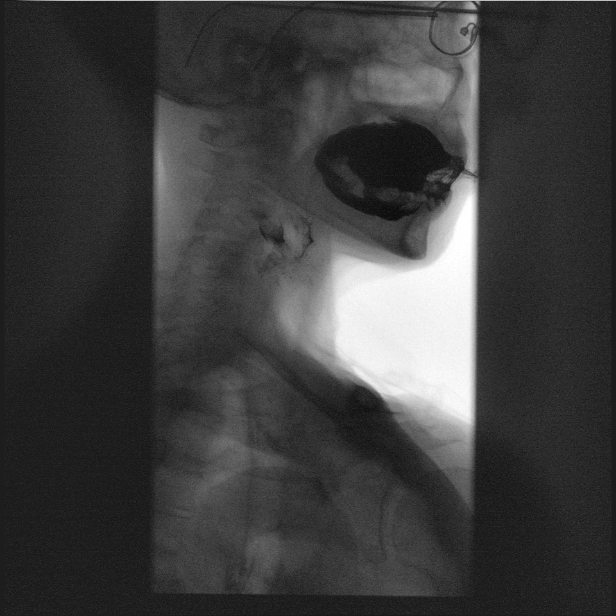
[frame 43/85]
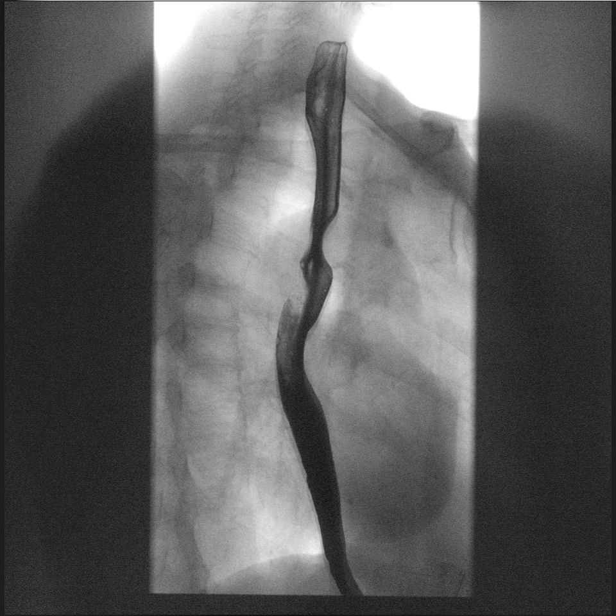

[Series 6: fluoro_barium 2fps_bw · 0.17mm/px · 2 of 18 frames shown]
[frame 3/18]
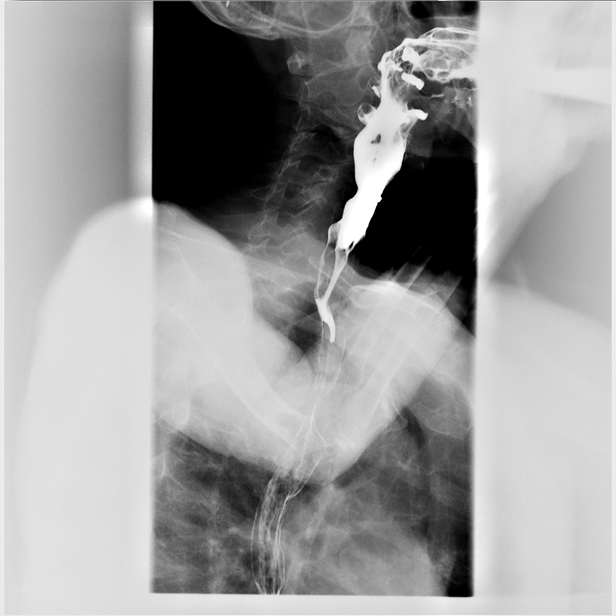
[frame 10/18]
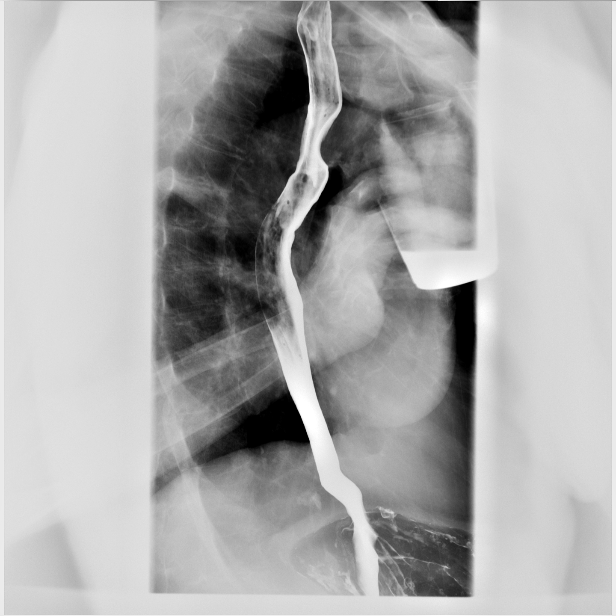

[Series 7: cp_standard · 0.26mm/px · 1 of 58 frames shown (6 of 8)]
[frame 15/58]
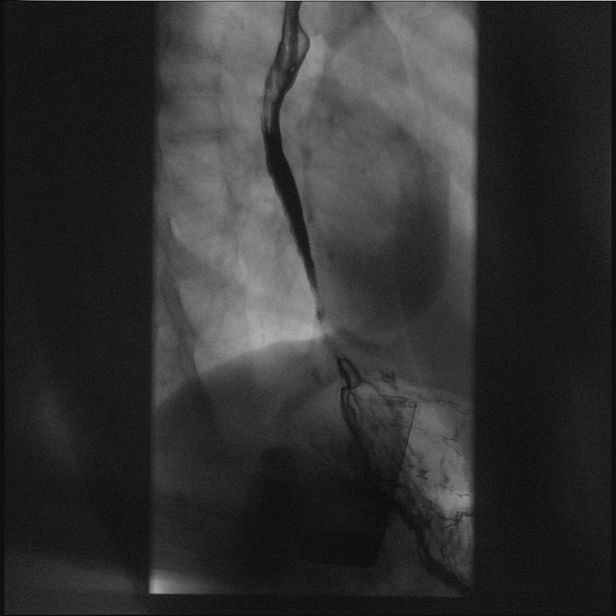

[Series 8: cp_standard · 0.26mm/px · 2 of 81 frames shown (7 of 8)]
[frame 13/81]
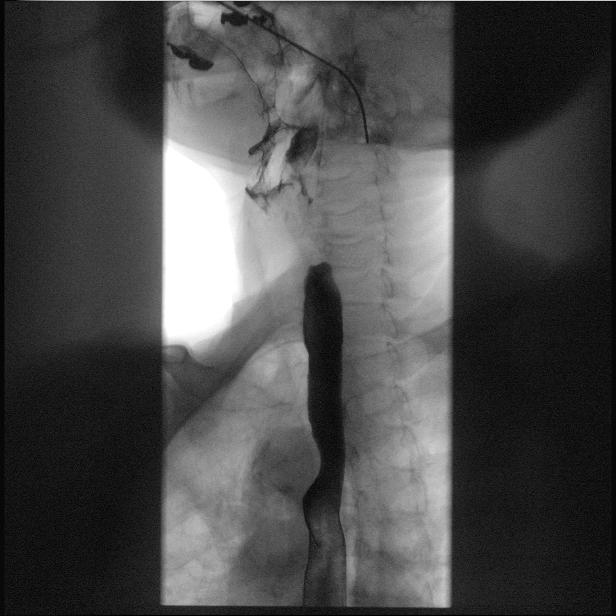
[frame 41/81]
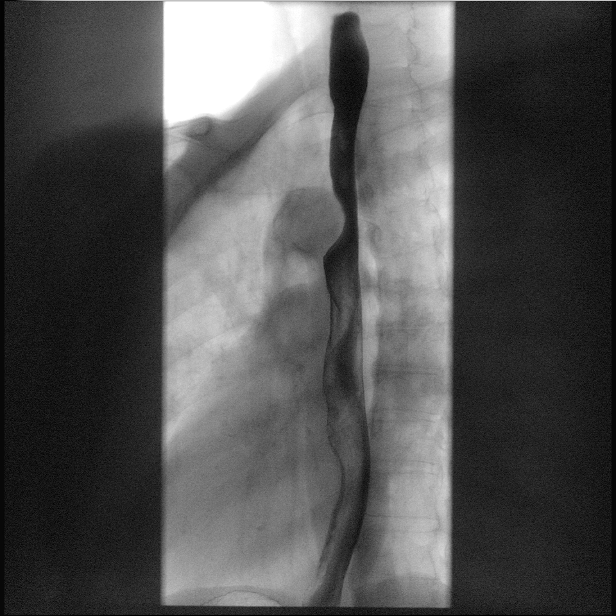

[Series 9: cp_standard · 0.26mm/px · 2 of 55 frames shown (8 of 8)]
[frame 9/55]
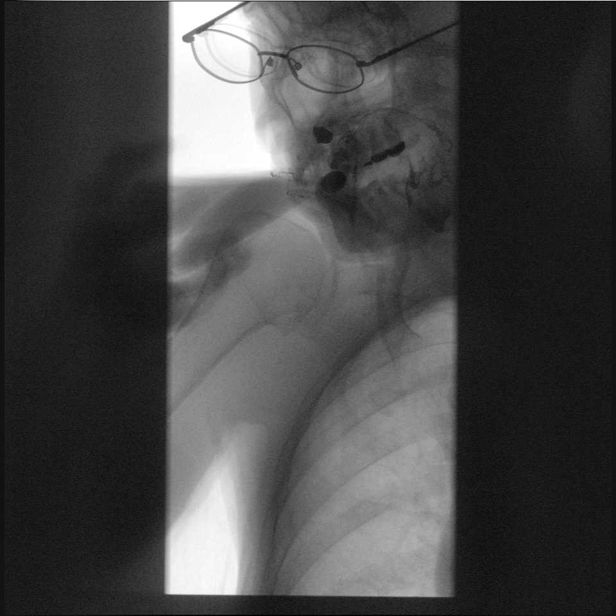
[frame 47/55]
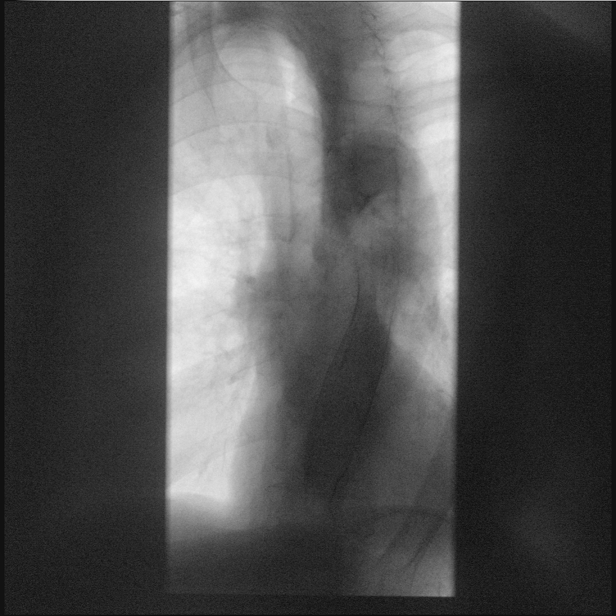

[14 of 24 positions shown; findings below may reference images not displayed]

FINDINGS: Normal pharyngeal anatomy and motility. Contrast flowed freely
through the esophagus without evidence of a stricture or mass.
Normal esophageal mucosa without evidence of irregularity or
ulceration. Esophageal motility was normal. Gastroesophageal reflux.
No definite hiatal hernia was demonstrated.

At the end of the examination a 13 mm barium tablet was administered
which transited through the esophagus and esophagogastric junction
without delay.
IMPRESSION: 1. Gastroesophageal reflux.
2. Otherwise normal barium swallow.

## 2022-10-30 DIAGNOSIS — K219 Gastro-esophageal reflux disease without esophagitis: Secondary | ICD-10-CM | POA: Diagnosis not present

## 2022-10-30 DIAGNOSIS — K581 Irritable bowel syndrome with constipation: Secondary | ICD-10-CM | POA: Diagnosis not present

## 2022-10-31 ENCOUNTER — Ambulatory Visit: Payer: Medicaid Other | Admitting: Dermatology

## 2022-11-06 DIAGNOSIS — M25531 Pain in right wrist: Secondary | ICD-10-CM | POA: Diagnosis not present

## 2022-11-06 DIAGNOSIS — S63501A Unspecified sprain of right wrist, initial encounter: Secondary | ICD-10-CM | POA: Diagnosis not present

## 2022-11-19 DIAGNOSIS — S63501A Unspecified sprain of right wrist, initial encounter: Secondary | ICD-10-CM | POA: Diagnosis not present

## 2022-11-27 DIAGNOSIS — Z961 Presence of intraocular lens: Secondary | ICD-10-CM | POA: Diagnosis not present

## 2022-11-27 DIAGNOSIS — H43813 Vitreous degeneration, bilateral: Secondary | ICD-10-CM | POA: Diagnosis not present

## 2022-11-27 DIAGNOSIS — H353211 Exudative age-related macular degeneration, right eye, with active choroidal neovascularization: Secondary | ICD-10-CM | POA: Diagnosis not present

## 2022-11-27 DIAGNOSIS — H40053 Ocular hypertension, bilateral: Secondary | ICD-10-CM | POA: Diagnosis not present

## 2022-12-05 ENCOUNTER — Other Ambulatory Visit: Payer: Self-pay | Admitting: Nurse Practitioner

## 2022-12-10 DIAGNOSIS — M13831 Other specified arthritis, right wrist: Secondary | ICD-10-CM | POA: Diagnosis not present

## 2022-12-14 ENCOUNTER — Other Ambulatory Visit: Payer: Self-pay | Admitting: Nurse Practitioner

## 2022-12-14 DIAGNOSIS — M79674 Pain in right toe(s): Secondary | ICD-10-CM | POA: Diagnosis not present

## 2022-12-14 DIAGNOSIS — B351 Tinea unguium: Secondary | ICD-10-CM | POA: Diagnosis not present

## 2022-12-14 DIAGNOSIS — M79675 Pain in left toe(s): Secondary | ICD-10-CM | POA: Diagnosis not present

## 2022-12-14 DIAGNOSIS — L851 Acquired keratosis [keratoderma] palmaris et plantaris: Secondary | ICD-10-CM | POA: Diagnosis not present

## 2022-12-20 ENCOUNTER — Ambulatory Visit (INDEPENDENT_AMBULATORY_CARE_PROVIDER_SITE_OTHER): Payer: 59 | Admitting: Dermatology

## 2022-12-20 VITALS — BP 135/77 | HR 77

## 2022-12-20 DIAGNOSIS — D229 Melanocytic nevi, unspecified: Secondary | ICD-10-CM

## 2022-12-20 DIAGNOSIS — L209 Atopic dermatitis, unspecified: Secondary | ICD-10-CM

## 2022-12-20 DIAGNOSIS — D692 Other nonthrombocytopenic purpura: Secondary | ICD-10-CM

## 2022-12-20 DIAGNOSIS — D485 Neoplasm of uncertain behavior of skin: Secondary | ICD-10-CM

## 2022-12-20 DIAGNOSIS — L578 Other skin changes due to chronic exposure to nonionizing radiation: Secondary | ICD-10-CM | POA: Diagnosis not present

## 2022-12-20 DIAGNOSIS — L814 Other melanin hyperpigmentation: Secondary | ICD-10-CM

## 2022-12-20 DIAGNOSIS — Z1283 Encounter for screening for malignant neoplasm of skin: Secondary | ICD-10-CM

## 2022-12-20 DIAGNOSIS — L821 Other seborrheic keratosis: Secondary | ICD-10-CM

## 2022-12-20 DIAGNOSIS — Z808 Family history of malignant neoplasm of other organs or systems: Secondary | ICD-10-CM

## 2022-12-20 DIAGNOSIS — D1801 Hemangioma of skin and subcutaneous tissue: Secondary | ICD-10-CM

## 2022-12-20 DIAGNOSIS — L722 Steatocystoma multiplex: Secondary | ICD-10-CM | POA: Diagnosis not present

## 2022-12-20 MED ORDER — TACROLIMUS 0.1 % EX OINT: TOPICAL_OINTMENT | CUTANEOUS | 1 refills | Status: DC

## 2022-12-20 NOTE — Progress Notes (Unsigned)
Follow-Up Visit   Subjective  Pam Cruz is a 88 y.o. female who presents for the following: Annual Exam. The patient presents for Total-Body Skin Exam (TBSE) for skin cancer screening and mole check.  The patient has spots, moles and lesions to be evaluated, some may be new or changing and the patient has concerns that these could be cancer.  The following portions of the chart were reviewed this encounter and updated as appropriate:   Tobacco  Allergies  Meds  Problems  Med Hx  Surg Hx  Fam Hx     Review of Systems:  No other skin or systemic complaints except as noted in HPI or Assessment and Plan.  Objective  Well appearing patient in no apparent distress; mood and affect are within normal limits.  A full examination was performed including scalp, head, eyes, ears, nose, lips, neck, chest, axillae, abdomen, back, buttocks, bilateral upper extremities, bilateral lower extremities, hands, feet, fingers, toes, fingernails, and toenails. All findings within normal limits unless otherwise noted below.  Mid line forehead above the glabella Pink papule 0.6 cm.   Lower legs Pink patches.    Assessment & Plan  Neoplasm of uncertain behavior of skin Mid line forehead above the glabella  Epidermal / dermal shaving  Lesion diameter (cm):  0.6 Informed consent: discussed and consent obtained   Timeout: patient name, date of birth, surgical site, and procedure verified   Procedure prep:  Patient was prepped and draped in usual sterile fashion Prep type:  Isopropyl alcohol Anesthesia: the lesion was anesthetized in a standard fashion   Anesthetic:  1% lidocaine w/ epinephrine 1-100,000 buffered w/ 8.4% NaHCO3 Instrument used: flexible razor blade   Hemostasis achieved with: pressure, aluminum chloride and electrodesiccation   Outcome: patient tolerated procedure well   Post-procedure details: sterile dressing applied and wound care instructions given   Dressing type:  bandage and petrolatum    Destruction of lesion Complexity: extensive   Destruction method: electrodesiccation and curettage   Informed consent: discussed and consent obtained   Timeout:  patient name, date of birth, surgical site, and procedure verified Procedure prep:  Patient was prepped and draped in usual sterile fashion Prep type:  Isopropyl alcohol Anesthesia: the lesion was anesthetized in a standard fashion   Anesthetic:  1% lidocaine w/ epinephrine 1-100,000 buffered w/ 8.4% NaHCO3 Curettage performed in three different directions: Yes   Electrodesiccation performed over the curetted area: Yes   Lesion length (cm):  0.6 Lesion width (cm):  0.6 Margin per side (cm):  0.2 Final wound size (cm):  1 Hemostasis achieved with:  pressure, aluminum chloride and electrodesiccation Outcome: patient tolerated procedure well with no complications   Post-procedure details: sterile dressing applied and wound care instructions given   Dressing type: bandage and petrolatum    Specimen 1 - Surgical pathology Differential Diagnosis: D48.5 r/o BCC vs cyst other  ED&C today Check Margins: No  Atopic dermatitis, unspecified type Lower legs  Atopic dermatitis (eczema) is a chronic, relapsing, pruritic condition that can significantly affect quality of life. It is often associated with allergic rhinitis and/or asthma and can require treatment with topical medications, phototherapy, or in severe cases biologic injectable medication (Dupixent; Adbry) or Oral JAK inhibitors.  Start Protopic QD until rash cleared. Continue moisturizer daily.   tacrolimus (PROTOPIC) 0.1 % ointment - Lower legs Apply to itchy red patches QD PRN.  Skin cancer screening  Actinic skin damage  Lentigo  Melanocytic nevus, unspecified location  Purpura ([place])  Family history of skin cancer  Seborrheic keratosis  Hemangioma of skin   Lentigines - Scattered tan macules - Due to sun exposure -  Benign-appearing, observe - Recommend daily broad spectrum sunscreen SPF 30+ to sun-exposed areas, reapply every 2 hours as needed. - Call for any changes  Seborrheic Keratoses - Stuck-on, waxy, tan-brown papules and/or plaques  - Benign-appearing - Discussed benign etiology and prognosis. - Observe - Call for any changes  Melanocytic Nevi - Tan-brown and/or pink-flesh-colored symmetric macules and papules - Benign appearing on exam today - Observation - Call clinic for new or changing moles - Recommend daily use of broad spectrum spf 30+ sunscreen to sun-exposed areas.   Hemangiomas - Red papules - Discussed benign nature - Observe - Call for any changes  Actinic Damage - Chronic condition, secondary to cumulative UV/sun exposure - diffuse scaly erythematous macules with underlying dyspigmentation - Recommend daily broad spectrum sunscreen SPF 30+ to sun-exposed areas, reapply every 2 hours as needed.  - Staying in the shade or wearing long sleeves, sun glasses (UVA+UVB protection) and wide brim hats (4-inch brim around the entire circumference of the hat) are also recommended for sun protection.  - Call for new or changing lesions.  Family history of skin cancer - what type(s): MM - who affected: brother, maternal uncle  Purpura - Chronic; persistent and recurrent.  Treatable, but not curable. - Violaceous macules and patches - Benign - Related to trauma, age, sun damage and/or use of blood thinners, chronic use of topical and/or oral steroids - Observe - Can use OTC arnica containing moisturizer such as Dermend Bruise Formula if desired - Call for worsening or other concerns  Varicose Veins/Spider Veins - Dilated blue, purple or red veins at the lower extremities - Reassured - Smaller vessels can be treated by sclerotherapy (a procedure to inject a medicine into the veins to make them disappear) if desired, but the treatment is not covered by insurance. Larger vessels  may be covered if symptomatic and we would refer to vascular surgeon if treatment desired.  Skin cancer screening performed today.  Return in about 1 year (around 12/20/2023) for TBSE.  Luther Redo, CMA, am acting as scribe for Sarina Ser, MD . Documentation: I have reviewed the above documentation for accuracy and completeness, and I agree with the above.  Sarina Ser, MD

## 2022-12-20 NOTE — Patient Instructions (Addendum)
Wound Care Instructions  Cleanse wound gently with soap and water once a day then pat dry with clean gauze. Apply a thin coat of Petrolatum (petroleum jelly, "Vaseline") over the wound (unless you have an allergy to this). We recommend that you use a new, sterile tube of Vaseline. Do not pick or remove scabs. Do not remove the yellow or white "healing tissue" from the base of the wound.  Cover the wound with fresh, clean, nonstick gauze and secure with paper tape. You may use Band-Aids in place of gauze and tape if the wound is small enough, but would recommend trimming much of the tape off as there is often too much. Sometimes Band-Aids can irritate the skin.  You should call the office for your biopsy report after 1 week if you have not already been contacted.  If you experience any problems, such as abnormal amounts of bleeding, swelling, significant bruising, significant pain, or evidence of infection, please call the office immediately.  FOR ADULT SURGERY PATIENTS: If you need something for pain relief you may take 1 extra strength Tylenol (acetaminophen) AND 2 Ibuprofen (200mg each) together every 4 hours as needed for pain. (do not take these if you are allergic to them or if you have a reason you should not take them.) Typically, you may only need pain medication for 1 to 3 days.     Due to recent changes in healthcare laws, you may see results of your pathology and/or laboratory studies on MyChart before the doctors have had a chance to review them. We understand that in some cases there may be results that are confusing or concerning to you. Please understand that not all results are received at the same time and often the doctors may need to interpret multiple results in order to provide you with the best plan of care or course of treatment. Therefore, we ask that you please give us 2 business days to thoroughly review all your results before contacting the office for clarification. Should  we see a critical lab result, you will be contacted sooner.   If You Need Anything After Your Visit  If you have any questions or concerns for your doctor, please call our main line at [phone] and press option 4 to reach your doctor's medical assistant. If no one answers, please leave a voicemail as directed and we will return your call as soon as possible. Messages left after 4 pm will be answered the following business day.   You may also send us a message via MyChart. We typically respond to MyChart messages within 1-2 business days.  For prescription refills, please ask your pharmacy to contact our office. Our fax number is [phone].  If you have an urgent issue when the clinic is closed that cannot wait until the next business day, you can page your doctor at the number below.    Please note that while we do our best to be available for urgent issues outside of office hours, we are not available 24/7.   If you have an urgent issue and are unable to reach us, you may choose to seek medical care at your doctor's office, retail clinic, urgent care center, or emergency room.  If you have a medical emergency, please immediately call 911 or go to the emergency department.  Pager Numbers  - Dr. Kowalski: [phone]  - Dr. Moye: [phone]  - Dr. Stewart: [phone]  In the event of inclement weather, please call our main line at   [phone] for an update on the status of any delays or closures.  Dermatology Medication Tips: Please keep the boxes that topical medications come in in order to help keep track of the instructions about where and how to use these. Pharmacies typically print the medication instructions only on the boxes and not directly on the medication tubes.   If your medication is too expensive, please contact our office at [phone] option 4 or send us a message through MyChart.   We are unable to tell what your co-pay for medications will be in  advance as this is different depending on your insurance coverage. However, we may be able to find a substitute medication at lower cost or fill out paperwork to get insurance to cover a needed medication.   If a prior authorization is required to get your medication covered by your insurance company, please allow us 1-2 business days to complete this process.  Drug prices often vary depending on where the prescription is filled and some pharmacies may offer cheaper prices.  The website www.goodrx.com contains coupons for medications through different pharmacies. The prices here do not account for what the cost may be with help from insurance (it may be cheaper with your insurance), but the website can give you the price if you did not use any insurance.  - You can print the associated coupon and take it with your prescription to the pharmacy.  - You may also stop by our office during regular business hours and pick up a GoodRx coupon card.  - If you need your prescription sent electronically to a different pharmacy, notify our office through [facility] MyChart or by phone at [phone] option 4.     Si Usted Necesita Algo Despus de Su Visita  Tambin puede enviarnos un mensaje a travs de MyChart. Por lo general respondemos a los mensajes de MyChart en el transcurso de 1 a 2 das hbiles.  Para renovar recetas, por favor pida a su farmacia que se ponga en contacto con nuestra oficina. Nuestro nmero de fax es el [phone].  Si tiene un asunto urgente cuando la clnica est cerrada y que no puede esperar hasta el siguiente da hbil, puede llamar/localizar a su doctor(a) al nmero que aparece a continuacin.   Por favor, tenga en cuenta que aunque hacemos todo lo posible para estar disponibles para asuntos urgentes fuera del horario de oficina, no estamos disponibles las 24 horas del da, los 7 das de la semana.   Si tiene un problema urgente y no puede comunicarse con nosotros, puede  optar por buscar atencin mdica  en el consultorio de su doctor(a), en una clnica privada, en un centro de atencin urgente o en una sala de emergencias.  Si tiene una emergencia mdica, por favor llame inmediatamente al 911 o vaya a la sala de emergencias.  Nmeros de bper  - Dr. Kowalski: [phone]  - Dra. Moye: [phone]  - Dra. Stewart: [phone]  En caso de inclemencias del tiempo, por favor llame a nuestra lnea principal al [phone] para una actualizacin sobre el estado de cualquier retraso o cierre.  Consejos para la medicacin en dermatologa: Por favor, guarde las cajas en las que vienen los medicamentos de uso tpico para ayudarle a seguir las instrucciones sobre dnde y cmo usarlos. Las farmacias generalmente imprimen las instrucciones del medicamento slo en las cajas y no directamente en los tubos del medicamento.   Si su medicamento es muy caro, por favor, pngase en contacto con   nuestra oficina llamando al [phone] y presione la opcin 4 o envenos un mensaje a travs de MyChart.   No podemos decirle cul ser su copago por los medicamentos por adelantado ya que esto es diferente dependiendo de la cobertura de su seguro. Sin embargo, es posible que podamos encontrar un medicamento sustituto a menor costo o llenar un formulario para que el seguro cubra el medicamento que se considera necesario.   Si se requiere una autorizacin previa para que su compaa de seguros cubra su medicamento, por favor permtanos de 1 a 2 das hbiles para completar este proceso.  Los precios de los medicamentos varan con frecuencia dependiendo del lugar de dnde se surte la receta y alguna farmacias pueden ofrecer precios ms baratos.  El sitio web www.goodrx.com tiene cupones para medicamentos de diferentes farmacias. Los precios aqu no tienen en cuenta lo que podra costar con la ayuda del seguro (puede ser ms barato con su seguro), pero el sitio web puede darle el  precio si no utiliz ningn seguro.  - Puede imprimir el cupn correspondiente y llevarlo con su receta a la farmacia.  - Tambin puede pasar por nuestra oficina durante el horario de atencin regular y recoger una tarjeta de cupones de GoodRx.  - Si necesita que su receta se enve electrnicamente a una farmacia diferente, informe a nuestra oficina a travs de MyChart de [place] o por telfono llamando al [phone] y presione la opcin 4.  

## 2022-12-23 ENCOUNTER — Encounter: Payer: Self-pay | Admitting: Dermatology

## 2022-12-24 ENCOUNTER — Encounter: Payer: 59 | Attending: Gastroenterology | Admitting: Dietician

## 2022-12-24 ENCOUNTER — Encounter: Payer: Self-pay | Admitting: Dietician

## 2022-12-24 VITALS — Ht <= 58 in | Wt 77.5 lb

## 2022-12-24 DIAGNOSIS — Z713 Dietary counseling and surveillance: Secondary | ICD-10-CM | POA: Insufficient documentation

## 2022-12-24 DIAGNOSIS — Z8719 Personal history of other diseases of the digestive system: Secondary | ICD-10-CM | POA: Diagnosis not present

## 2022-12-24 DIAGNOSIS — K581 Irritable bowel syndrome with constipation: Secondary | ICD-10-CM | POA: Diagnosis not present

## 2022-12-24 DIAGNOSIS — K219 Gastro-esophageal reflux disease without esophagitis: Secondary | ICD-10-CM | POA: Insufficient documentation

## 2022-12-24 NOTE — Patient Instructions (Addendum)
Plan to eat something every 4 hours while awake. If supper is early, about 6pm, then have a small snack about 10pm if awake until 1am.  Include a snack if up late at night -- dry cereal like cheerios, unsalted crackers with cheese or peanut butter; small bagel with "light" cream cheese; unsalted pretzels  Include a protein food with a meal or snack 3-4 times a day. Protein helps preserve strength and sometimes helps prevent reflux.  Limit high fodmap foods (red list on diet sheet) -- eat only small amounts not very often to prevent IBS symptoms.

## 2022-12-24 NOTE — Progress Notes (Signed)
Medical Nutrition Therapy: Visit start time: [id number]  end time: 1435  Assessment:   Referral Diagnosis: IBS Other medical history/ diagnoses: GERD Psychosocial issues/ stress concerns: none  Medications, supplements: reconciled list in medical record   Preferred learning method:  Auditory Visual   Current weight: 77.5lbs Height: 4'8" BMI: 17.38 Patient's personal weight goal: maintain  Progress and evaluation:  Reports lifelong issues with constipation; she is taking meds to help, so far has not had complete resolution.  She is trying to drink at least 32oz water daily Reports sensitivity to milk products, some veg including broccoli, cauliflower, and cabbage, fried foods, and [place] coffee She is often awake late into the night, as late as 2:30am, then sleeps until late morning; first meal is often early pm. Food allergies: no allergies Special diet practices: none Patient seeks help with diet for GI health Next PCP appt is 03/2023   Dietary Intake:  Usual eating pattern includes 2 meals and 1-2 snacks per day. Dining out frequency: 2 meals per week. Who plans meals/ buys groceries? self Who prepares meals? self  Breakfast: time varies occ as late as 2pm slimfast drink, fruit; 1/2 pc toast with PB or pim cheese Snack: none Lunch/ Snack: 1/2 pc toast with cheese Snack: none Supper: 6-8pm - takeout; pinto beans from can; salad with Romaine with cucumber, tomato, cheese, croutons Snack: chips, cheetos, cookies Beverages: water 32oz daily  Physical activity: running in place 60 minutes, 6x a week + leg lifts and stretches daily   Intervention:   Nutrition Care Education:   Basic nutrition: basic food groups; appropriate nutrient balance; appropriate meal and snack schedule; general nutrition guidelines    Weight: identifying healthy weight at BMI of at least 20, and potential problems with underweight IBS: high and low fodmap foods; limiting or avoiding only foods that seem  to cause worsening of symptoms; controlling portions of nighttime snacks and making healthy choices; importance of consuming adequate fluids; effects of stress on IBS GERD: controlling nighttime snacking; limiting/ avoiding acidic foods; chewing foods well; eating in relaxed manner; importance of low fat choices and inclusion of protein with meals  Other intervention notes: Patient voices concern of finding clothing to fit her body frame if she gains weight, due to short stature. Established nutrition goals with input from patient. No follow up scheduled at this time; patient will schedule later if needed   Nutritional Diagnosis:  [place]-1.4 Altered GI function As related to IBS, GERD.  As evidenced by patient reported symptoms, reliance on laxatives and stool softeners. [place]-3.1 Underweight As related to small food portions, occasional missed meals or infrequent eating.  As evidenced by patient with BMI of 17.38.   Education Materials given:  Fodmap diet chart GERD nutrition therapy (AND) Plate Planner with food lists, sample meal pattern Visit summary with goals/ instructions   Learner/ who was taught:  Patient   Level of understanding: Verbalizes/ demonstrates competency  Demonstrated degree of understanding via:   Teach back Learning barriers: None  Willingness to learn/ readiness for change: Eager, change in progress   Monitoring and Evaluation:  Dietary intake, exercise, GI symptoms, and body weight      follow up: prn

## 2022-12-26 ENCOUNTER — Telehealth: Payer: Self-pay

## 2022-12-26 NOTE — Telephone Encounter (Signed)
Patient called regarding her Tacrolimus RX and reading all the potential side effects in the pamphlet. She is very concerned using ointment with all the other medications she is currently on and medical issues she has.

## 2022-12-26 NOTE — Telephone Encounter (Signed)
Patient advised of information per Dr. Kowalski. aw 

## 2022-12-27 ENCOUNTER — Telehealth: Payer: Self-pay

## 2022-12-27 NOTE — Telephone Encounter (Signed)
Advised pt of bx results/sh ?

## 2022-12-27 NOTE — Telephone Encounter (Signed)
-----   Message from Ralene Bathe, MD sent at 12/26/2022  6:11 PM EDT ----- Diagnosis Skin , mid line forehead above the glabella STEATOCYSTOMA  Benign steatocystoma = sebaceous cyst No further treatment needed Recheck next visit

## 2023-01-08 ENCOUNTER — Telehealth: Payer: Self-pay | Admitting: Nurse Practitioner

## 2023-01-08 ENCOUNTER — Other Ambulatory Visit: Payer: Self-pay | Admitting: Internal Medicine

## 2023-01-08 DIAGNOSIS — F411 Generalized anxiety disorder: Secondary | ICD-10-CM

## 2023-01-08 MED ORDER — CLONAZEPAM 0.5 MG PO TABS: 0.5000 mg | ORAL_TABLET | Freq: Two times a day (BID) | ORAL | 0 refills | Status: DC | PRN

## 2023-01-08 NOTE — Telephone Encounter (Signed)
Sent!

## 2023-01-16 ENCOUNTER — Other Ambulatory Visit: Payer: Self-pay | Admitting: Nurse Practitioner

## 2023-01-16 ENCOUNTER — Telehealth: Payer: Self-pay

## 2023-01-16 DIAGNOSIS — I10 Essential (primary) hypertension: Secondary | ICD-10-CM

## 2023-01-16 DIAGNOSIS — F411 Generalized anxiety disorder: Secondary | ICD-10-CM

## 2023-01-16 MED ORDER — CLONAZEPAM 0.5 MG PO TABS: 0.5000 mg | ORAL_TABLET | Freq: Two times a day (BID) | ORAL | 0 refills | Status: DC | PRN

## 2023-01-17 NOTE — Telephone Encounter (Signed)
Lmom that we sent her med

## 2023-01-28 ENCOUNTER — Other Ambulatory Visit: Payer: Self-pay

## 2023-01-28 DIAGNOSIS — J438 Other emphysema: Secondary | ICD-10-CM

## 2023-01-28 MED ORDER — UMECLIDINIUM-VILANTEROL 62.5-25 MCG/ACT IN AEPB: 1.0000 | INHALATION_SPRAY | Freq: Every day | RESPIRATORY_TRACT | 5 refills | Status: DC

## 2023-02-06 ENCOUNTER — Other Ambulatory Visit: Payer: Self-pay | Admitting: Nurse Practitioner

## 2023-03-07 ENCOUNTER — Ambulatory Visit (INDEPENDENT_AMBULATORY_CARE_PROVIDER_SITE_OTHER): Payer: 59 | Admitting: Physician Assistant

## 2023-03-07 ENCOUNTER — Encounter: Payer: Self-pay | Admitting: Physician Assistant

## 2023-03-07 ENCOUNTER — Telehealth: Payer: Self-pay

## 2023-03-07 VITALS — BP 145/80 | HR 81 | Temp 97.7°F | Resp 16 | Ht <= 58 in | Wt 77.8 lb

## 2023-03-07 DIAGNOSIS — S00411A Abrasion of right ear, initial encounter: Secondary | ICD-10-CM | POA: Diagnosis not present

## 2023-03-07 NOTE — Progress Notes (Signed)
[facility] [address] [place], [place] [address]  Internal MEDICINE  Office Visit Note  Patient Name: Pam Cruz  [id number]  [id number]  Date of Service: 03/07/2023  Chief Complaint  Patient presents with   Acute Visit   Ear Injury     HPI Pt is here for a sick visit. -Uses cutip on ears everyday and it slipped this morning and went too deep and started bleeding after from Right ear. -Not much ear pain, and not actively bleeding now. Ears itch a lot typically. No hearing changes. -Always hears sloshing in ears as well -On exam, there is a small cut at the opening of the right ear canal that appears to be scabbing already and not actively bleeding. Discussed avoiding use of cutips -Given name of ear wax remover OTC such as debrox to use as needed for ear wax instead of cutips.   Current Medication:  Outpatient Encounter Medications as of 03/07/2023  Medication Sig Note   acetaminophen (TYLENOL) 500 MG tablet Take 500 mg by mouth every 6 (six) hours as needed (pain).     amLODipine (NORVASC) 5 MG tablet Take by mouth.    bimatoprost (LUMIGAN) 0.01 % SOLN INSTILL 1 DROP IN LEFT EYE AT BEDTIME    clonazePAM (KLONOPIN) 0.5 MG tablet Take 1 tablet (0.5 mg total) by mouth 2 (two) times daily as needed for anxiety.    cloNIDine (CATAPRES) 0.1 MG tablet TAKE 1 TABLET BY MOUTH TWICE  DAILY MAY TAKE EXTRA TABLET IF  NEEDED. MAXIMUM 3 TABLETS A DAY.    desipramine (NORPRAMIN) 25 MG tablet Take 1 tablet (25 mg total) by mouth daily.    Homeopathic Products (THERAWORX RELIEF) FOAM Apply 2 application topically See admin instructions. Apply two applications to various areas of the body at bedtime according to bottle instructions 01/29/2020: Medication at bedside    hydrALAZINE (APRESOLINE) 25 MG tablet TAKE 1 TABLET BY MOUTH 3 TIMES  DAILY    levothyroxine (SYNTHROID) 50 MCG tablet Take 1 tablet (50 mcg total) by mouth daily.    losartan (COZAAR) 50 MG tablet TAKE 1 TABLET  BY MOUTH TWICE  DAILY    Multiple Vitamins-Minerals (PRESERVISION AREDS 2+MULTI VIT) CAPS Take 1 capsule by mouth in the morning and at bedtime.    mupirocin ointment (BACTROBAN) 2 % Apply 1 application topically daily. Qd to excision site    omeprazole (PRILOSEC) 20 MG capsule Take 20 mg by mouth 2 (two) times daily before a meal.     Polyethyl Glycol-Propyl Glycol (SYSTANE) 0.4-0.3 % SOLN Apply to eye.    Polyethylene Glycol 400 (BLINK TEARS) 0.25 % SOLN Apply to eye.    Psyllium (METAMUCIL) 28.3 % POWD Take by mouth.    senna-docusate (SENOKOT-S) 8.6-50 MG tablet Take 1 tablet by mouth daily.    tacrolimus (PROTOPIC) 0.1 % ointment Apply to itchy red patches QD PRN.    umeclidinium-vilanterol (ANORO ELLIPTA) 62.5-25 MCG/ACT AEPB Inhale 1 puff into the lungs daily.    WIXELA INHUB 250-50 MCG/ACT AEPB INHALE 1 INHALATION BY  MOUTH INTO THE LUNGS IN THE MORNING AND AT BEDTIME    No facility-administered encounter medications on file as of 03/07/2023.      Medical History: Past Medical History:  Diagnosis Date   Anxiety    Arthritis    COPD (chronic obstructive pulmonary disease) (HCC)    Dyspnea    Gastritis    GERD (gastroesophageal reflux disease)    Hypertension    Osteoporosis  Vital Signs: BP (!) 145/80   Pulse 81   Temp 97.7 F (36.5 C)   Resp 16   Ht 4\' 8"  (1.422 m)   Wt 77 lb 12.8 oz (35.3 kg)   SpO2 97%   BMI 17.44 kg/m    Review of Systems  Constitutional:  Negative for fatigue and fever.  HENT:  Positive for ear discharge. Negative for congestion, ear pain, hearing loss, mouth sores and postnasal drip.   Respiratory:  Negative for cough.   Cardiovascular:  Negative for chest pain.  Genitourinary:  Negative for flank pain.  Psychiatric/Behavioral: Negative.      Physical Exam Vitals reviewed.  Constitutional:      General: She is not in acute distress.    Appearance: Normal appearance. She is underweight. She is not ill-appearing.  HENT:      Head: Normocephalic and atraumatic.     Right Ear: Tympanic membrane normal.     Left Ear: Tympanic membrane and external ear normal.     Ears:     Comments: Small cut at opening of right ear canal, already beginning to heal--no active bleeding, just some dried blood present at entrance of ear canal Eyes:     Pupils: Pupils are equal, round, and reactive to light.  Cardiovascular:     Rate and Rhythm: Normal rate and regular rhythm.  Pulmonary:     Effort: Pulmonary effort is normal. No respiratory distress.  Neurological:     Mental Status: She is alert and oriented to person, place, and time.  Psychiatric:        Mood and Affect: Mood normal.        Behavior: Behavior normal.       Assessment/Plan: 1. Ear canal abrasion, right, initial encounter Already beginning to heal, avoid cutips or touching area. May use topical like debrox in future for management of ear wax rather than cutips   General Counseling: talecia greunke understanding of the findings of todays visit and agrees with plan of treatment. I have discussed any further diagnostic evaluation that may be needed or ordered today. We also reviewed her medications today. she has been encouraged to call the office with any questions or concerns that should arise related to todays visit.    Counseling:    No orders of the defined types were placed in this encounter.   No orders of the defined types were placed in this encounter.   Time spent:25 Minutes

## 2023-03-07 NOTE — Telephone Encounter (Signed)
Pt called that her ear bleeding made her appt today

## 2023-03-12 ENCOUNTER — Telehealth: Payer: Self-pay

## 2023-03-12 DIAGNOSIS — E039 Hypothyroidism, unspecified: Secondary | ICD-10-CM

## 2023-03-12 DIAGNOSIS — E782 Mixed hyperlipidemia: Secondary | ICD-10-CM

## 2023-03-12 DIAGNOSIS — J449 Chronic obstructive pulmonary disease, unspecified: Secondary | ICD-10-CM

## 2023-03-12 DIAGNOSIS — E559 Vitamin D deficiency, unspecified: Secondary | ICD-10-CM

## 2023-03-12 DIAGNOSIS — E871 Hypo-osmolality and hyponatremia: Secondary | ICD-10-CM

## 2023-03-12 DIAGNOSIS — I10 Essential (primary) hypertension: Secondary | ICD-10-CM

## 2023-03-15 ENCOUNTER — Telehealth: Payer: Self-pay

## 2023-03-15 NOTE — Telephone Encounter (Signed)
Lmom lab order already in epic

## 2023-03-15 NOTE — Telephone Encounter (Signed)
Routine labs ordered

## 2023-03-19 ENCOUNTER — Ambulatory Visit: Payer: 59 | Admitting: Nurse Practitioner

## 2023-03-19 ENCOUNTER — Encounter: Payer: Self-pay | Admitting: Nurse Practitioner

## 2023-03-19 VITALS — Resp 16 | Ht <= 58 in

## 2023-03-20 ENCOUNTER — Telehealth: Payer: Self-pay

## 2023-03-20 NOTE — Telephone Encounter (Signed)
Lmom that we faxed letter to Dr baker's office

## 2023-03-22 DIAGNOSIS — M79674 Pain in right toe(s): Secondary | ICD-10-CM | POA: Diagnosis not present

## 2023-03-22 DIAGNOSIS — M79675 Pain in left toe(s): Secondary | ICD-10-CM | POA: Diagnosis not present

## 2023-03-22 DIAGNOSIS — B351 Tinea unguium: Secondary | ICD-10-CM | POA: Diagnosis not present

## 2023-03-22 DIAGNOSIS — L851 Acquired keratosis [keratoderma] palmaris et plantaris: Secondary | ICD-10-CM | POA: Diagnosis not present

## 2023-03-29 NOTE — Progress Notes (Signed)
Left before being seen by provider.  

## 2023-04-02 ENCOUNTER — Ambulatory Visit: Payer: Medicare Other | Admitting: Nurse Practitioner

## 2023-04-11 ENCOUNTER — Ambulatory Visit: Payer: Medicare Other | Admitting: Nurse Practitioner

## 2023-04-17 ENCOUNTER — Ambulatory Visit: Payer: Medicare Other | Admitting: Nurse Practitioner

## 2023-04-22 DIAGNOSIS — E039 Hypothyroidism, unspecified: Secondary | ICD-10-CM | POA: Diagnosis not present

## 2023-04-22 DIAGNOSIS — E871 Hypo-osmolality and hyponatremia: Secondary | ICD-10-CM | POA: Diagnosis not present

## 2023-04-22 DIAGNOSIS — E559 Vitamin D deficiency, unspecified: Secondary | ICD-10-CM | POA: Diagnosis not present

## 2023-04-22 DIAGNOSIS — J449 Chronic obstructive pulmonary disease, unspecified: Secondary | ICD-10-CM | POA: Diagnosis not present

## 2023-04-22 DIAGNOSIS — I10 Essential (primary) hypertension: Secondary | ICD-10-CM | POA: Diagnosis not present

## 2023-04-23 LAB — CBC WITH DIFFERENTIAL/PLATELET
Basophils Absolute: 0 10*3/uL (ref 0.0–0.2)
Basos: 1 %
EOS (ABSOLUTE): 0.1 10*3/uL (ref 0.0–0.4)
Eos: 1 %
Hematocrit: 36.1 % (ref 34.0–46.6)
Hemoglobin: 12.1 g/dL (ref 11.1–15.9)
Immature Grans (Abs): 0 10*3/uL (ref 0.0–0.1)
Immature Granulocytes: 1 %
Lymphocytes Absolute: 0.9 10*3/uL (ref 0.7–3.1)
Lymphs: 25 %
MCH: 30.6 pg (ref 26.6–33.0)
MCHC: 33.5 g/dL (ref 31.5–35.7)
MCV: 91 fL (ref 79–97)
Monocytes Absolute: 0.4 10*3/uL (ref 0.1–0.9)
Monocytes: 10 %
Neutrophils Absolute: 2.3 10*3/uL (ref 1.4–7.0)
Neutrophils: 62 %
Platelets: 290 10*3/uL (ref 150–450)
RBC: 3.96 x10E6/uL (ref 3.77–5.28)
RDW: 12.9 % (ref 11.7–15.4)
WBC: 3.6 10*3/uL (ref 3.4–10.8)

## 2023-04-23 LAB — CMP14+EGFR
ALT: 19 IU/L (ref 0–32)
AST: 29 IU/L (ref 0–40)
Albumin: 4.4 g/dL (ref 3.7–4.7)
Alkaline Phosphatase: 89 IU/L (ref 44–121)
BUN/Creatinine Ratio: 26 (ref 12–28)
BUN: 24 mg/dL (ref 8–27)
Bilirubin Total: 0.4 mg/dL (ref 0.0–1.2)
CO2: 25 mmol/L (ref 20–29)
Calcium: 10.3 mg/dL (ref 8.7–10.3)
Chloride: 102 mmol/L (ref 96–106)
Creatinine, Ser: 0.93 mg/dL (ref 0.57–1.00)
Globulin, Total: 2.3 g/dL (ref 1.5–4.5)
Glucose: 90 mg/dL (ref 70–99)
Potassium: 5 mmol/L (ref 3.5–5.2)
Sodium: 140 mmol/L (ref 134–144)
Total Protein: 6.7 g/dL (ref 6.0–8.5)
eGFR: 60 mL/min/{1.73_m2} (ref >59)

## 2023-04-23 LAB — TSH+FREE T4
Free T4: 1.27 ng/dL (ref 0.82–1.77)
TSH: 4.04 u[IU]/mL (ref 0.450–4.500)

## 2023-04-23 LAB — LIPID PANEL
Chol/HDL Ratio: 2.1 ratio (ref 0.0–4.4)
Cholesterol, Total: 178 mg/dL (ref 100–199)
HDL: 83 mg/dL (ref >39)
LDL Chol Calc (NIH): 87 mg/dL (ref 0–99)
Triglycerides: 39 mg/dL (ref 0–149)
VLDL Cholesterol Cal: 8 mg/dL (ref 5–40)

## 2023-04-23 LAB — VITAMIN D 25 HYDROXY (VIT D DEFICIENCY, FRACTURES): Vit D, 25-Hydroxy: 34.5 ng/mL (ref 30.0–100.0)

## 2023-04-30 ENCOUNTER — Ambulatory Visit: Payer: 59 | Admitting: Nurse Practitioner

## 2023-04-30 ENCOUNTER — Encounter: Payer: Self-pay | Admitting: Nurse Practitioner

## 2023-04-30 VITALS — BP 130/76 | HR 84 | Temp 98.8°F | Resp 16 | Ht <= 58 in | Wt 78.0 lb

## 2023-04-30 DIAGNOSIS — K5909 Other constipation: Secondary | ICD-10-CM | POA: Diagnosis not present

## 2023-04-30 DIAGNOSIS — R300 Dysuria: Secondary | ICD-10-CM | POA: Diagnosis not present

## 2023-04-30 DIAGNOSIS — I10 Essential (primary) hypertension: Secondary | ICD-10-CM | POA: Diagnosis not present

## 2023-04-30 DIAGNOSIS — F411 Generalized anxiety disorder: Secondary | ICD-10-CM | POA: Diagnosis not present

## 2023-04-30 DIAGNOSIS — Z0001 Encounter for general adult medical examination with abnormal findings: Secondary | ICD-10-CM | POA: Diagnosis not present

## 2023-04-30 DIAGNOSIS — Z96641 Presence of right artificial hip joint: Secondary | ICD-10-CM | POA: Diagnosis not present

## 2023-04-30 DIAGNOSIS — J438 Other emphysema: Secondary | ICD-10-CM

## 2023-04-30 MED ORDER — LOSARTAN POTASSIUM 50 MG PO TABS: 50.0000 mg | ORAL_TABLET | Freq: Two times a day (BID) | ORAL | 2 refills | Status: DC

## 2023-04-30 MED ORDER — AMLODIPINE BESYLATE 5 MG PO TABS
5.0000 mg | ORAL_TABLET | Freq: Every day | ORAL | 1 refills | Status: DC
Start: 2023-04-30 — End: 2023-07-08

## 2023-04-30 MED ORDER — UMECLIDINIUM-VILANTEROL 62.5-25 MCG/ACT IN AEPB: 1.0000 | INHALATION_SPRAY | Freq: Every day | RESPIRATORY_TRACT | 5 refills | Status: DC

## 2023-04-30 MED ORDER — CLONAZEPAM 0.5 MG PO TABS
0.7500 mg | ORAL_TABLET | Freq: Every day | ORAL | 0 refills | Status: DC
Start: 2023-04-30 — End: 2023-07-29

## 2023-04-30 NOTE — Addendum Note (Signed)
Addended by: Annamaria Helling on: 04/30/2023 03:48 PM   Modules accepted: Orders

## 2023-04-30 NOTE — Progress Notes (Signed)
[facility] [address] [place], [place] [address]  Internal MEDICINE  Office Visit Note  Patient Name: Pam Cruz  [id number]  [id number]  Date of Service: 04/30/2023  Chief Complaint  Patient presents with   Gastroesophageal Reflux   Hypertension   Medicare Wellness    HPI Pam Cruz presents for an annual well visit and physical exam.  Well-appearing 88 y.o. female with hypertension, COPD, hypothyroidism, and anxiety.  Labs: reviewed labs with patient, all her labs are normal.  New or worsening pain: none IBS-C and GERD -- sees gastro and [facility] -- has appt this afternoon       04/30/2023   11:40 AM 03/27/2022   11:04 AM 03/21/2021   12:11 PM  MMSE - Mini Mental State Exam  Orientation to time 5 5 5   Orientation to Place 5 5 5   Registration 3 3 3   Attention/ Calculation 5 5 5   Recall 3 3 3   Language- name 2 objects 2 2 2   Language- repeat 1 1 1   Language- follow 3 step command 3 3 3   Language- read & follow direction 1 1 1   Write a sentence 1 1 1   Copy design 1 1 1   Total score 30 30 30     Functional Status Survey: Is the patient deaf or have difficulty hearing?: No Does the patient have difficulty seeing, even when wearing glasses/contacts?: Yes Does the patient have difficulty concentrating, remembering, or making decisions?: No Does the patient have difficulty walking or climbing stairs?: No Does the patient have difficulty dressing or bathing?: No Does the patient have difficulty doing errands alone such as visiting a doctor's office or shopping?: No     03/21/2021   12:02 PM 09/19/2021   12:00 PM 03/27/2022   11:02 AM 12/24/2022    1:36 PM 04/30/2023   11:36 AM  Fall Risk  Falls in the past year? 0 0 1 0 0  Was there an injury with Fall?   0  0  Fall Risk Category Calculator   1  0  Fall Risk Category (Retired)   Low    (RETIRED) Patient Fall Risk Level  Low fall risk     Patient at Risk for Falls Due to No Fall Risks No Fall Risks   No Fall  Risks  Fall risk Follow up Falls evaluation completed Falls evaluation completed   Falls evaluation completed       04/30/2023   11:36 AM  Depression screen PHQ 2/9  Decreased Interest 0  Down, Depressed, Hopeless 0  PHQ - 2 Score 0       Current Medication: Outpatient Encounter Medications as of 04/30/2023  Medication Sig Note   acetaminophen (TYLENOL) 500 MG tablet Take 500 mg by mouth every 6 (six) hours as needed (pain).     bimatoprost (LUMIGAN) 0.01 % SOLN INSTILL 1 DROP IN LEFT EYE AT BEDTIME    cloNIDine (CATAPRES) 0.1 MG tablet TAKE 1 TABLET BY MOUTH TWICE  DAILY MAY TAKE EXTRA TABLET IF  NEEDED. MAXIMUM 3 TABLETS A DAY.    desipramine (NORPRAMIN) 25 MG tablet Take 1 tablet (25 mg total) by mouth daily.    hydrALAZINE (APRESOLINE) 25 MG tablet TAKE 1 TABLET BY MOUTH 3 TIMES  DAILY    levothyroxine (SYNTHROID) 50 MCG tablet Take 1 tablet (50 mcg total) by mouth daily.    Multiple Vitamins-Minerals (PRESERVISION AREDS 2+MULTI VIT) CAPS Take 1 capsule by mouth in the morning and at bedtime.  omeprazole (PRILOSEC) 20 MG capsule Take 20 mg by mouth 2 (two) times daily before a meal.     Polyethyl Glycol-Propyl Glycol (SYSTANE) 0.4-0.3 % SOLN Apply to eye.    Polyethylene Glycol 400 (BLINK TEARS) 0.25 % SOLN Apply to eye.    Psyllium (METAMUCIL) 28.3 % POWD Take by mouth.    tacrolimus (PROTOPIC) 0.1 % ointment Apply to itchy red patches QD PRN.    [DISCONTINUED] amLODipine (NORVASC) 5 MG tablet Take by mouth.    [DISCONTINUED] clonazePAM (KLONOPIN) 0.5 MG tablet Take 1 tablet (0.5 mg total) by mouth 2 (two) times daily as needed for anxiety.    [DISCONTINUED] Homeopathic Products (THERAWORX RELIEF) FOAM Apply 2 application topically See admin instructions. Apply two applications to various areas of the body at bedtime according to bottle instructions 01/29/2020: Medication at bedside    [DISCONTINUED] losartan (COZAAR) 50 MG tablet TAKE 1 TABLET BY MOUTH TWICE  DAILY     [DISCONTINUED] mupirocin ointment (BACTROBAN) 2 % Apply 1 application topically daily. Qd to excision site    [DISCONTINUED] senna-docusate (SENOKOT-S) 8.6-50 MG tablet Take 1 tablet by mouth daily.    [DISCONTINUED] umeclidinium-vilanterol (ANORO ELLIPTA) 62.5-25 MCG/ACT AEPB Inhale 1 puff into the lungs daily.    [DISCONTINUED] WIXELA INHUB 250-50 MCG/ACT AEPB INHALE 1 INHALATION BY  MOUTH INTO THE LUNGS IN THE MORNING AND AT BEDTIME    amLODipine (NORVASC) 5 MG tablet Take 1 tablet (5 mg total) by mouth daily.    clonazePAM (KLONOPIN) 0.5 MG tablet Take 1.5 tablets (0.75 mg total) by mouth at bedtime.    losartan (COZAAR) 50 MG tablet Take 1 tablet (50 mg total) by mouth 2 (two) times daily.    umeclidinium-vilanterol (ANORO ELLIPTA) 62.5-25 MCG/ACT AEPB Inhale 1 puff into the lungs daily.    No facility-administered encounter medications on file as of 04/30/2023.    Surgical History: Past Surgical History:  Procedure Laterality Date   COLONOSCOPY     cysto     LAPAROSCOPY     TOTAL HIP ARTHROPLASTY Right 08/17/2019   Procedure: TOTAL HIP ARTHROPLASTY;  Surgeon: Donato Heinz, MD;  Location: [facility] ORS;  Service: Orthopedics;  Laterality: Right;    Medical History: Past Medical History:  Diagnosis Date   Anxiety    Arthritis    COPD (chronic obstructive pulmonary disease) (HCC)    Dyspnea    Gastritis    GERD (gastroesophageal reflux disease)    Hypertension    Osteoporosis     Family History: Family History  Problem Relation Age of Onset   Hypertension Mother    Coronary artery disease Father    Heart disease Father    Breast cancer Neg Hx     Social History   Socioeconomic History   Marital status: Single    Spouse name: Not on file   Number of children: Not on file   Years of education: Not on file   Highest education level: Not on file  Occupational History   Not on file  Tobacco Use   Smoking status: Former    Current packs/day: 0.00    Types: Cigarettes     Quit date: 02/20/2001    Years since quitting: 22.2   Smokeless tobacco: Never   Tobacco comments:    quit 17 years ago  Vaping Use   Vaping status: Never Used  Substance and Sexual Activity   Alcohol use: Not Currently    Alcohol/week: 2.0 standard drinks of alcohol    Types: 2  Glasses of wine per week    Comment: very rarely   Drug use: Never   Sexual activity: Not on file  Other Topics Concern   Not on file  Social History Narrative   Not on file   Social Determinants of Health   Financial Resource Strain: Low Risk  (04/11/2021)   Overall Financial Resource Strain (CARDIA)    Difficulty of Paying Living Expenses: Not very hard  Food Insecurity: Not on file  Transportation Needs: Not on file  Physical Activity: Not on file  Stress: Not on file  Social Connections: Unknown (02/12/2022)   Received from [facility]   Social Network    Social Network: Not on file  Intimate Partner Violence: Unknown (02/09/2022)   Received from [facility]   HITS    Physically Hurt: Not on file    Insult or Talk Down To: Not on file    Threaten Physical Harm: Not on file    Scream or Curse: Not on file      Review of Systems  Constitutional:  Negative for chills, fatigue and unexpected weight change.  HENT:  Positive for postnasal drip. Negative for congestion, rhinorrhea, sneezing and sore throat.   Eyes:  Negative for redness.  Respiratory:  Negative for cough, chest tightness and shortness of breath.   Cardiovascular:  Negative for chest pain and palpitations.  Gastrointestinal:  Negative for abdominal pain, constipation, diarrhea, nausea and vomiting.  Genitourinary:  Negative for dysuria and frequency.  Musculoskeletal:  Negative for arthralgias, back pain, joint swelling and neck pain.  Skin:  Negative for rash.  Neurological: Negative.  Negative for tremors and numbness.  Hematological:  Negative for adenopathy. Does not bruise/bleed easily.  Psychiatric/Behavioral:   Negative for behavioral problems (Depression), sleep disturbance and suicidal ideas. The patient is not nervous/anxious.     Vital Signs: BP 130/76   Pulse 84   Temp 98.8 F (37.1 C)   Resp 16   Ht 4\' 8"  (1.422 m)   Wt 78 lb (35.4 kg)   SpO2 95%   BMI 17.49 kg/m    Physical Exam Vitals reviewed.  Constitutional:      General: She is not in acute distress.    Appearance: Normal appearance. She is not ill-appearing.  HENT:     Head: Normocephalic and atraumatic.     Right Ear: Tympanic membrane, ear canal and external ear normal. There is no impacted cerumen.     Left Ear: Tympanic membrane, ear canal and external ear normal. There is no impacted cerumen.     Nose: Nose normal. No congestion or rhinorrhea.     Mouth/Throat:     Mouth: Mucous membranes are moist.     Pharynx: Oropharynx is clear. No oropharyngeal exudate or posterior oropharyngeal erythema.  Eyes:     Extraocular Movements: Extraocular movements intact.     Conjunctiva/sclera: Conjunctivae normal.     Pupils: Pupils are equal, round, and reactive to light.  Neck:     Vascular: No carotid bruit.  Cardiovascular:     Rate and Rhythm: Normal rate and regular rhythm.     Pulses: Normal pulses.     Heart sounds: Normal heart sounds.  Pulmonary:     Effort: Pulmonary effort is normal.     Breath sounds: Normal breath sounds.  Chest:  Breasts:    Breasts are symmetrical.     Right: Normal. No swelling, bleeding, inverted nipple, mass, nipple discharge, skin change or tenderness.  Left: Normal. No swelling, bleeding, inverted nipple, mass, nipple discharge, skin change or tenderness.  Abdominal:     General: Bowel sounds are normal. There is no distension.     Palpations: Abdomen is soft. There is no mass.     Tenderness: There is no abdominal tenderness. There is no guarding or rebound.     Hernia: No hernia is present.  Musculoskeletal:        General: Normal range of motion.     Cervical back: Normal  range of motion.     Right lower leg: No edema.     Left lower leg: No edema.  Lymphadenopathy:     Cervical: No cervical adenopathy.     Upper Body:     Right upper body: No supraclavicular, axillary or pectoral adenopathy.     Left upper body: No supraclavicular, axillary or pectoral adenopathy.  Skin:    General: Skin is warm and dry.     Capillary Refill: Capillary refill takes less than 2 seconds.     Coloration: Skin is not jaundiced.  Neurological:     General: No focal deficit present.     Mental Status: She is alert and oriented to person, place, and time.     Sensory: No sensory deficit.     Motor: No weakness.     Gait: Gait normal.  Psychiatric:        Mood and Affect: Mood normal.        Behavior: Behavior normal.        Thought Content: Thought content normal.        Judgment: Judgment normal.        Assessment/Plan: 1. Encounter for routine adult health examination with abnormal findings Age-appropriate preventive screenings and vaccinations discussed, annual physical exam completed. Routine labs for health maintenance results discussed with the patient today. PHM updated.   2. Essential hypertension Continue amlodipine and losartan as prescribed.  - amLODipine (NORVASC) 5 MG tablet; Take 1 tablet (5 mg total) by mouth daily.  Dispense: 90 tablet; Refill: 1 - losartan (COZAAR) 50 MG tablet; Take 1 tablet (50 mg total) by mouth 2 (two) times daily.  Dispense: 200 tablet; Refill: 2  3. Other emphysema (HCC) Continue anoro ellipta as prescribed - umeclidinium-vilanterol (ANORO ELLIPTA) 62.5-25 MCG/ACT AEPB; Inhale 1 puff into the lungs daily.  Dispense: 60 each; Refill: 5  4. GAD (generalized anxiety disorder) Continue clonazepam as prescribed, follow up in 3 months for additional refills.  - clonazePAM (KLONOPIN) 0.5 MG tablet; Take 1.5 tablets (0.75 mg total) by mouth at bedtime.  Dispense: 135 tablet; Refill: 0      General Counseling: Pam Cruz  understanding of the findings of todays visit and agrees with plan of treatment. I have discussed any further diagnostic evaluation that may be needed or ordered today. We also reviewed her medications today. she has been encouraged to call the office with any questions or concerns that should arise related to todays visit.    No orders of the defined types were placed in this encounter.   Meds ordered this encounter  Medications   clonazePAM (KLONOPIN) 0.5 MG tablet    Sig: Take 1.5 tablets (0.75 mg total) by mouth at bedtime.    Dispense:  135 tablet    Refill:  0   amLODipine (NORVASC) 5 MG tablet    Sig: Take 1 tablet (5 mg total) by mouth daily.    Dispense:  90 tablet    Refill:  1  losartan (COZAAR) 50 MG tablet    Sig: Take 1 tablet (50 mg total) by mouth 2 (two) times daily.    Dispense:  200 tablet    Refill:  2    Please send a replace/new response with 100-Day Supply if appropriate to maximize member benefit. Requesting 1 year supply.   umeclidinium-vilanterol (ANORO ELLIPTA) 62.5-25 MCG/ACT AEPB    Sig: Inhale 1 puff into the lungs daily.    Dispense:  60 each    Refill:  5    Discontinue wixela, send new medication asap.    Return in about 3 months (around 07/24/2023) for F/U, anxiety med refill, Pam Cruz PCP.   Total time spent:30 Minutes Time spent includes review of chart, medications, test results, and follow up plan with the patient.   [place] Controlled Substance Database was reviewed by me.  This patient was seen by Sallyanne Kuster, FNP-C in collaboration with Dr. Beverely Risen as a part of collaborative care agreement.  Trenika Hudson R. Tedd Sias, MSN, FNP-C Internal medicine

## 2023-05-01 LAB — UA/M W/RFLX CULTURE, ROUTINE
Bilirubin, UA: NEGATIVE
Glucose, UA: NEGATIVE
Ketones, UA: NEGATIVE
Leukocytes,UA: NEGATIVE
Nitrite, UA: NEGATIVE
Protein,UA: NEGATIVE
RBC, UA: NEGATIVE
Specific Gravity, UA: 1.015 (ref 1.005–1.030)
Urobilinogen, Ur: 0.2 mg/dL (ref 0.2–1.0)
pH, UA: 5.5 (ref 5.0–7.5)

## 2023-05-01 LAB — MICROSCOPIC EXAMINATION
Bacteria, UA: NONE SEEN
Casts: NONE SEEN /lpf
RBC, Urine: NONE SEEN /hpf (ref 0–2)
WBC, UA: NONE SEEN /hpf (ref 0–5)

## 2023-05-17 DIAGNOSIS — H40053 Ocular hypertension, bilateral: Secondary | ICD-10-CM | POA: Diagnosis not present

## 2023-05-17 DIAGNOSIS — H35051 Retinal neovascularization, unspecified, right eye: Secondary | ICD-10-CM | POA: Diagnosis not present

## 2023-05-17 DIAGNOSIS — Z961 Presence of intraocular lens: Secondary | ICD-10-CM | POA: Diagnosis not present

## 2023-05-17 DIAGNOSIS — H43813 Vitreous degeneration, bilateral: Secondary | ICD-10-CM | POA: Diagnosis not present

## 2023-05-28 DIAGNOSIS — Z961 Presence of intraocular lens: Secondary | ICD-10-CM | POA: Diagnosis not present

## 2023-05-28 DIAGNOSIS — H40053 Ocular hypertension, bilateral: Secondary | ICD-10-CM | POA: Diagnosis not present

## 2023-05-28 DIAGNOSIS — H353211 Exudative age-related macular degeneration, right eye, with active choroidal neovascularization: Secondary | ICD-10-CM | POA: Diagnosis not present

## 2023-05-28 DIAGNOSIS — H35051 Retinal neovascularization, unspecified, right eye: Secondary | ICD-10-CM | POA: Diagnosis not present

## 2023-05-29 ENCOUNTER — Telehealth: Payer: Self-pay

## 2023-05-29 NOTE — Telephone Encounter (Signed)
Pt called that she said diarrhea and throws up last due to she had dairy queen ice cream and she stop throwing up last night and as per alyssa advised to do bland diet and also call her GI dr and if need [place] to do nausea med call us back

## 2023-06-09 ENCOUNTER — Other Ambulatory Visit: Payer: Self-pay | Admitting: Nurse Practitioner

## 2023-06-20 DIAGNOSIS — H35051 Retinal neovascularization, unspecified, right eye: Secondary | ICD-10-CM | POA: Diagnosis not present

## 2023-06-20 DIAGNOSIS — H35372 Puckering of macula, left eye: Secondary | ICD-10-CM | POA: Diagnosis not present

## 2023-06-20 DIAGNOSIS — Z961 Presence of intraocular lens: Secondary | ICD-10-CM | POA: Diagnosis not present

## 2023-07-08 ENCOUNTER — Other Ambulatory Visit: Payer: Self-pay | Admitting: Nurse Practitioner

## 2023-07-08 DIAGNOSIS — I10 Essential (primary) hypertension: Secondary | ICD-10-CM

## 2023-07-24 ENCOUNTER — Telehealth: Payer: Self-pay

## 2023-07-24 NOTE — Telephone Encounter (Signed)
Patient called because she is losing her Medicaid at the end of this month. Patient is concerned with any bills or fees that would apply for her December appointment. Patient advised we can not promise anything but she should contact the billing dept to inquire about financial assistance or hardship assistance. Patient provided with their phone number. aw

## 2023-07-26 DIAGNOSIS — L851 Acquired keratosis [keratoderma] palmaris et plantaris: Secondary | ICD-10-CM | POA: Diagnosis not present

## 2023-07-26 DIAGNOSIS — M79675 Pain in left toe(s): Secondary | ICD-10-CM | POA: Diagnosis not present

## 2023-07-26 DIAGNOSIS — B351 Tinea unguium: Secondary | ICD-10-CM | POA: Diagnosis not present

## 2023-07-26 DIAGNOSIS — M79674 Pain in right toe(s): Secondary | ICD-10-CM | POA: Diagnosis not present

## 2023-07-29 ENCOUNTER — Ambulatory Visit: Payer: 59 | Admitting: Nurse Practitioner

## 2023-07-29 ENCOUNTER — Encounter: Payer: Self-pay | Admitting: Nurse Practitioner

## 2023-07-29 ENCOUNTER — Telehealth: Payer: Self-pay | Admitting: Nurse Practitioner

## 2023-07-29 VITALS — BP 138/84 | HR 71 | Temp 98.3°F | Resp 16 | Ht <= 58 in | Wt 77.2 lb

## 2023-07-29 DIAGNOSIS — F411 Generalized anxiety disorder: Secondary | ICD-10-CM

## 2023-07-29 DIAGNOSIS — Z23 Encounter for immunization: Secondary | ICD-10-CM

## 2023-07-29 DIAGNOSIS — K219 Gastro-esophageal reflux disease without esophagitis: Secondary | ICD-10-CM | POA: Diagnosis not present

## 2023-07-29 MED ORDER — CLONAZEPAM 0.5 MG PO TABS
0.7500 mg | ORAL_TABLET | Freq: Every day | ORAL | 0 refills | Status: DC
Start: 2023-07-29 — End: 2023-10-29

## 2023-07-29 NOTE — Telephone Encounter (Signed)
Lvm to schedule today's 3 month follow up-Toni

## 2023-07-29 NOTE — Progress Notes (Signed)
[facility] [address] [place], [place] [address]  Internal MEDICINE  Office Visit Note  Patient Name: Pam Cruz  [id number]  [id number]  Date of Service: 07/29/2023  Chief Complaint  Patient presents with   Gastroesophageal Reflux   Hypertension   Follow-up    HPI Pam Cruz presents for a follow-up visit for GERD, anxiety, and flu vaccine. GERD -- takes omeprazole, but has been eating foods that makes her symptoms worse. Anxiety -- takes clonazepam as needed. No issues Flu vaccine due     Current Medication: Outpatient Encounter Medications as of 07/29/2023  Medication Sig   acetaminophen (TYLENOL) 500 MG tablet Take 500 mg by mouth every 6 (six) hours as needed (pain).    amLODipine (NORVASC) 5 MG tablet TAKE 1 TABLET BY MOUTH DAILY   bimatoprost (LUMIGAN) 0.01 % SOLN INSTILL 1 DROP IN LEFT EYE AT BEDTIME   cloNIDine (CATAPRES) 0.1 MG tablet TAKE 1 TABLET BY MOUTH TWICE  DAILY MAY TAKE EXTRA TABLET IF  NEEDED. MAXIMUM 3 TABLETS A DAY.   desipramine (NORPRAMIN) 25 MG tablet TAKE 1 TABLET BY MOUTH DAILY   hydrALAZINE (APRESOLINE) 25 MG tablet TAKE 1 TABLET BY MOUTH 3 TIMES  DAILY   levothyroxine (SYNTHROID) 50 MCG tablet Take 1 tablet (50 mcg total) by mouth daily.   losartan (COZAAR) 50 MG tablet Take 1 tablet (50 mg total) by mouth 2 (two) times daily.   Multiple Vitamins-Minerals (PRESERVISION AREDS 2+MULTI VIT) CAPS Take 1 capsule by mouth in the morning and at bedtime.   omeprazole (PRILOSEC) 20 MG capsule Take 20 mg by mouth 2 (two) times daily before a meal.    Polyethyl Glycol-Propyl Glycol (SYSTANE) 0.4-0.3 % SOLN Apply to eye.   Polyethylene Glycol 400 (BLINK TEARS) 0.25 % SOLN Apply to eye.   Psyllium (METAMUCIL) 28.3 % POWD Take by mouth.   tacrolimus (PROTOPIC) 0.1 % ointment Apply to itchy red patches QD PRN.   umeclidinium-vilanterol (ANORO ELLIPTA) 62.5-25 MCG/ACT AEPB Inhale 1 puff into the lungs daily.   [DISCONTINUED] clonazePAM  (KLONOPIN) 0.5 MG tablet Take 1.5 tablets (0.75 mg total) by mouth at bedtime.   clonazePAM (KLONOPIN) 0.5 MG tablet Take 1.5 tablets (0.75 mg total) by mouth at bedtime.   No facility-administered encounter medications on file as of 07/29/2023.    Surgical History: Past Surgical History:  Procedure Laterality Date   COLONOSCOPY     cysto     LAPAROSCOPY     TOTAL HIP ARTHROPLASTY Right 08/17/2019   Procedure: TOTAL HIP ARTHROPLASTY;  Surgeon: Donato Heinz, MD;  Location: [facility] ORS;  Service: Orthopedics;  Laterality: Right;    Medical History: Past Medical History:  Diagnosis Date   Anxiety    Arthritis    COPD (chronic obstructive pulmonary disease) (HCC)    Dyspnea    Gastritis    GERD (gastroesophageal reflux disease)    Hypertension    Osteoporosis     Family History: Family History  Problem Relation Age of Onset   Hypertension Mother    Coronary artery disease Father    Heart disease Father    Breast cancer Neg Hx     Social History   Socioeconomic History   Marital status: Single    Spouse name: Not on file   Number of children: Not on file   Years of education: Not on file   Highest education level: Not on file  Occupational History   Not on file  Tobacco Use   Smoking  status: Former    Current packs/day: 0.00    Types: Cigarettes    Quit date: 02/20/2001    Years since quitting: 22.4   Smokeless tobacco: Never   Tobacco comments:    quit 17 years ago  Vaping Use   Vaping status: Never Used  Substance and Sexual Activity   Alcohol use: Not Currently    Alcohol/week: 2.0 standard drinks of alcohol    Types: 2 Glasses of wine per week    Comment: very rarely   Drug use: Never   Sexual activity: Not on file  Other Topics Concern   Not on file  Social History Narrative   Not on file   Social Determinants of Health   Financial Resource Strain: Low Risk  (04/11/2021)   Overall Financial Resource Strain (CARDIA)    Difficulty of Paying Living  Expenses: Not very hard  Food Insecurity: Not on file  Transportation Needs: Not on file  Physical Activity: Not on file  Stress: Not on file  Social Connections: Unknown (02/12/2022)   Received from [facility]   Social Network    Social Network: Not on file  Intimate Partner Violence: Unknown (02/09/2022)   Received from [facility]   HITS    Physically Hurt: Not on file    Insult or Talk Down To: Not on file    Threaten Physical Harm: Not on file    Scream or Curse: Not on file      Review of Systems  Constitutional:  Negative for chills, fatigue and unexpected weight change.  HENT:  Negative for congestion, postnasal drip, rhinorrhea, sneezing and sore throat.   Eyes:  Negative for redness.  Respiratory:  Negative for cough, chest tightness, shortness of breath and wheezing.   Cardiovascular: Negative.  Negative for chest pain and palpitations.  Gastrointestinal:  Positive for constipation. Negative for abdominal pain, diarrhea, nausea and vomiting.  Genitourinary:  Negative for dysuria and frequency.  Musculoskeletal:  Negative for arthralgias, back pain, joint swelling and neck pain.  Skin:  Negative for rash.  Neurological: Negative.  Negative for tremors and numbness.  Hematological:  Negative for adenopathy. Does not bruise/bleed easily.  Psychiatric/Behavioral:  Negative for behavioral problems (Depression), self-injury, sleep disturbance and suicidal ideas. The patient is nervous/anxious.     Vital Signs: BP 138/84   Pulse 71   Temp 98.3 F (36.8 C)   Resp 16   Ht 4\' 8"  (1.422 m)   Wt 77 lb 3.2 oz (35 kg)   SpO2 95%   BMI 17.31 kg/m    Physical Exam Vitals reviewed.  Constitutional:      General: She is not in acute distress.    Appearance: Normal appearance. She is underweight. She is not ill-appearing.  HENT:     Head: Normocephalic and atraumatic.  Eyes:     Pupils: Pupils are equal, round, and reactive to light.  Cardiovascular:     Rate and  Rhythm: Normal rate and regular rhythm.  Pulmonary:     Effort: Pulmonary effort is normal. No respiratory distress.  Neurological:     Mental Status: She is alert and oriented to person, place, and time.  Psychiatric:        Mood and Affect: Mood normal.        Behavior: Behavior normal.        Assessment/Plan: 1. Gastroesophageal reflux disease without esophagitis Continue omeprazole as prescribed. Avoid foods that trigger symptoms.   2. Need for vaccination Flu vaccine administered  in office today  - Influenza, MDCK, trivalent, PF(Flucelvax egg-free)  3. GAD (generalized anxiety disorder) Continue clonazepam as prescribed. Follow up in 3 months for additional refills. UDS due at next office visit - clonazePAM (KLONOPIN) 0.5 MG tablet; Take 1.5 tablets (0.75 mg total) by mouth at bedtime.  Dispense: 135 tablet; Refill: 0   General Counseling: Pam Cruz understanding of the findings of todays visit and agrees with plan of treatment. I have discussed any further diagnostic evaluation that may be needed or ordered today. We also reviewed her medications today. she has been encouraged to call the office with any questions or concerns that should arise related to todays visit.    Orders Placed This Encounter  Procedures   Influenza, MDCK, trivalent, PF(Flucelvax egg-free)    Meds ordered this encounter  Medications   clonazePAM (KLONOPIN) 0.5 MG tablet    Sig: Take 1.5 tablets (0.75 mg total) by mouth at bedtime.    Dispense:  135 tablet    Refill:  0    Return in about 3 months (around 10/22/2023) for F/U, anxiety med refill, Josejuan Hoaglin PCP.   Total time spent:30 Minutes Time spent includes review of chart, medications, test results, and follow up plan with the patient.   [place] Controlled Substance Database was reviewed by me.  This patient was seen by Sallyanne Kuster, FNP-C in collaboration with Dr. Beverely Risen as a part of collaborative care agreement.   Gaynor Genco R.  Tedd Sias, MSN, FNP-C Internal medicine

## 2023-08-01 DIAGNOSIS — Z96641 Presence of right artificial hip joint: Secondary | ICD-10-CM | POA: Diagnosis not present

## 2023-08-08 DIAGNOSIS — H353211 Exudative age-related macular degeneration, right eye, with active choroidal neovascularization: Secondary | ICD-10-CM | POA: Diagnosis not present

## 2023-09-13 ENCOUNTER — Encounter: Payer: Self-pay | Admitting: Nurse Practitioner

## 2023-09-13 DIAGNOSIS — F411 Generalized anxiety disorder: Secondary | ICD-10-CM | POA: Insufficient documentation

## 2023-09-19 ENCOUNTER — Encounter: Payer: Self-pay | Admitting: Dermatology

## 2023-09-19 ENCOUNTER — Ambulatory Visit: Payer: 59 | Admitting: Dermatology

## 2023-09-19 DIAGNOSIS — L209 Atopic dermatitis, unspecified: Secondary | ICD-10-CM

## 2023-09-19 DIAGNOSIS — D692 Other nonthrombocytopenic purpura: Secondary | ICD-10-CM | POA: Diagnosis not present

## 2023-09-19 DIAGNOSIS — Z7189 Other specified counseling: Secondary | ICD-10-CM

## 2023-09-19 DIAGNOSIS — Z1283 Encounter for screening for malignant neoplasm of skin: Secondary | ICD-10-CM | POA: Diagnosis not present

## 2023-09-19 DIAGNOSIS — Z79899 Other long term (current) drug therapy: Secondary | ICD-10-CM

## 2023-09-19 DIAGNOSIS — D229 Melanocytic nevi, unspecified: Secondary | ICD-10-CM

## 2023-09-19 DIAGNOSIS — L578 Other skin changes due to chronic exposure to nonionizing radiation: Secondary | ICD-10-CM

## 2023-09-19 DIAGNOSIS — W908XXA Exposure to other nonionizing radiation, initial encounter: Secondary | ICD-10-CM

## 2023-09-19 DIAGNOSIS — Z808 Family history of malignant neoplasm of other organs or systems: Secondary | ICD-10-CM | POA: Diagnosis not present

## 2023-09-19 DIAGNOSIS — D1801 Hemangioma of skin and subcutaneous tissue: Secondary | ICD-10-CM | POA: Diagnosis not present

## 2023-09-19 DIAGNOSIS — L821 Other seborrheic keratosis: Secondary | ICD-10-CM

## 2023-09-19 DIAGNOSIS — L814 Other melanin hyperpigmentation: Secondary | ICD-10-CM | POA: Diagnosis not present

## 2023-09-19 MED ORDER — TRIAMCINOLONE ACETONIDE 0.1 % EX CREA
TOPICAL_CREAM | CUTANEOUS | 3 refills | Status: AC
Start: 2023-09-19 — End: ?

## 2023-09-19 NOTE — Progress Notes (Signed)
Follow-Up Visit   Subjective  Pam Cruz is a 88 y.o. female who presents for the following: Skin Cancer Screening and Full Body Skin Exam  The patient presents for Total-Body Skin Exam (TBSE) for skin cancer screening and mole check. The patient has spots, moles and lesions to be evaluated, some may be new or changing and the patient may have concern these could be cancer.  Patient was seen in March for Atopic Dermatitis and has been using tacrolimus which does not seem to be helping with the redness.   The following portions of the chart were reviewed this encounter and updated as appropriate: medications, allergies, medical history  Review of Systems:  No other skin or systemic complaints except as noted in HPI or Assessment and Plan.  Objective  Well appearing patient in no apparent distress; mood and affect are within normal limits.  A full examination was performed including scalp, head, eyes, ears, nose, lips, neck, chest, axillae, abdomen, back, buttocks, bilateral upper extremities, bilateral lower extremities, hands, feet, fingers, toes, fingernails, and toenails. All findings within normal limits unless otherwise noted below.   Relevant physical exam findings are noted in the Assessment and Plan.    Assessment & Plan   SKIN CANCER SCREENING PERFORMED TODAY.  ACTINIC DAMAGE - Chronic condition, secondary to cumulative UV/sun exposure - diffuse scaly erythematous macules with underlying dyspigmentation - Recommend daily broad spectrum sunscreen SPF 30+ to sun-exposed areas, reapply every 2 hours as needed.  - Staying in the shade or wearing long sleeves, sun glasses (UVA+UVB protection) and wide brim hats (4-inch brim around the entire circumference of the hat) are also recommended for sun protection.  - Call for new or changing lesions.  LENTIGINES, SEBORRHEIC KERATOSES, HEMANGIOMAS - Benign normal skin lesions - Benign-appearing - Call for any  changes  MELANOCYTIC NEVI - Tan-brown and/or pink-flesh-colored symmetric macules and papules - Benign appearing on exam today - Observation - Call clinic for new or changing moles - Recommend daily use of broad spectrum spf 30+ sunscreen to sun-exposed areas.   FAMILY HISTORY OF SKIN CANCER What type(s): melanoma Who affected: brother, maternal uncle  ATOPIC DERMATITIS Exam: Scaly pink papules coalescing to plaques at legs Chronic and persistent condition with duration or expected duration over one year. Condition is symptomatic/ bothersome to patient. Not currently at goal. Atopic dermatitis (eczema) is a chronic, relapsing, pruritic condition that can significantly affect quality of life. It is often associated with allergic rhinitis and/or asthma and can require treatment with topical medications, phototherapy, or in severe cases biologic injectable medication (Dupixent; Adbry) or Oral JAK inhibitors.  Treatment Plan: Start TMC 0.1% cream once a day on Friday, Saturday and Sundays. Avoid applying to face, groin, and axilla. Use as directed. Long-term use can cause thinning of the skin. Continue tacrolimus 0.1% ointment every day x 7 days a week.   Recommend gentle skin care.  Topical steroids (such as triamcinolone, fluocinolone, fluocinonide, mometasone, clobetasol, halobetasol, betamethasone, hydrocortisone) can cause thinning and lightening of the skin if they are used for too long in the same area. Your physician has selected the right strength medicine for your problem and area affected on the body. Please use your medication only as directed by your physician to prevent side effects.   Purpura - Chronic; persistent and recurrent.  Treatable, but not curable. - Violaceous macules and patches - Benign - Related to trauma, age, sun damage and/or use of blood thinners, chronic use of topical and/or oral steroids -  Observe - Can use OTC arnica containing moisturizer such as  Dermend Bruise Formula if desired - Call for worsening or other concerns  Atopic dermatitis, unspecified type  Related Medications tacrolimus (PROTOPIC) 0.1 % ointment Apply to itchy red patches QD PRN.  triamcinolone cream (KENALOG) 0.1 % Apply once a day to aa legs on Friday, Saturday and Sundays. Avoid applying to face, groin, and axilla. Use as directed. Long-term use can cause thinning of the skin.   Return in about 1 year (around 09/18/2024) for TBSE, with Dr. Beverlyn Roux, RMA, am acting as scribe for Armida Sans, MD .   Documentation: I have reviewed the above documentation for accuracy and completeness, and I agree with the above.  Armida Sans, MD

## 2023-09-19 NOTE — Patient Instructions (Addendum)
Treatment Plan: Start triamcinolone 0.1% cream once a day on Friday, Saturday and Sundays. Avoid applying to face, groin, and axilla. Use as directed. Long-term use can cause thinning of the skin. Continue tacrolimus 0.1% ointment every day x 7 days a week.   Recommend gentle skin care.  Topical steroids (such as triamcinolone, fluocinolone, fluocinonide, mometasone, clobetasol, halobetasol, betamethasone, hydrocortisone) can cause thinning and lightening of the skin if they are used for too long in the same area. Your physician has selected the right strength medicine for your problem and area affected on the body. Please use your medication only as directed by your physician to prevent side effects.   Melanoma ABCDEs  Melanoma is the most dangerous type of skin cancer, and is the leading cause of death from skin disease.  You are more likely to develop melanoma if you: Have light-colored skin, light-colored eyes, or red or blond hair Spend a lot of time in the sun Tan regularly, either outdoors or in a tanning bed Have had blistering sunburns, especially during childhood Have a close family member who has had a melanoma Have atypical moles or large birthmarks  Early detection of melanoma is key since treatment is typically straightforward and cure rates are extremely high if we catch it early.   The first sign of melanoma is often a change in a mole or a new dark spot.  The ABCDE system is a way of remembering the signs of melanoma.  A for asymmetry:  The two halves do not match. B for border:  The edges of the growth are irregular. C for color:  A mixture of colors are present instead of an even brown color. D for diameter:  Melanomas are usually (but not always) greater than 6mm - the size of a pencil eraser. E for evolution:  The spot keeps changing in size, shape, and color.  Please check your skin once per month between visits. You can use a small mirror in front and a large mirror  behind you to keep an eye on the back side or your body.   If you see any new or changing lesions before your next follow-up, please call to schedule a visit.  Please continue daily skin protection including broad spectrum sunscreen SPF 30+ to sun-exposed areas, reapplying every 2 hours as needed when you're outdoors.    Due to recent changes in healthcare laws, you may see results of your pathology and/or laboratory studies on MyChart before the doctors have had a chance to review them. We understand that in some cases there may be results that are confusing or concerning to you. Please understand that not all results are received at the same time and often the doctors may need to interpret multiple results in order to provide you with the best plan of care or course of treatment. Therefore, we ask that you please give [place] 2 business days to thoroughly review all your results before contacting the office for clarification. Should we see a critical lab result, you will be contacted sooner.   If You Need Anything After Your Visit  If you have any questions or concerns for your doctor, please call our main line at [phone] and press option 4 to reach your doctor's medical assistant. If no one answers, please leave a voicemail as directed and we will return your call as soon as possible. Messages left after 4 pm will be answered the following business day.   You may also send [place] a  message via MyChart. We typically respond to MyChart messages within 1-2 business days.  For prescription refills, please ask your pharmacy to contact our office. Our fax number is [phone].  If you have an urgent issue when the clinic is closed that cannot wait until the next business day, you can page your doctor at the number below.    Please note that while we do our best to be available for urgent issues outside of office hours, we are not available 24/7.   If you have an urgent issue and are unable to reach  [place], you may choose to seek medical care at your doctor's office, retail clinic, urgent care center, or emergency room.  If you have a medical emergency, please immediately call 911 or go to the emergency department.  Pager Numbers  - Dr. Gwen Pounds: [phone]  - Dr. Roseanne Reno: [phone]  - Dr. Katrinka Blazing: [phone]   In the event of inclement weather, please call our main line at [phone] for an update on the status of any delays or closures.  Dermatology Medication Tips: Please keep the boxes that topical medications come in in order to help keep track of the instructions about where and how to use these. Pharmacies typically print the medication instructions only on the boxes and not directly on the medication tubes.   If your medication is too expensive, please contact our office at [phone] option 4 or send [place] a message through MyChart.   We are unable to tell what your co-pay for medications will be in advance as this is different depending on your insurance coverage. However, we may be able to find a substitute medication at lower cost or fill out paperwork to get insurance to cover a needed medication.   If a prior authorization is required to get your medication covered by your insurance company, please allow [place] 1-2 business days to complete this process.  Drug prices often vary depending on where the prescription is filled and some pharmacies may offer cheaper prices.  The website www.goodrx.com contains coupons for medications through different pharmacies. The prices here do not account for what the cost may be with help from insurance (it may be cheaper with your insurance), but the website can give you the price if you did not use any insurance.  - You can print the associated coupon and take it with your prescription to the pharmacy.  - You may also stop by our office during regular business hours and pick up a GoodRx coupon card.  - If you need your prescription sent  electronically to a different pharmacy, notify our office through [facility] or by phone at [phone] option 4.     Si Usted Necesita Algo Despus de Su Visita  Tambin puede enviarnos un mensaje a travs de Clinical cytogeneticist. Por lo general respondemos a los mensajes de MyChart en el transcurso de 1 a 2 das hbiles.  Para renovar recetas, por favor pida a su farmacia que se ponga en contacto con nuestra oficina. Annie Sable de fax es [place] [phone].  Si tiene un asunto urgente cuando la clnica est cerrada y que no puede esperar hasta el siguiente da hbil, puede llamar/localizar a su doctor(a) al nmero que aparece a continuacin.   Por favor, tenga en cuenta que aunque hacemos todo lo posible para estar disponibles para asuntos urgentes fuera del horario de [place], no estamos disponibles las 24 horas del da, los 7 [address]  [address] de la [place].   Si tiene un problema  urgente y no puede comunicarse con nosotros, puede optar por buscar atencin mdica  en el consultorio de su doctor(a), en una clnica privada, en un centro de atencin urgente o en una sala de emergencias.  Si tiene Engineer, drilling, por favor llame inmediatamente al 911 o vaya a la sala de emergencias.  Nmeros de bper  - Dr. Gwen Pounds: [phone]  - Dra. Roseanne Reno: [phone]  - Dr. Katrinka Blazing: [phone]   En caso de inclemencias del tiempo, por favor llame a Lacy Duverney principal al [phone] para una actualizacin sobre el [place] de cualquier retraso o cierre.  Consejos para la medicacin en dermatologa: Por favor, guarde las cajas en las que vienen los medicamentos de uso tpico para ayudarle a seguir las instrucciones sobre dnde y cmo usarlos. Las farmacias generalmente imprimen las instrucciones del medicamento slo en las cajas y no directamente en los tubos del [place].   Si su medicamento es muy caro, por favor, pngase en contacto con Rolm [place] llamando al [phone] y presione la  opcin 4 o envenos un mensaje a travs de Clinical cytogeneticist.   No podemos decirle cul ser su copago por los medicamentos por adelantado ya que esto es diferente dependiendo de la cobertura de su seguro. Sin embargo, es posible que podamos encontrar un medicamento sustituto a Audiological scientist un formulario para que el seguro cubra el medicamento que se considera necesario.   Si se requiere una autorizacin previa para que su compaa de seguros [place] su medicamento, por favor permtanos de 1 a 2 das hbiles para completar [address]  Los precios de los medicamentos varan con frecuencia dependiendo del Environmental consultant de dnde se surte la receta y alguna farmacias pueden ofrecer precios ms baratos.  El sitio web www.goodrx.com tiene cupones para medicamentos de Health and safety inspector. Los precios aqu no tienen en cuenta lo que podra costar con la ayuda del seguro (puede ser ms barato con su seguro), pero el sitio web puede darle el precio si no utiliz Tourist information centre manager.  - Puede imprimir el cupn correspondiente y llevarlo con su receta a la farmacia.  - Tambin puede pasar por nuestra oficina durante el horario de atencin regular y Education officer, museum una tarjeta de cupones de GoodRx.  - Si necesita que su receta se enve electrnicamente a una farmacia diferente, informe a nuestra oficina a travs de MyChart de [place] o por telfono llamando al [phone] y presione la opcin 4.

## 2023-09-23 ENCOUNTER — Other Ambulatory Visit: Payer: Self-pay | Admitting: Nurse Practitioner

## 2023-09-23 DIAGNOSIS — J438 Other emphysema: Secondary | ICD-10-CM

## 2023-09-26 DIAGNOSIS — H35372 Puckering of macula, left eye: Secondary | ICD-10-CM | POA: Diagnosis not present

## 2023-09-26 DIAGNOSIS — H353211 Exudative age-related macular degeneration, right eye, with active choroidal neovascularization: Secondary | ICD-10-CM | POA: Diagnosis not present

## 2023-09-30 DIAGNOSIS — Z961 Presence of intraocular lens: Secondary | ICD-10-CM | POA: Diagnosis not present

## 2023-09-30 DIAGNOSIS — H40053 Ocular hypertension, bilateral: Secondary | ICD-10-CM | POA: Diagnosis not present

## 2023-10-21 ENCOUNTER — Other Ambulatory Visit: Payer: Self-pay

## 2023-10-21 MED ORDER — DESIPRAMINE HCL 25 MG PO TABS: 25.0000 mg | ORAL_TABLET | Freq: Every day | ORAL | 0 refills | Status: DC

## 2023-10-22 ENCOUNTER — Ambulatory Visit: Payer: 59 | Admitting: Nurse Practitioner

## 2023-10-29 ENCOUNTER — Ambulatory Visit
Admission: RE | Admit: 2023-10-29 | Discharge: 2023-10-29 | Disposition: A | Discharge status: Home / Self Care | Payer: 59 | Attending: Nurse Practitioner | Admitting: Nurse Practitioner

## 2023-10-29 ENCOUNTER — Ambulatory Visit (INDEPENDENT_AMBULATORY_CARE_PROVIDER_SITE_OTHER): Payer: 59 | Admitting: Nurse Practitioner

## 2023-10-29 ENCOUNTER — Encounter: Payer: Self-pay | Admitting: Nurse Practitioner

## 2023-10-29 ENCOUNTER — Ambulatory Visit
Admission: RE | Admit: 2023-10-29 | Discharge: 2023-10-29 | Disposition: A | Discharge status: Home / Self Care | Payer: 59 | Source: Ambulatory Visit | Attending: Nurse Practitioner

## 2023-10-29 VITALS — BP 120/78 | HR 74 | Temp 98.5°F | Resp 16 | Ht <= 58 in | Wt 77.0 lb

## 2023-10-29 DIAGNOSIS — J449 Chronic obstructive pulmonary disease, unspecified: Secondary | ICD-10-CM

## 2023-10-29 DIAGNOSIS — I10 Essential (primary) hypertension: Secondary | ICD-10-CM | POA: Diagnosis not present

## 2023-10-29 DIAGNOSIS — F411 Generalized anxiety disorder: Secondary | ICD-10-CM | POA: Diagnosis not present

## 2023-10-29 DIAGNOSIS — Z79899 Other long term (current) drug therapy: Secondary | ICD-10-CM | POA: Diagnosis not present

## 2023-10-29 DIAGNOSIS — R0600 Dyspnea, unspecified: Secondary | ICD-10-CM | POA: Diagnosis not present

## 2023-10-29 DIAGNOSIS — R918 Other nonspecific abnormal finding of lung field: Secondary | ICD-10-CM | POA: Diagnosis not present

## 2023-10-29 MED ORDER — CLONAZEPAM 0.5 MG PO TABS
0.7500 mg | ORAL_TABLET | Freq: Every day | ORAL | 0 refills | Status: DC
Start: 2023-10-29 — End: 2024-02-13

## 2023-10-29 MED ORDER — CLONIDINE HCL 0.1 MG PO TABS: ORAL_TABLET | ORAL | 2 refills | Status: DC

## 2023-10-29 MED ORDER — DESIPRAMINE HCL 25 MG PO TABS: 25.0000 mg | ORAL_TABLET | Freq: Every day | ORAL | 3 refills | Status: DC

## 2023-10-29 MED ORDER — HYDRALAZINE HCL 25 MG PO TABS: 25.0000 mg | ORAL_TABLET | Freq: Three times a day (TID) | ORAL | 3 refills | Status: DC

## 2023-10-29 MED ORDER — LEVOTHYROXINE SODIUM 50 MCG PO TABS: 50.0000 ug | ORAL_TABLET | Freq: Every day | ORAL | 3 refills | Status: AC

## 2023-10-29 NOTE — Progress Notes (Signed)
 [facility] [address] [place], [place] [address]  Internal MEDICINE  Office Visit Note  Patient Name: Pam Cruz  [id number]  [id number]  Date of Service: 10/29/2023  Chief Complaint  Patient presents with   Gastroesophageal Reflux   Hypertension   Follow-up    HPI Timmya presents for a follow-up visit for COPD, anxiety and medication refills.  SOB and cough -- due to COPD Anxiety -- taking clonazepam , due for refills.  Emphysema -- still cough a lot with the anoro, has not tried triple therapy inhaler yet. Also has not had a chest xray in a few years.     Current Medication: Outpatient Encounter Medications as of 10/29/2023  Medication Sig   acetaminophen  (TYLENOL ) 500 MG tablet Take 500 mg by mouth every 6 (six) hours as needed (pain).    amLODipine  (NORVASC ) 5 MG tablet TAKE 1 TABLET BY MOUTH DAILY   ANORO ELLIPTA  62.5-25 MCG/ACT AEPB USE 1 INHALATION BY MOUTH DAILY   bimatoprost (LUMIGAN) 0.01 % SOLN INSTILL 1 DROP IN LEFT EYE AT BEDTIME   losartan  (COZAAR ) 50 MG tablet Take 1 tablet (50 mg total) by mouth 2 (two) times daily.   Multiple Vitamins-Minerals (PRESERVISION AREDS 2+MULTI VIT) CAPS Take 1 capsule by mouth in the morning and at bedtime.   omeprazole (PRILOSEC) 20 MG capsule Take 20 mg by mouth 2 (two) times daily before a meal.    Polyethyl Glycol-Propyl Glycol (SYSTANE) 0.4-0.3 % SOLN Apply to eye.   Polyethylene Glycol 400 (BLINK TEARS) 0.25 % SOLN Apply to eye.   Psyllium (METAMUCIL) 28.3 % POWD Take by mouth.   tacrolimus  (PROTOPIC ) 0.1 % ointment Apply to itchy red patches QD PRN.   triamcinolone  cream (KENALOG ) 0.1 % Apply once a day to aa legs on Friday, Saturday and Sundays. Avoid applying to face, groin, and axilla. Use as directed. Long-term use can cause thinning of the skin.   [DISCONTINUED] clonazePAM  (KLONOPIN ) 0.5 MG tablet Take 1.5 tablets (0.75 mg total) by mouth at bedtime.   [DISCONTINUED] cloNIDine  (CATAPRES ) 0.1 MG tablet  TAKE 1 TABLET BY MOUTH TWICE  DAILY MAY TAKE EXTRA TABLET IF  NEEDED. MAXIMUM 3 TABLETS A DAY.   [DISCONTINUED] desipramine  (NORPRAMIN ) 25 MG tablet Take 1 tablet (25 mg total) by mouth daily.   [DISCONTINUED] hydrALAZINE  (APRESOLINE ) 25 MG tablet TAKE 1 TABLET BY MOUTH 3 TIMES  DAILY   [DISCONTINUED] levothyroxine  (SYNTHROID ) 50 MCG tablet Take 1 tablet (50 mcg total) by mouth daily.   clonazePAM  (KLONOPIN ) 0.5 MG tablet Take 1.5 tablets (0.75 mg total) by mouth at bedtime.   cloNIDine  (CATAPRES ) 0.1 MG tablet TAKE 1 TABLET BY MOUTH TWICE  DAILY MAY TAKE EXTRA TABLET IF  NEEDED , MAXIMUM 3 TABLETS A DAY   desipramine  (NORPRAMIN ) 25 MG tablet Take 1 tablet (25 mg total) by mouth daily.   hydrALAZINE  (APRESOLINE ) 25 MG tablet Take 1 tablet (25 mg total) by mouth 3 (three) times daily.   levothyroxine  (SYNTHROID ) 50 MCG tablet Take 1 tablet (50 mcg total) by mouth daily.   No facility-administered encounter medications on file as of 10/29/2023.    Surgical History: Past Surgical History:  Procedure Laterality Date   COLONOSCOPY     cysto     LAPAROSCOPY     TOTAL HIP ARTHROPLASTY Right 08/17/2019   Procedure: TOTAL HIP ARTHROPLASTY;  Surgeon: Mardee Lynwood SQUIBB, MD;  Location: [facility] ORS;  Service: Orthopedics;  Laterality: Right;    Medical History: Past Medical History:  Diagnosis  Date   Anxiety    Arthritis    COPD (chronic obstructive pulmonary disease) (HCC)    Dyspnea    Gastritis    GERD (gastroesophageal reflux disease)    Hypertension    Osteoporosis     Family History: Family History  Problem Relation Age of Onset   Hypertension Mother    Coronary artery disease Father    Heart disease Father    Breast cancer Neg Hx     Social History   Socioeconomic History   Marital status: Single    Spouse name: Not on file   Number of children: Not on file   Years of education: Not on file   Highest education level: Not on file  Occupational History   Not on file  Tobacco  Use   Smoking status: Former    Current packs/day: 0.00    Types: Cigarettes    Quit date: 02/20/2001    Years since quitting: 22.7   Smokeless tobacco: Never   Tobacco comments:    quit 17 years ago  Vaping Use   Vaping status: Never Used  Substance and Sexual Activity   Alcohol use: Not Currently    Alcohol/week: 2.0 standard drinks of alcohol    Types: 2 Glasses of wine per week    Comment: very rarely   Drug use: Never   Sexual activity: Not on file  Other Topics Concern   Not on file  Social History Narrative   Not on file   Social Drivers of Health   Financial Resource Strain: Low Risk  (04/11/2021)   Overall Financial Resource Strain (CARDIA)    Difficulty of Paying Living Expenses: Not very hard  Food Insecurity: Not on file  Transportation Needs: Not on file  Physical Activity: Not on file  Stress: Not on file  Social Connections: Unknown (02/12/2022)   [facility]   Social Network    Social Network: Not on file  Intimate Partner Violence: Unknown (02/09/2022)   [facility]   HITS    Physically Hurt: Not on file    Insult or Talk Down To: Not on file    Threaten Physical Harm: Not on file    Scream or Curse: Not on file      Review of Systems  Constitutional:  Positive for fatigue.  HENT:  Positive for congestion and postnasal drip.   Respiratory:  Positive for cough, chest tightness and shortness of breath. Negative for wheezing.   Cardiovascular: Negative.  Negative for chest pain and palpitations.  Gastrointestinal: Negative.   Psychiatric/Behavioral:  Positive for sleep disturbance. Negative for self-injury and suicidal ideas. The patient is nervous/anxious.     Vital Signs: BP 120/78   Pulse 74   Temp 98.5 F (36.9 C)   Resp 16   Ht 4' 8 (1.422 m)   Wt 77 lb (34.9 kg)   SpO2 93%   BMI 17.26 kg/m    Physical Exam Vitals reviewed.  Constitutional:      General: She is not in  acute distress.    Appearance: Normal appearance. She is underweight. She is not ill-appearing.  HENT:     Head: Normocephalic and atraumatic.  Eyes:     Pupils: Pupils are equal, round, and reactive to light.  Cardiovascular:     Rate and Rhythm: Normal rate and regular rhythm.     Heart sounds: Normal heart sounds. No murmur heard.    No friction  rub.  Pulmonary:     Effort: Pulmonary effort is normal. No accessory muscle usage or respiratory distress.     Breath sounds: Decreased air movement present. Examination of the right-middle field reveals decreased breath sounds. Examination of the left-middle field reveals decreased breath sounds. Examination of the right-lower field reveals decreased breath sounds. Examination of the left-lower field reveals decreased breath sounds. Decreased breath sounds present.  Neurological:     Mental Status: She is alert and oriented to person, place, and time.  Psychiatric:        Mood and Affect: Mood normal.        Behavior: Behavior normal.        Assessment/Plan: 1. Chronic obstructive pulmonary disease, unspecified COPD type (HCC) (Primary) Patient given 4 week sample of trelegy to try, if it is helping more than anoro, will send a prescription to the pharmacy at her next office visit.  - DG Chest 2 View; Future  2. Benign hypertension Continue medications as prescribed, clonidine  refill ordered  - cloNIDine  (CATAPRES ) 0.1 MG tablet; TAKE 1 TABLET BY MOUTH TWICE  DAILY MAY TAKE EXTRA TABLET IF  NEEDED , MAXIMUM 3 TABLETS A DAY  Dispense: 300 tablet; Refill: 2  3. Encounter for medication review Continue medications as prescribed, medication list reviewed, updated and refills ordered  - hydrALAZINE  (APRESOLINE ) 25 MG tablet; Take 1 tablet (25 mg total) by mouth 3 (three) times daily.  Dispense: 240 tablet; Refill: 3 - levothyroxine  (SYNTHROID ) 50 MCG tablet; Take 1 tablet (50 mcg total) by mouth daily.  Dispense: 90 tablet; Refill: 3 -  desipramine  (NORPRAMIN ) 25 MG tablet; Take 1 tablet (25 mg total) by mouth daily.  Dispense: 100 tablet; Refill: 3  4. GAD (generalized anxiety disorder) Continue clonazepam  as prescribed. Follow up in 3 months for addition refill. UDS due at next office visit.  - clonazePAM  (KLONOPIN ) 0.5 MG tablet; Take 1.5 tablets (0.75 mg total) by mouth at bedtime.  Dispense: 135 tablet; Refill: 0   General Counseling: shamieka gullo understanding of the findings of todays visit and agrees with plan of treatment. I have discussed any further diagnostic evaluation that may be needed or ordered today. We also reviewed her medications today. she has been encouraged to call the office with any questions or concerns that should arise related to todays visit.    Orders Placed This Encounter  Procedures   DG Chest 2 View    Meds ordered this encounter  Medications   clonazePAM  (KLONOPIN ) 0.5 MG tablet    Sig: Take 1.5 tablets (0.75 mg total) by mouth at bedtime.    Dispense:  135 tablet    Refill:  0   hydrALAZINE  (APRESOLINE ) 25 MG tablet    Sig: Take 1 tablet (25 mg total) by mouth 3 (three) times daily.    Dispense:  240 tablet    Refill:  3    Requesting 1 year supply   levothyroxine  (SYNTHROID ) 50 MCG tablet    Sig: Take 1 tablet (50 mcg total) by mouth daily.    Dispense:  90 tablet    Refill:  3   desipramine  (NORPRAMIN ) 25 MG tablet    Sig: Take 1 tablet (25 mg total) by mouth daily.    Dispense:  100 tablet    Refill:  3    Please send a replace/new response with 100-Day Supply if appropriate to maximize member benefit. Requesting 1 year supply.   cloNIDine  (CATAPRES ) 0.1 MG tablet    Sig: TAKE  1 TABLET BY MOUTH TWICE  DAILY MAY TAKE EXTRA TABLET IF  NEEDED , MAXIMUM 3 TABLETS A DAY    Dispense:  300 tablet    Refill:  2    Please send a replace/new response with 100-Day Supply if appropriate to maximize member benefit. Requesting 1 year supply.    Return in about 1 month (around  11/29/2023) for F/U, Samadhi Mahurin PCP chest xray and trelegy ellipta .   Total time spent:30 Minutes Time spent includes review of chart, medications, test results, and follow up plan with the patient.   [place] Controlled Substance Database was reviewed by me.  This patient was seen by Mardy Maxin, FNP-C in collaboration with Dr. Sigrid Bathe as a part of collaborative care agreement.   Ceirra Belli R. Maxin, MSN, FNP-C Internal medicine

## 2023-10-30 ENCOUNTER — Encounter: Payer: Self-pay | Admitting: Nurse Practitioner

## 2023-10-30 ENCOUNTER — Ambulatory Visit: Payer: 59 | Admitting: Nurse Practitioner

## 2023-11-21 DIAGNOSIS — Z961 Presence of intraocular lens: Secondary | ICD-10-CM | POA: Diagnosis not present

## 2023-11-21 DIAGNOSIS — H35372 Puckering of macula, left eye: Secondary | ICD-10-CM | POA: Diagnosis not present

## 2023-11-21 DIAGNOSIS — H353211 Exudative age-related macular degeneration, right eye, with active choroidal neovascularization: Secondary | ICD-10-CM | POA: Diagnosis not present

## 2023-11-28 ENCOUNTER — Encounter: Payer: Self-pay | Admitting: Nurse Practitioner

## 2023-11-28 ENCOUNTER — Ambulatory Visit: Payer: 59 | Admitting: Nurse Practitioner

## 2023-11-28 VITALS — BP 120/76 | HR 70 | Temp 97.3°F | Resp 16 | Ht <= 58 in | Wt 77.8 lb

## 2023-11-28 DIAGNOSIS — I10 Essential (primary) hypertension: Secondary | ICD-10-CM

## 2023-11-28 DIAGNOSIS — F411 Generalized anxiety disorder: Secondary | ICD-10-CM | POA: Diagnosis not present

## 2023-11-28 DIAGNOSIS — J449 Chronic obstructive pulmonary disease, unspecified: Secondary | ICD-10-CM | POA: Diagnosis not present

## 2023-11-28 MED ORDER — TRELEGY ELLIPTA 100-62.5-25 MCG/ACT IN AEPB: 1.0000 | INHALATION_SPRAY | Freq: Every day | RESPIRATORY_TRACT | 11 refills | Status: DC

## 2023-11-28 NOTE — Progress Notes (Signed)
 [facility] [address] [place], [place] [address]  Internal MEDICINE  Office Visit Note  Patient Name: Pam Cruz  [id number]  [id number]  Date of Service: 11/28/2023  Chief Complaint  Patient presents with   Gastroesophageal Reflux   Hypertension   Follow-up    Review chest x-ray    HPI Britni presents for a follow-up visit for COPD, hypertension and anxiety.  COPD -- has been using anoro ellipta but still coughing up a lot of phlegm in the morning and evening.  PFT due now but need to find out out of pocket cost before scheduling.  Chest xray -- updated chest xray results shows emphysema Hypertension -- controlled with current medications -- amlodipine, clonidine, hydralazine and losartan.  Anxiety -- takes clonazepam as needed at bedtime.    Current Medication: Outpatient Encounter Medications as of 11/28/2023  Medication Sig   acetaminophen (TYLENOL) 500 MG tablet Take 500 mg by mouth every 6 (six) hours as needed (pain).    amLODipine (NORVASC) 5 MG tablet TAKE 1 TABLET BY MOUTH DAILY   bimatoprost (LUMIGAN) 0.01 % SOLN INSTILL 1 DROP IN LEFT EYE AT BEDTIME   clonazePAM (KLONOPIN) 0.5 MG tablet Take 1.5 tablets (0.75 mg total) by mouth at bedtime.   cloNIDine (CATAPRES) 0.1 MG tablet TAKE 1 TABLET BY MOUTH TWICE  DAILY MAY TAKE EXTRA TABLET IF  NEEDED , MAXIMUM 3 TABLETS A DAY   desipramine (NORPRAMIN) 25 MG tablet Take 1 tablet (25 mg total) by mouth daily.   Fluticasone-Umeclidin-Vilant (TRELEGY ELLIPTA) 100-62.5-25 MCG/ACT AEPB Inhale 1 puff into the lungs daily.   hydrALAZINE (APRESOLINE) 25 MG tablet Take 1 tablet (25 mg total) by mouth 3 (three) times daily.   levothyroxine (SYNTHROID) 50 MCG tablet Take 1 tablet (50 mcg total) by mouth daily.   losartan (COZAAR) 50 MG tablet Take 1 tablet (50 mg total) by mouth 2 (two) times daily.   Multiple Vitamins-Minerals (PRESERVISION AREDS 2+MULTI VIT) CAPS Take 1 capsule by mouth in the morning and at  bedtime.   omeprazole (PRILOSEC) 20 MG capsule Take 20 mg by mouth 2 (two) times daily before a meal.    Polyethyl Glycol-Propyl Glycol (SYSTANE) 0.4-0.3 % SOLN Apply to eye.   Polyethylene Glycol 400 (BLINK TEARS) 0.25 % SOLN Apply to eye.   Psyllium (METAMUCIL) 28.3 % POWD Take by mouth.   tacrolimus (PROTOPIC) 0.1 % ointment Apply to itchy red patches QD PRN.   triamcinolone cream (KENALOG) 0.1 % Apply once a day to aa legs on Friday, Saturday and Sundays. Avoid applying to face, groin, and axilla. Use as directed. Long-term use can cause thinning of the skin.   [DISCONTINUED] ANORO ELLIPTA 62.5-25 MCG/ACT AEPB USE 1 INHALATION BY MOUTH DAILY   No facility-administered encounter medications on file as of 11/28/2023.    Surgical History: Past Surgical History:  Procedure Laterality Date   COLONOSCOPY     cysto     LAPAROSCOPY     TOTAL HIP ARTHROPLASTY Right 08/17/2019   Procedure: TOTAL HIP ARTHROPLASTY;  Surgeon: Donato Heinz, MD;  Location: [facility] ORS;  Service: Orthopedics;  Laterality: Right;    Medical History: Past Medical History:  Diagnosis Date   Anxiety    Arthritis    COPD (chronic obstructive pulmonary disease) (HCC)    Dyspnea    Gastritis    GERD (gastroesophageal reflux disease)    Hypertension    Osteoporosis     Family History: Family History  Problem Relation Age of  Onset   Hypertension Mother    Coronary artery disease Father    Heart disease Father    Breast cancer Neg Hx     Social History   Socioeconomic History   Marital status: Single    Spouse name: Not on file   Number of children: Not on file   Years of education: Not on file   Highest education level: Not on file  Occupational History   Not on file  Tobacco Use   Smoking status: Former    Current packs/day: 0.00    Types: Cigarettes    Quit date: 02/20/2001    Years since quitting: 22.8   Smokeless tobacco: Never   Tobacco comments:    quit 17 years ago  Vaping Use   Vaping  status: Never Used  Substance and Sexual Activity   Alcohol use: Not Currently    Alcohol/week: 2.0 standard drinks of alcohol    Types: 2 Glasses of wine per week    Comment: very rarely   Drug use: Never   Sexual activity: Not on file  Other Topics Concern   Not on file  Social History Narrative   Not on file   Social Drivers of Health   Financial Resource Strain: High Risk (11/29/2023)   Received from [facility]   Overall Financial Resource Strain (CARDIA)    Difficulty of Paying Living Expenses: Hard  Food Insecurity: No Food Insecurity (11/29/2023)   Received from [facility]   Hunger Vital Sign    Worried About Running Out of Food in the Last Year: Never true    Ran Out of Food in the Last Year: Never true  Transportation Needs: No Transportation Needs (11/29/2023)   Received from [facility] - Transportation    In the past 12 months, has lack of transportation kept you from medical appointments or from getting medications?: No    Lack of Transportation (Non-Medical): No  Physical Activity: Not on file  Stress: Not on file  Social Connections: Unknown (02/12/2022)   Received from [facility]   Social Network    Social Network: Not on file  Intimate Partner Violence: Unknown (02/09/2022)   Received from [facility]   HITS    Physically Hurt: Not on file    Insult or Talk Down To: Not on file    Threaten Physical Harm: Not on file    Scream or Curse: Not on file      Review of Systems  Constitutional:  Positive for fatigue.  HENT:  Positive for congestion and postnasal drip.   Respiratory:  Positive for cough, chest tightness and shortness of breath. Negative for wheezing.   Cardiovascular: Negative.  Negative for chest pain and palpitations.  Gastrointestinal: Negative.   Psychiatric/Behavioral:  Positive for sleep disturbance. Negative for self-injury and suicidal  ideas. The patient is nervous/anxious.     Vital Signs: BP 120/76   Pulse 70   Temp (!) 97.3 F (36.3 C)   Resp 16   Ht 4\' 8"  (1.422 m)   Wt 77 lb 12.8 oz (35.3 kg)   SpO2 95%   BMI 17.44 kg/m    Physical Exam Vitals reviewed.  Constitutional:      General: She is not in acute distress.    Appearance: Normal appearance. She is underweight. She is not ill-appearing.  HENT:     Head: Normocephalic and atraumatic.  Eyes:  Pupils: Pupils are equal, round, and reactive to light.  Cardiovascular:     Rate and Rhythm: Normal rate and regular rhythm.     Heart sounds: Normal heart sounds. No murmur heard.    No friction rub.  Pulmonary:     Effort: Pulmonary effort is normal. No accessory muscle usage or respiratory distress.     Breath sounds: Decreased air movement present. Examination of the right-middle field reveals decreased breath sounds. Examination of the left-middle field reveals decreased breath sounds. Examination of the right-lower field reveals decreased breath sounds. Examination of the left-lower field reveals decreased breath sounds. Decreased breath sounds present.  Neurological:     Mental Status: She is alert and oriented to person, place, and time.  Psychiatric:        Mood and Affect: Mood normal.        Behavior: Behavior normal.        Assessment/Plan: 1. Chronic obstructive pulmonary disease, unspecified COPD type (HCC) (Primary) Willl try trelegy, samples given today.  - Fluticasone-Umeclidin-Vilant (TRELEGY ELLIPTA) 100-62.5-25 MCG/ACT AEPB; Inhale 1 puff into the lungs daily.  Dispense: 180 each; Refill: 11  2. Benign hypertension Stable, continue current medications as prescribed.   3. GAD (generalized anxiety disorder) Continue prn clonazepam as prescribed.    General Counseling: mc hollen understanding of the findings of todays visit and agrees with plan of treatment. I have discussed any further diagnostic evaluation that may be  needed or ordered today. We also reviewed her medications today. she has been encouraged to call the office with any questions or concerns that should arise related to todays visit.    No orders of the defined types were placed in this encounter.   Meds ordered this encounter  Medications   Fluticasone-Umeclidin-Vilant (TRELEGY ELLIPTA) 100-62.5-25 MCG/ACT AEPB    Sig: Inhale 1 puff into the lungs daily.    Dispense:  180 each    Refill:  11    Fill new script, discontinue anoro.    Return in about 3 months (around 02/25/2024) for F/U, Sameeha Rockefeller PCP.   Total time spent:30 Minutes Time spent includes review of chart, medications, test results, and follow up plan with the patient.   [place] Controlled Substance Database was reviewed by me.  This patient was seen by Sallyanne Kuster, FNP-C in collaboration with Dr. Beverely Risen as a part of collaborative care agreement.   Joeanne Robicheaux R. Tedd Sias, MSN, FNP-C Internal medicine

## 2023-11-29 ENCOUNTER — Telehealth: Payer: Self-pay | Admitting: Nurse Practitioner

## 2023-11-29 DIAGNOSIS — L851 Acquired keratosis [keratoderma] palmaris et plantaris: Secondary | ICD-10-CM | POA: Diagnosis not present

## 2023-11-29 DIAGNOSIS — B351 Tinea unguium: Secondary | ICD-10-CM | POA: Diagnosis not present

## 2023-11-29 DIAGNOSIS — M79674 Pain in right toe(s): Secondary | ICD-10-CM | POA: Diagnosis not present

## 2023-11-29 DIAGNOSIS — M79675 Pain in left toe(s): Secondary | ICD-10-CM | POA: Diagnosis not present

## 2023-11-29 NOTE — Telephone Encounter (Signed)
Patient possibly considering pft, wanted to know what she would owe. I lvm that per Chel P. With her insurance, there is no deductible or coins and she will need to pay $50 copay at time of service-Toni

## 2023-12-03 ENCOUNTER — Telehealth: Payer: Self-pay

## 2023-12-03 ENCOUNTER — Telehealth: Payer: Self-pay | Admitting: Nurse Practitioner

## 2023-12-04 ENCOUNTER — Telehealth: Payer: Self-pay | Admitting: Nurse Practitioner

## 2023-12-04 NOTE — Telephone Encounter (Signed)
Patient called yesterday. She declined to have pft done due to cost. Sent message to [employer]

## 2023-12-04 NOTE — Telephone Encounter (Signed)
error 

## 2023-12-26 ENCOUNTER — Ambulatory Visit: Payer: 59 | Admitting: Dermatology

## 2024-01-02 ENCOUNTER — Ambulatory Visit: Admitting: Dermatology

## 2024-01-02 DIAGNOSIS — L817 Pigmented purpuric dermatosis: Secondary | ICD-10-CM

## 2024-01-02 DIAGNOSIS — R21 Rash and other nonspecific skin eruption: Secondary | ICD-10-CM | POA: Diagnosis not present

## 2024-01-02 DIAGNOSIS — L299 Pruritus, unspecified: Secondary | ICD-10-CM

## 2024-01-02 MED ORDER — CLOBETASOL PROPIONATE 0.05 % EX CREA: TOPICAL_CREAM | CUTANEOUS | 0 refills | Status: DC

## 2024-01-02 NOTE — Patient Instructions (Addendum)
 Start Clobetasol cream - apply to itchy rash on legs twice daily until improved. Avoid applying to face, groin, and axilla. Use as directed. Long-term use can cause thinning of the skin.   Wound Care Instructions  Cleanse wound gently with soap and water once a day then pat dry with clean gauze. Apply a thin coat of Petrolatum (petroleum jelly, "Vaseline") over the wound (unless you have an allergy to this). We recommend that you use a new, sterile tube of Vaseline. Do not pick or remove scabs. Do not remove the yellow or white "healing tissue" from the base of the wound.  Cover the wound with fresh, clean, nonstick gauze and secure with paper tape. You may use Band-Aids in place of gauze and tape if the wound is small enough, but would recommend trimming much of the tape off as there is often too much. Sometimes Band-Aids can irritate the skin.  You should call the office for your biopsy report after 1 week if you have not already been contacted.  If you experience any problems, such as abnormal amounts of bleeding, swelling, significant bruising, significant pain, or evidence of infection, please call the office immediately.  FOR ADULT SURGERY PATIENTS: If you need something for pain relief you may take 1 extra strength Tylenol (acetaminophen) AND 2 Ibuprofen (200mg  each) together every 4 hours as needed for pain. (do not take these if you are allergic to them or if you have a reason you should not take them.) Typically, you may only need pain medication for 1 to 3 days.      Due to recent changes in healthcare laws, you may see results of your pathology and/or laboratory studies on MyChart before the doctors have had a chance to review them. We understand that in some cases there may be results that are confusing or concerning to you. Please understand that not all results are received at the same time and often the doctors may need to interpret multiple results in order to provide you with the  best plan of care or course of treatment. Therefore, we ask that you please give [place] 2 business days to thoroughly review all your results before contacting the office for clarification. Should we see a critical lab result, you will be contacted sooner.   If You Need Anything After Your Visit  If you have any questions or concerns for your doctor, please call our main line at [phone] and press option 4 to reach your doctor's medical assistant. If no one answers, please leave a voicemail as directed and we will return your call as soon as possible. Messages left after 4 pm will be answered the following business day.   You may also send [place] a message via MyChart. We typically respond to MyChart messages within 1-2 business days.  For prescription refills, please ask your pharmacy to contact our office. Our fax number is [phone].  If you have an urgent issue when the clinic is closed that cannot wait until the next business day, you can page your doctor at the number below.    Please note that while we do our best to be available for urgent issues outside of office hours, we are not available 24/7.   If you have an urgent issue and are unable to reach [place], you may choose to seek medical care at your doctor's office, retail clinic, urgent care center, or emergency room.  If you have a medical emergency, please immediately call 911 or go  to the emergency department.  Pager Numbers  - Dr. Gwen Pounds: [phone]  - Dr. Roseanne Reno: [phone]  - Dr. Katrinka Blazing: [phone]   In the event of inclement weather, please call our main line at [phone] for an update on the status of any delays or closures.  Dermatology Medication Tips: Please keep the boxes that topical medications come in in order to help keep track of the instructions about where and how to use these. Pharmacies typically print the medication instructions only on the boxes and not directly on the medication tubes.   If  your medication is too expensive, please contact our office at [phone] option 4 or send [place] a message through MyChart.   We are unable to tell what your co-pay for medications will be in advance as this is different depending on your insurance coverage. However, we may be able to find a substitute medication at lower cost or fill out paperwork to get insurance to cover a needed medication.   If a prior authorization is required to get your medication covered by your insurance company, please allow [place] 1-2 business days to complete this process.  Drug prices often vary depending on where the prescription is filled and some pharmacies may offer cheaper prices.  The website www.goodrx.com contains coupons for medications through different pharmacies. The prices here do not account for what the cost may be with help from insurance (it may be cheaper with your insurance), but the website can give you the price if you did not use any insurance.  - You can print the associated coupon and take it with your prescription to the pharmacy.  - You may also stop by our office during regular business hours and pick up a GoodRx coupon card.  - If you need your prescription sent electronically to a different pharmacy, notify our office through [facility] or by phone at [phone] option 4.     Si Usted Necesita Algo Despus de Su Visita  Tambin puede enviarnos un mensaje a travs de Clinical cytogeneticist. Por lo general respondemos a los mensajes de MyChart en el transcurso de 1 a 2 das hbiles.  Para renovar recetas, por favor pida a su farmacia que se ponga en contacto con nuestra oficina. Annie Sable de fax es [place] [phone].  Si tiene un asunto urgente cuando la clnica est cerrada y que no puede esperar hasta el siguiente da hbil, puede llamar/localizar a su doctor(a) al nmero que aparece a continuacin.   Por favor, tenga en cuenta que aunque hacemos todo lo posible para estar disponibles para  asuntos urgentes fuera del horario de [place], no estamos disponibles las 24 horas del da, los 7 [address]  [address] de la [place].   Si tiene un problema urgente y no puede comunicarse con nosotros, puede optar por buscar atencin mdica  en el consultorio de su doctor(a), en una clnica privada, en un centro de atencin urgente o en una sala de emergencias.  Si tiene Engineer, drilling, por favor llame inmediatamente al 911 o vaya a la sala de emergencias.  Nmeros de bper  - Dr. Gwen Pounds: [phone]  - Dra. Roseanne Reno: [phone]  - Dr. Katrinka Blazing: [phone]   En caso de inclemencias del tiempo, por favor llame a Lacy Duverney principal al [phone] para una actualizacin sobre el [place] de cualquier retraso o cierre.  Consejos para la medicacin en dermatologa: Por favor, guarde las cajas en las que vienen los medicamentos de uso tpico para ayudarle a seguir las instrucciones sobre dnde  y cmo usarlos. Las farmacias generalmente imprimen las instrucciones del medicamento slo en las cajas y no directamente en los tubos del [place].   Si su medicamento es muy caro, por favor, pngase en contacto con Rolm Gala llamando al [phone] y presione la opcin 4 o envenos un mensaje a travs de Clinical cytogeneticist.   No podemos decirle cul ser su copago por los medicamentos por adelantado ya que esto es diferente dependiendo de la cobertura de su seguro. Sin embargo, es posible que podamos encontrar un medicamento sustituto a Audiological scientist un formulario para que el seguro cubra el medicamento que se considera necesario.   Si se requiere una autorizacin previa para que su compaa de seguros [place] su medicamento, por favor permtanos de 1 a 2 das hbiles para completar [address]  Los precios de los medicamentos varan con frecuencia dependiendo del Environmental consultant de dnde se surte la receta y alguna farmacias pueden ofrecer precios ms baratos.  El sitio web www.goodrx.com tiene cupones para  medicamentos de Health and safety inspector. Los precios aqu no tienen en cuenta lo que podra costar con la ayuda del seguro (puede ser ms barato con su seguro), pero el sitio web puede darle el precio si no utiliz Tourist information centre manager.  - Puede imprimir el cupn correspondiente y llevarlo con su receta a la farmacia.  - Tambin puede pasar por nuestra oficina durante el horario de atencin regular y Education officer, museum una tarjeta de cupones de GoodRx.  - Si necesita que su receta se enve electrnicamente a una farmacia diferente, informe a nuestra oficina a travs de MyChart de [place] o por telfono llamando al [phone] y presione la opcin 4.

## 2024-01-02 NOTE — Progress Notes (Signed)
 Follow-Up Visit   Subjective  Pam Cruz is a 89 y.o. female who presents for the following: Atopic Dermatitis of the legs. She is using tacrolimus ointment Mon - Thurs once a day, and TMC 0.1% Cream once a day Fri, Sat, Sun. Rash is not improving and she is still itchy. She also has a spot on her right nose that she would like checked.    The following portions of the chart were reviewed this encounter and updated as appropriate: medications, allergies, medical history  Review of Systems:  No other skin or systemic complaints except as noted in HPI or Assessment and Plan.  Objective  Well appearing patient in no apparent distress; mood and affect are within normal limits.  Areas Examined: Face, legs  Relevant physical exam findings are noted in the Assessment and Plan.  Left anterior thigh, Left upper calf Small pink violaceous macules thighs, some with mild scale on the calves, popliteal;   Assessment & Plan   RASH Left anterior thigh, Left upper calf With Pruritus.  Not responding to treatment. Chronic and persistent condition with duration or expected duration over one year. Condition is bothersome/symptomatic for patient. Currently flared.   Differential diagnosis: Dermatitis vs Pigmented Purpura vs Vasculitis vs other  Treatment Plan: Punch biopsies today x 2  Start Clobetasol cream Apply to itchy rash BID until improved dsp 45g 0Rf.  Avoid applying to face, groin, and axilla. Use as directed. Long-term use can cause thinning of the skin.  d/c tacrolimus ointment and TMC cream  Topical steroids (such as triamcinolone, fluocinolone, fluocinonide, mometasone, clobetasol, halobetasol, betamethasone, hydrocortisone) can cause thinning and lightening of the skin if they are used for too long in the same area. Your physician has selected the right strength medicine for your problem and area affected on the body. Please use your medication only as directed by your  physician to prevent side effects.   Recommend gentle skin care. Skin / nail biopsy - Left anterior thigh Type of biopsy: punch   Informed consent: discussed and consent obtained   Patient was prepped and draped in usual sterile fashion: Area prepped with alcohol. Anesthesia: the lesion was anesthetized in a standard fashion   Anesthetic:  1% lidocaine w/ epinephrine 1-100,000 buffered w/ 8.4% NaHCO3 Punch size:  3.5 mm Suture size:  4-0 Suture type: nylon   Suture removal (days):  7 Hemostasis achieved with: suture and pressure   Outcome: patient tolerated procedure well   Post-procedure details: wound care instructions given   Post-procedure details comment:  Ointment and pressure dressing applied  Skin / nail biopsy - Left upper calf Type of biopsy: punch   Informed consent: discussed and consent obtained   Patient was prepped and draped in usual sterile fashion: Area prepped with alcohol. Anesthesia: the lesion was anesthetized in a standard fashion   Anesthetic:  1% lidocaine w/ epinephrine 1-100,000 buffered w/ 8.4% NaHCO3 Punch size:  3.5 mm Suture size:  4-0 Suture type: nylon   Suture removal (days):  7 Hemostasis achieved with: suture and pressure   Outcome: patient tolerated procedure well   Post-procedure details: wound care instructions given   Post-procedure details comment:  Ointment and pressure dressing applied Specimen 1 - Surgical pathology Differential Diagnosis: Dermatitis vs Pigmented Purpura vs Vasculitis vs other Check Margins: No 3.5 mm punch Itchy rash not responsive to topical steroids.   Specimen 2 - Surgical pathology Differential Diagnosis: Dermatitis vs Pigmented Purpura vs Vasculitis vs other Check Margins: No 3.5  mm punch Itchy rash not responsive to topical steroids.    PRURITUS    Return in about 1 week (around 01/09/2024) for bx f/u and SR, will check nose  on f/up.  ICherlyn Labella, CMA, am acting as scribe for Willeen Niece, MD  .   Documentation: I have reviewed the above documentation for accuracy and completeness, and I agree with the above.  Willeen Niece, MD

## 2024-01-05 ENCOUNTER — Encounter: Payer: Self-pay | Admitting: Nurse Practitioner

## 2024-01-06 ENCOUNTER — Telehealth: Payer: Self-pay

## 2024-01-06 ENCOUNTER — Encounter: Payer: Self-pay | Admitting: Nurse Practitioner

## 2024-01-06 ENCOUNTER — Ambulatory Visit: Admitting: Nurse Practitioner

## 2024-01-06 VITALS — BP 110/66 | HR 84 | Temp 98.4°F | Resp 16 | Ht <= 58 in | Wt 76.8 lb

## 2024-01-06 DIAGNOSIS — L282 Other prurigo: Secondary | ICD-10-CM

## 2024-01-06 DIAGNOSIS — J0100 Acute maxillary sinusitis, unspecified: Secondary | ICD-10-CM

## 2024-01-06 DIAGNOSIS — J449 Chronic obstructive pulmonary disease, unspecified: Secondary | ICD-10-CM

## 2024-01-06 LAB — SURGICAL PATHOLOGY

## 2024-01-06 MED ORDER — TRELEGY ELLIPTA 100-62.5-25 MCG/ACT IN AEPB: 1.0000 | INHALATION_SPRAY | Freq: Every day | RESPIRATORY_TRACT | 11 refills | Status: AC

## 2024-01-06 MED ORDER — AZITHROMYCIN 250 MG PO TABS: ORAL_TABLET | ORAL | 0 refills | Status: AC

## 2024-01-06 NOTE — Telephone Encounter (Signed)
 Patient LM on VM asking if she could take a bath.  LM on VM it is okay to take a bath after punch biopsy procedure she had done here

## 2024-01-06 NOTE — Progress Notes (Signed)
 [facility] [address] [place], [place] [address]  Internal MEDICINE  Office Visit Note  Patient Name: Pam Cruz  [id number]  [id number]  Date of Service: 01/06/2024  Chief Complaint  Patient presents with   Acute Visit    Late Friday night, not eating, sore,  coughing up brownish/grayish mucus      HPI Pam Cruz presents for an acute sick visit for possible URI --onset of symptoms was about 3-4 days ago. -- reports fatigue, weakness, malaise, cough, runny nose, poor appetite, body aches, discolored sputum/drainage. Also patient has decided to stay on the trelegy ellipta .      Current Medication:  Outpatient Encounter Medications as of 01/06/2024  Medication Sig   azithromycin  (ZITHROMAX ) 250 MG tablet Take 2 tablets on day 1, then 1 tablet daily on days 2 through 5   acetaminophen  (TYLENOL ) 500 MG tablet Take 500 mg by mouth every 6 (six) hours as needed (pain).    amLODipine  (NORVASC ) 5 MG tablet TAKE 1 TABLET BY MOUTH DAILY   bimatoprost (LUMIGAN) 0.01 % SOLN INSTILL 1 DROP IN LEFT EYE AT BEDTIME   clobetasol  cream (TEMOVATE ) 0.05 % Apply to itchy rash twice daily until improved. Avoid applying to face, groin, and axilla. Use as directed. Long-term use can cause thinning of the skin.   clonazePAM  (KLONOPIN ) 0.5 MG tablet Take 1.5 tablets (0.75 mg total) by mouth at bedtime.   cloNIDine  (CATAPRES ) 0.1 MG tablet TAKE 1 TABLET BY MOUTH TWICE  DAILY MAY TAKE EXTRA TABLET IF  NEEDED , MAXIMUM 3 TABLETS A DAY   desipramine  (NORPRAMIN ) 25 MG tablet Take 1 tablet (25 mg total) by mouth daily.   Fluticasone -Umeclidin-Vilant (TRELEGY ELLIPTA ) 100-62.5-25 MCG/ACT AEPB Inhale 1 puff into the lungs daily.   hydrALAZINE  (APRESOLINE ) 25 MG tablet Take 1 tablet (25 mg total) by mouth 3 (three) times daily.   levothyroxine  (SYNTHROID ) 50 MCG tablet Take 1 tablet (50 mcg total) by mouth daily.   losartan  (COZAAR ) 50 MG tablet Take 1 tablet (50 mg total) by mouth 2 (two) times  daily.   Multiple Vitamins-Minerals (PRESERVISION AREDS 2+MULTI VIT) CAPS Take 1 capsule by mouth in the morning and at bedtime.   omeprazole (PRILOSEC) 20 MG capsule Take 20 mg by mouth 2 (two) times daily before a meal.    Polyethyl Glycol-Propyl Glycol (SYSTANE) 0.4-0.3 % SOLN Apply to eye.   Polyethylene Glycol 400 (BLINK TEARS) 0.25 % SOLN Apply to eye.   Psyllium (METAMUCIL) 28.3 % POWD Take by mouth.   tacrolimus  (PROTOPIC ) 0.1 % ointment Apply to itchy red patches QD PRN.   triamcinolone  cream (KENALOG ) 0.1 % Apply once a day to aa legs on Friday, Saturday and Sundays. Avoid applying to face, groin, and axilla. Use as directed. Long-term use can cause thinning of the skin.   [DISCONTINUED] Fluticasone -Umeclidin-Vilant (TRELEGY ELLIPTA ) 100-62.5-25 MCG/ACT AEPB Inhale 1 puff into the lungs daily.   No facility-administered encounter medications on file as of 01/06/2024.      Medical History: Past Medical History:  Diagnosis Date   Anxiety    Arthritis    COPD (chronic obstructive pulmonary disease) (HCC)    Dyspnea    Gastritis    GERD (gastroesophageal reflux disease)    Hypertension    Osteoporosis      Vital Signs: BP 110/66   Pulse 84   Temp 98.4 F (36.9 C)   Resp 16   Ht 4\' 8"  (1.422 m)   Wt 76 lb 12.8 oz (34.8  kg)   SpO2 95%   BMI 17.22 kg/m    Review of Systems  Constitutional:  Positive for fatigue.  HENT:  Positive for congestion and postnasal drip.   Respiratory:  Positive for cough, chest tightness and shortness of breath. Negative for wheezing.   Cardiovascular: Negative.  Negative for chest pain and palpitations.  Gastrointestinal: Negative.   Psychiatric/Behavioral:  Positive for sleep disturbance. Negative for self-injury and suicidal ideas. The patient is nervous/anxious.     Physical Exam Vitals reviewed.  Constitutional:      General: She is not in acute distress.    Appearance: Normal appearance. She is underweight. She is not  ill-appearing.  HENT:     Head: Normocephalic and atraumatic.  Eyes:     Pupils: Pupils are equal, round, and reactive to light.  Cardiovascular:     Rate and Rhythm: Normal rate and regular rhythm.     Heart sounds: Normal heart sounds. No murmur heard.    No friction rub.  Pulmonary:     Effort: Pulmonary effort is normal. No accessory muscle usage or respiratory distress.     Breath sounds: Decreased air movement present. Examination of the right-middle field reveals decreased breath sounds. Examination of the left-middle field reveals decreased breath sounds. Examination of the right-lower field reveals decreased breath sounds. Examination of the left-lower field reveals decreased breath sounds. Decreased breath sounds present.  Neurological:     Mental Status: She is alert and oriented to person, place, and time.  Psychiatric:        Mood and Affect: Mood normal.        Behavior: Behavior normal.       Assessment/Plan: 1. Acute non-recurrent maxillary sinusitis (Primary) Zpak prescribed, take until gone  - azithromycin  (ZITHROMAX ) 250 MG tablet; Take 2 tablets on day 1, then 1 tablet daily on days 2 through 5  Dispense: 6 tablet; Refill: 0  2. Chronic obstructive pulmonary disease, unspecified COPD type (HCC) Continue trelegy ellipta . Discontinue anoro.  - Fluticasone -Umeclidin-Vilant (TRELEGY ELLIPTA ) 100-62.5-25 MCG/ACT AEPB; Inhale 1 puff into the lungs daily.  Dispense: 180 each; Refill: 11  3. Pruritic rash Dermatology took biopsies, waiting for results. Using clobetasol  cream on rash    General Counseling: Pam Cruz understanding of the findings of todays visit and agrees with plan of treatment. I have discussed any further diagnostic evaluation that may be needed or ordered today. We also reviewed her medications today. she has been encouraged to call the office with any questions or concerns that should arise related to todays visit.    Counseling:    No  orders of the defined types were placed in this encounter.   Meds ordered this encounter  Medications   Fluticasone -Umeclidin-Vilant (TRELEGY ELLIPTA ) 100-62.5-25 MCG/ACT AEPB    Sig: Inhale 1 puff into the lungs daily.    Dispense:  180 each    Refill:  11    Patient has changed her mind and wants to stay on trelegy. Please keep refills on file and discontinue anoro.   azithromycin  (ZITHROMAX ) 250 MG tablet    Sig: Take 2 tablets on day 1, then 1 tablet daily on days 2 through 5    Dispense:  6 tablet    Refill:  0    Fill new script today asap    Return if symptoms worsen or fail to improve.  [place] Controlled Substance Database was reviewed by me for overdose risk score (ORS)  Time spent:30 Minutes Time spent with patient  included reviewing progress notes, labs, imaging studies, and discussing plan for follow up.   This patient was seen by Laurence Pons, FNP-C in collaboration with Dr. Verneta Gone as a part of collaborative care agreement.  Alphonsus Doyel R. Bobbi Burow, MSN, FNP-C Internal Medicine

## 2024-01-07 NOTE — Telephone Encounter (Signed)
 Done

## 2024-01-09 ENCOUNTER — Ambulatory Visit (INDEPENDENT_AMBULATORY_CARE_PROVIDER_SITE_OTHER): Admitting: Dermatology

## 2024-01-09 DIAGNOSIS — L821 Other seborrheic keratosis: Secondary | ICD-10-CM | POA: Diagnosis not present

## 2024-01-09 DIAGNOSIS — D692 Other nonthrombocytopenic purpura: Secondary | ICD-10-CM

## 2024-01-09 DIAGNOSIS — R21 Rash and other nonspecific skin eruption: Secondary | ICD-10-CM

## 2024-01-09 MED ORDER — CLOBETASOL PROPIONATE 0.05 % EX CREA: TOPICAL_CREAM | CUTANEOUS | 1 refills | Status: AC

## 2024-01-09 NOTE — Patient Instructions (Addendum)
 Recommend Cerave Anti Itch Cream - apply daily to affected areas of body  Recommend Mild Soap like dove  Continue using clobetasol cream apply topically to affected area twice daily for 1 week, then start using cream once daily for 2 weeks then stop cream  Avoid applying to face, groin, and axilla. Use as directed. Long-term use can cause thinning of the skin.  Topical steroids (such as triamcinolone, fluocinolone, fluocinonide, mometasone, clobetasol, halobetasol, betamethasone, hydrocortisone) can cause thinning and lightening of the skin if they are used for too long in the same area. Your physician has selected the right strength medicine for your problem and area affected on the body. Please use your medication only as directed by your physician to prevent side effects.        After Suture Removal  If your medical team has placed Steri-Strips (white adhesive strips covering the surgical site to provide extra support): Keep the area dry until they fall off.  Do not peel them off. Just let them fall off on their own.  If the edges peel up, you can trim them with scissors.   If your team has not placed Steri-Strips: Wash the area daily with soap and water. Then coat the incision site with plain Vaseline and cover with a bandage. Do this daily for 5 days after the sutures are removed. After that, no additional wound care is generally needed.  However, if you would like to help fade the scar, you can apply a silicone scar cream, gel or sheet every night. The scar will remodel for one year after the procedure. If a skin cancer was removed, be sure to keep your appointment with your dermatologist for follow-up and let your dermatology team know if you have any new or changing spots between visits.    Please call our office at [phone] for any questions or concerns.   Seborrheic Keratosis  What causes seborrheic keratoses? Seborrheic keratoses are harmless, common skin growths  that first appear during adult life.  As time goes by, more growths appear.  Some people may develop a large number of them.  Seborrheic keratoses appear on both covered and uncovered body parts.  They are not caused by sunlight.  The tendency to develop seborrheic keratoses can be inherited.  They vary in color from skin-colored to gray, brown, or even black.  They can be either smooth or have a rough, warty surface.   Seborrheic keratoses are superficial and look as if they were stuck on the skin.  Under the microscope this type of keratosis looks like layers upon layers of skin.  That is why at times the top layer may seem to fall off, but the rest of the growth remains and re-grows.    Treatment Seborrheic keratoses do not need to be treated, but can easily be removed in the office.  Seborrheic keratoses often cause symptoms when they rub on clothing or jewelry.  Lesions can be in the way of shaving.  If they become inflamed, they can cause itching, soreness, or burning.  Removal of a seborrheic keratosis can be accomplished by freezing, burning, or surgery. If any spot bleeds, scabs, or grows rapidly, please return to have it checked, as these can be an indication of a skin cancer.    Gentle Skin Care Guide  1. Bathe no more than once a day.  2. Avoid bathing in hot water  3. Use a mild soap like Dove, Vanicream, Cetaphil, CeraVe. Can use Lever 2000 or  Cetaphil antibacterial soap  4. Use soap only where you need it. On most days, use it under your arms, between your legs, and on your feet. Let the water rinse other areas unless visibly dirty.  5. When you get out of the bath/shower, use a towel to gently blot your skin dry, don't rub it.  6. While your skin is still a little damp, apply a moisturizing cream such as Vanicream, CeraVe, Cetaphil, Eucerin, Sarna lotion or plain Vaseline Jelly. For hands apply Neutrogena [place] Hand Cream or Excipial Hand Cream.  7. Reapply moisturizer any  time you start to itch or feel dry.  8. Sometimes using free and clear laundry detergents can be helpful. Fabric softener sheets should be avoided. Downy Free & Gentle liquid, or any liquid fabric softener that is free of dyes and perfumes, it acceptable to use  9. If your doctor has given you prescription creams you may apply moisturizers over them      Due to recent changes in healthcare laws, you may see results of your pathology and/or laboratory studies on MyChart before the doctors have had a chance to review them. We understand that in some cases there may be results that are confusing or concerning to you. Please understand that not all results are received at the same time and often the doctors may need to interpret multiple results in order to provide you with the best plan of care or course of treatment. Therefore, we ask that you please give [place] 2 business days to thoroughly review all your results before contacting the office for clarification. Should we see a critical lab result, you will be contacted sooner.   If You Need Anything After Your Visit  If you have any questions or concerns for your doctor, please call our main line at [phone] and press option 4 to reach your doctor's medical assistant. If no one answers, please leave a voicemail as directed and we will return your call as soon as possible. Messages left after 4 pm will be answered the following business day.   You may also send [place] a message via MyChart. We typically respond to MyChart messages within 1-2 business days.  For prescription refills, please ask your pharmacy to contact our office. Our fax number is [phone].  If you have an urgent issue when the clinic is closed that cannot wait until the next business day, you can page your doctor at the number below.    Please note that while we do our best to be available for urgent issues outside of office hours, we are not available 24/7.   If you have an  urgent issue and are unable to reach [place], you may choose to seek medical care at your doctor's office, retail clinic, urgent care center, or emergency room.  If you have a medical emergency, please immediately call 911 or go to the emergency department.  Pager Numbers  - Dr. Gwen Pounds: [phone]  - Dr. Roseanne Reno: [phone]  - Dr. Katrinka Blazing: [phone]   In the event of inclement weather, please call our main line at [phone] for an update on the status of any delays or closures.  Dermatology Medication Tips: Please keep the boxes that topical medications come in in order to help keep track of the instructions about where and how to use these. Pharmacies typically print the medication instructions only on the boxes and not directly on the medication tubes.   If your medication is too expensive, please contact our  office at [phone] option 4 or send [place] a message through MyChart.   We are unable to tell what your co-pay for medications will be in advance as this is different depending on your insurance coverage. However, we may be able to find a substitute medication at lower cost or fill out paperwork to get insurance to cover a needed medication.   If a prior authorization is required to get your medication covered by your insurance company, please allow [place] 1-2 business days to complete this process.  Drug prices often vary depending on where the prescription is filled and some pharmacies may offer cheaper prices.  The website www.goodrx.com contains coupons for medications through different pharmacies. The prices here do not account for what the cost may be with help from insurance (it may be cheaper with your insurance), but the website can give you the price if you did not use any insurance.  - You can print the associated coupon and take it with your prescription to the pharmacy.  - You may also stop by our office during regular business hours and pick up a GoodRx coupon card.   - If you need your prescription sent electronically to a different pharmacy, notify our office through [facility] or by phone at [phone] option 4.     Si Usted Necesita Algo Despus de Su Visita  Tambin puede enviarnos un mensaje a travs de Clinical cytogeneticist. Por lo general respondemos a los mensajes de MyChart en el transcurso de 1 a 2 das hbiles.  Para renovar recetas, por favor pida a su farmacia que se ponga en contacto con nuestra oficina. Annie Sable de fax es [place] [phone].  Si tiene un asunto urgente cuando la clnica est cerrada y que no puede esperar hasta el siguiente da hbil, puede llamar/localizar a su doctor(a) al nmero que aparece a continuacin.   Por favor, tenga en cuenta que aunque hacemos todo lo posible para estar disponibles para asuntos urgentes fuera del horario de [place], no estamos disponibles las 24 horas del da, los 7 [address]  [address] de la [place].   Si tiene un problema urgente y no puede comunicarse con nosotros, puede optar por buscar atencin mdica  en el consultorio de su doctor(a), en una clnica privada, en un centro de atencin urgente o en una sala de emergencias.  Si tiene Engineer, drilling, por favor llame inmediatamente al 911 o vaya a la sala de emergencias.  Nmeros de bper  - Dr. Gwen Pounds: [phone]  - Dra. Roseanne Reno: [phone]  - Dr. Katrinka Blazing: [phone]   En caso de inclemencias del tiempo, por favor llame a Lacy Duverney principal al [phone] para una actualizacin sobre el [place] de cualquier retraso o cierre.  Consejos para la medicacin en dermatologa: Por favor, guarde las cajas en las que vienen los medicamentos de uso tpico para ayudarle a seguir las instrucciones sobre dnde y cmo usarlos. Las farmacias generalmente imprimen las instrucciones del medicamento slo en las cajas y no directamente en los tubos del [place].   Si su medicamento es muy caro, por favor, pngase en contacto con Rolm Gala  llamando al [phone] y presione la opcin 4 o envenos un mensaje a travs de Clinical cytogeneticist.   No podemos decirle cul ser su copago por los medicamentos por adelantado ya que esto es diferente dependiendo de la cobertura de su seguro. Sin embargo, es posible que podamos encontrar un medicamento sustituto a Audiological scientist un formulario para que el seguro cubra el medicamento  que se considera necesario.   Si se requiere una autorizacin previa para que su compaa de seguros [place] su medicamento, por favor permtanos de 1 a 2 das hbiles para completar [address]  Los precios de los medicamentos varan con frecuencia dependiendo del Environmental consultant de dnde se surte la receta y alguna farmacias pueden ofrecer precios ms baratos.  El sitio web www.goodrx.com tiene cupones para medicamentos de Health and safety inspector. Los precios aqu no tienen en cuenta lo que podra costar con la ayuda del seguro (puede ser ms barato con su seguro), pero el sitio web puede darle el precio si no utiliz Tourist information centre manager.  - Puede imprimir el cupn correspondiente y llevarlo con su receta a la farmacia.  - Tambin puede pasar por nuestra oficina durante el horario de atencin regular y Education officer, museum una tarjeta de cupones de GoodRx.  - Si necesita que su receta se enve electrnicamente a una farmacia diferente, informe a nuestra oficina a travs de MyChart de [place] o por telfono llamando al [phone] y presione la opcin 4.

## 2024-01-09 NOTE — Progress Notes (Signed)
   Follow-Up Visit   Subjective  Pam Cruz is a 89 y.o. 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  Objective  Well appearing patient in no apparent distress; mood and affect are within normal limits.  Areas Examined: b/l legs and thighs, b/l arms, b/l hands   Relevant physical exam findings are noted in the Assessment and Plan.    Assessment & Plan   SEBORRHEIC KERATOSIS At arms  - Stuck-on, waxy, tan-brown papules - Benign-appearing - Discussed benign etiology and prognosis. - Observe - Call for any changes  Pigmented Purpura at b/l lower extremities  Exam: Small pink violaceous macules thighs some with mild scale on calves.  Some fading in color when compared to baseline photos.  Chronic and persistent condition with duration or expected duration over one year. Condition is improving with treatment but not currently at goal.  Itching has improved on clobetasol cream  Treatment Plan: Benign condition. Discussed diagnosis. Will recheck in 4 weeks  Continue Clobetasol cream Apply to itchy rash BID for 1 week then, decrease to qd for 2 weeks then stop. dsp 45g 1 rf. Caution skin atrophy with long-term use.   Avoid applying to face, groin, and axilla. Use as directed. Long-term use can cause thinning of the skin.  Topical steroids (such as triamcinolone, fluocinolone, fluocinonide, mometasone, clobetasol, halobetasol, betamethasone, hydrocortisone) can cause thinning and lightening of the skin if they are used for too long in the same area. Your physician has selected the right strength medicine for your problem and area affected on the body.  Please use your medication only as directed by your physician to prevent side effects.    Recommend gentle skin care.  May use CeraVe anti itch moisturizer as needed for itch  RASH   Related Medications clobetasol cream (TEMOVATE) 0.05 % Apply to itchy rash twice daily until improved. Avoid applying to face, groin, and axilla. Use as directed. Long-term use can cause thinning of the skin. Encounter for Removal of Sutures - Incision site is clean, dry and intact. - Wound cleansed, sutures removed, wound cleansed and steri strips applied.  - Discussed pathology results showing Skin, left anterior thigh :      CONSISTENT WITH PIGMENTED PURPURIC DERMATOSIS, SEE DESCRIPTION 2. Skin, left upper calf :      CONSISTENT WITH PIGMENTED PURPURIC DERMATOSIS, SEE DESCRIPTION - Patient advised to keep steri-strips dry until they fall off. - Scars remodel for a full year. - Once steri-strips fall off, patient can apply over-the-counter silicone scar cream once to twice a day to help with scar remodeling if desired. - Patient advised to call with any concerns or if they notice any new or changing lesions.  Return for 3 - 4 week rash follow up.  I, Asher Muir, CMA, am acting as scribe for Willeen Niece, MD.   Documentation: I have reviewed the above documentation for accuracy and completeness, and I agree with the above.  Willeen Niece, MD

## 2024-01-30 DIAGNOSIS — H35372 Puckering of macula, left eye: Secondary | ICD-10-CM | POA: Diagnosis not present

## 2024-01-30 DIAGNOSIS — Z961 Presence of intraocular lens: Secondary | ICD-10-CM | POA: Diagnosis not present

## 2024-01-30 DIAGNOSIS — H353210 Exudative age-related macular degeneration, right eye, stage unspecified: Secondary | ICD-10-CM | POA: Diagnosis not present

## 2024-02-03 ENCOUNTER — Ambulatory Visit: Admitting: Dermatology

## 2024-02-03 ENCOUNTER — Encounter: Payer: Self-pay | Admitting: Dermatology

## 2024-02-03 DIAGNOSIS — L821 Other seborrheic keratosis: Secondary | ICD-10-CM

## 2024-02-03 DIAGNOSIS — L817 Pigmented purpuric dermatosis: Secondary | ICD-10-CM | POA: Diagnosis not present

## 2024-02-03 DIAGNOSIS — D692 Other nonthrombocytopenic purpura: Secondary | ICD-10-CM

## 2024-02-03 NOTE — Progress Notes (Signed)
   Follow-Up Visit   Subjective  Pam Cruz is a 89 y.o. female who presents for the following: f/u rash bx proven Pigmented purpuric dermatosis bil legs, cleared per patient, used Clobetasol  cream until rash cleared, then stopped.  No longer itching   The following portions of the chart were reviewed this encounter and updated as appropriate: medications, allergies, medical history  Review of Systems:  No other skin or systemic complaints except as noted in HPI or Assessment and Plan.  Objective  Well appearing patient in no apparent distress; mood and affect are within normal limits.   A focused examination was performed of the following areas: Legs, arms  Relevant exam findings are noted in the Assessment and Plan.    Assessment & Plan   PIGMENTED PURPURIC DERMATOSIS Bx proven  Exam: bil legs clear, including thighs  Treatment Plan: Resolved Cont moisturizer every day Clobetasol  bid x 2 weeks, then every day x 2 weeks prn flares  SEBORRHEIC KERATOSIS - Stuck-on, waxy, tan-brown papules arms - Benign-appearing - Discussed benign etiology and prognosis. - Observe - Call for any changes    Return if symptoms worsen or fail to improve.  I, Rollie Clipper, RMA, am acting as scribe for Artemio Larry, MD .   Documentation: I have reviewed the above documentation for accuracy and completeness, and I agree with the above.  Artemio Larry, MD

## 2024-02-03 NOTE — Patient Instructions (Signed)

## 2024-02-10 ENCOUNTER — Other Ambulatory Visit: Payer: Self-pay | Admitting: Nurse Practitioner

## 2024-02-10 DIAGNOSIS — F411 Generalized anxiety disorder: Secondary | ICD-10-CM

## 2024-02-11 NOTE — Telephone Encounter (Signed)
Next appt: 7/25

## 2024-02-13 ENCOUNTER — Ambulatory Visit (INDEPENDENT_AMBULATORY_CARE_PROVIDER_SITE_OTHER): Admitting: Nurse Practitioner

## 2024-02-13 ENCOUNTER — Encounter: Payer: Self-pay | Admitting: Nurse Practitioner

## 2024-02-13 VITALS — BP 120/76 | HR 60 | Temp 98.8°F | Resp 16 | Ht <= 58 in | Wt 78.0 lb

## 2024-02-13 DIAGNOSIS — F411 Generalized anxiety disorder: Secondary | ICD-10-CM

## 2024-02-13 DIAGNOSIS — E782 Mixed hyperlipidemia: Secondary | ICD-10-CM | POA: Diagnosis not present

## 2024-02-13 DIAGNOSIS — E871 Hypo-osmolality and hyponatremia: Secondary | ICD-10-CM

## 2024-02-13 DIAGNOSIS — E039 Hypothyroidism, unspecified: Secondary | ICD-10-CM

## 2024-02-13 DIAGNOSIS — E559 Vitamin D deficiency, unspecified: Secondary | ICD-10-CM

## 2024-02-13 DIAGNOSIS — I10 Essential (primary) hypertension: Secondary | ICD-10-CM | POA: Diagnosis not present

## 2024-02-13 MED ORDER — CLONAZEPAM 0.5 MG PO TABS: 0.7500 mg | ORAL_TABLET | Freq: Every day | ORAL | 0 refills | Status: DC

## 2024-02-13 NOTE — Progress Notes (Signed)
 [facility] [address] [place], [place] [address]  Internal MEDICINE  Office Visit Note  Patient Name: Pam Cruz  [id number]  [id number]  Date of Service: 02/13/2024  Chief Complaint  Patient presents with   Gastroesophageal Reflux   Hypertension   Follow-up    HPI Pam Cruz presents for a follow-up visit for anxiety, hypertension, hypothyroidism, and high cholesterol.  Anxiety -- takes clonazepam  regularly as needed. Due for refills. Current dose is effective.  Hypertension -- controlled with amlodipine  and losartan   Hypothyroidism -- takes levothyroxine  daily High cholesterol -- not currently on any cholesterol medication.     Current Medication: Outpatient Encounter Medications as of 02/13/2024  Medication Sig   acetaminophen  (TYLENOL ) 500 MG tablet Take 500 mg by mouth every 6 (six) hours as needed (pain).    amLODipine  (NORVASC ) 5 MG tablet TAKE 1 TABLET BY MOUTH DAILY   bimatoprost (LUMIGAN) 0.01 % SOLN INSTILL 1 DROP IN LEFT EYE AT BEDTIME   clobetasol  cream (TEMOVATE ) 0.05 % Apply to itchy rash twice daily until improved. Avoid applying to face, groin, and axilla. Use as directed. Long-term use can cause thinning of the skin.   cloNIDine  (CATAPRES ) 0.1 MG tablet TAKE 1 TABLET BY MOUTH TWICE  DAILY MAY TAKE EXTRA TABLET IF  NEEDED , MAXIMUM 3 TABLETS A DAY   desipramine  (NORPRAMIN ) 25 MG tablet Take 1 tablet (25 mg total) by mouth daily.   Fluticasone -Umeclidin-Vilant (TRELEGY ELLIPTA ) 100-62.5-25 MCG/ACT AEPB Inhale 1 puff into the lungs daily.   hydrALAZINE  (APRESOLINE ) 25 MG tablet Take 1 tablet (25 mg total) by mouth 3 (three) times daily.   levothyroxine  (SYNTHROID ) 50 MCG tablet Take 1 tablet (50 mcg total) by mouth daily.   losartan  (COZAAR ) 50 MG tablet Take 1 tablet (50 mg total) by mouth 2 (two) times daily.   Multiple Vitamins-Minerals (PRESERVISION AREDS 2+MULTI VIT) CAPS Take 1 capsule by mouth in the morning and at bedtime.   omeprazole  (PRILOSEC) 20 MG capsule Take 20 mg by mouth 2 (two) times daily before a meal.    Polyethyl Glycol-Propyl Glycol (SYSTANE) 0.4-0.3 % SOLN Apply to eye.   Polyethylene Glycol 400 (BLINK TEARS) 0.25 % SOLN Apply to eye.   Psyllium (METAMUCIL) 28.3 % POWD Take by mouth.   tacrolimus  (PROTOPIC ) 0.1 % ointment Apply to itchy red patches QD PRN.   triamcinolone  cream (KENALOG ) 0.1 % Apply once a day to aa legs on Friday, Saturday and Sundays. Avoid applying to face, groin, and axilla. Use as directed. Long-term use can cause thinning of the skin.   [DISCONTINUED] clonazePAM  (KLONOPIN ) 0.5 MG tablet Take 1.5 tablets (0.75 mg total) by mouth at bedtime.   clonazePAM  (KLONOPIN ) 0.5 MG tablet Take 1.5 tablets (0.75 mg total) by mouth at bedtime.   No facility-administered encounter medications on file as of 02/13/2024.    Surgical History: Past Surgical History:  Procedure Laterality Date   COLONOSCOPY     cysto     LAPAROSCOPY     TOTAL HIP ARTHROPLASTY Right 08/17/2019   Procedure: TOTAL HIP ARTHROPLASTY;  Surgeon: Arlyne Lame, MD;  Location: [facility] ORS;  Service: Orthopedics;  Laterality: Right;    Medical History: Past Medical History:  Diagnosis Date   Anxiety    Arthritis    COPD (chronic obstructive pulmonary disease) (HCC)    Dyspnea    Gastritis    GERD (gastroesophageal reflux disease)    Hypertension    Osteoporosis     Family History: Family History  Problem Relation Age of Onset   Hypertension Mother    Coronary artery disease Father    Heart disease Father    Breast cancer Neg Hx     Social History   Socioeconomic History   Marital status: Single    Spouse name: Not on file   Number of children: Not on file   Years of education: Not on file   Highest education level: Not on file  Occupational History   Not on file  Tobacco Use   Smoking status: Former    Current packs/day: 0.00    Types: Cigarettes    Quit date: 02/20/2001    Years since quitting: 22.9    Smokeless tobacco: Never   Tobacco comments:    quit 17 years ago  Vaping Use   Vaping status: Never Used  Substance and Sexual Activity   Alcohol use: Not Currently    Alcohol/week: 2.0 standard drinks of alcohol    Types: 2 Glasses of wine per week    Comment: very rarely   Drug use: Never   Sexual activity: Not on file  Other Topics Concern   Not on file  Social History Narrative   Not on file   Social Drivers of Health   Financial Resource Strain: High Risk (11/29/2023)   Received from [facility]   Overall Financial Resource Strain (CARDIA)    Difficulty of Paying Living Expenses: Hard  Food Insecurity: No Food Insecurity (11/29/2023)   Received from [facility]   Hunger Vital Sign    Worried About Running Out of Food in the Last Year: Never true    Ran Out of Food in the Last Year: Never true  Transportation Needs: No Transportation Needs (11/29/2023)   Received from [facility]    In the past 12 months, has lack of transportation kept you from medical appointments or from getting medications?: No    Lack of Transportation (Non-Medical): No  Physical Activity: Not on file  Stress: Not on file  Social Connections: Unknown (02/12/2022)   [facility] from [facility]   Social Network    Social Network: Not on file  Intimate Partner Violence: Unknown (02/09/2022)   Received from [facility]   HITS    Physically Hurt: Not on file    Insult or Talk Down To: Not on file    Threaten Physical Harm: Not on file    Scream or Curse: Not on file      Review of Systems  Constitutional:  Negative for chills, fatigue and unexpected weight change.  HENT:  Negative for congestion, postnasal drip, rhinorrhea, sneezing and sore throat.   Eyes:  Negative for redness.  Respiratory:  Negative for cough, chest tightness, shortness of breath and wheezing.   Cardiovascular:  Negative.  Negative for chest pain and palpitations.  Gastrointestinal:  Positive for constipation. Negative for abdominal pain, diarrhea, nausea and vomiting.  Genitourinary:  Negative for dysuria and frequency.  Musculoskeletal:  Negative for arthralgias, back pain, joint swelling and neck pain.  Skin:  Negative for rash.  Neurological: Negative.  Negative for tremors and numbness.  Hematological:  Negative for adenopathy. Does not bruise/bleed easily.  Psychiatric/Behavioral:  Negative for behavioral problems (Depression), self-injury, sleep disturbance and suicidal ideas. The patient is nervous/anxious.     Vital Signs: BP 120/76   Pulse 60   Temp 98.8 F (37.1 C)   Resp 16  Ht 4\' 8"  (1.422 m)   Wt 78 lb (35.4 kg)   SpO2 93%   BMI 17.49 kg/m    Physical Exam Vitals reviewed.  Constitutional:      General: She is not in acute distress.    Appearance: Normal appearance. She is underweight. She is not ill-appearing.  HENT:     Head: Normocephalic and atraumatic.  Eyes:     Pupils: Pupils are equal, round, and reactive to light.  Cardiovascular:     Rate and Rhythm: Normal rate and regular rhythm.  Pulmonary:     Effort: Pulmonary effort is normal. No respiratory distress.  Neurological:     Mental Status: She is alert and oriented to person, place, and time.  Psychiatric:        Mood and Affect: Mood normal.        Behavior: Behavior normal.        Assessment/Plan: 1. Essential hypertension (Primary) Stable, continue amlodipine  and losartan  as prescribed. Routine labs ordered.  - CBC with Differential/Platelet - CMP14+EGFR - Lipid Profile  2. Mixed hyperlipidemia Routine labs ordered  - CBC with Differential/Platelet - CMP14+EGFR - Lipid Profile - TSH + free T4  3. Hypothyroidism, unspecified type Routine labs ordered. Continue levothyroxine  as prescribed.  - CBC with Differential/Platelet - CMP14+EGFR - Lipid Profile - TSH + free T4  4.  Hyponatremia Routine lab ordered  - CMP14+EGFR  5. GAD (generalized anxiety disorder) Continue clonazepam  as needed as prescribed. Follow up in 3 months for additional refills  - clonazePAM  (KLONOPIN ) 0.5 MG tablet; Take 1.5 tablets (0.75 mg total) by mouth at bedtime.  Dispense: 135 tablet; Refill: 0   General Counseling: Pam Cruz understanding of the findings of todays visit and agrees with plan of treatment. I have discussed any further diagnostic evaluation that may be needed or ordered today. We also reviewed her medications today. she has been encouraged to call the office with any questions or concerns that should arise related to todays visit.    No orders of the defined types were placed in this encounter.   Meds ordered this encounter  Medications   clonazePAM  (KLONOPIN ) 0.5 MG tablet    Sig: Take 1.5 tablets (0.75 mg total) by mouth at bedtime.    Dispense:  135 tablet    Refill:  0    Please send this script asap, patient is almost out.    Return in about 3 months (around 05/11/2024) for F/U, anxiety med refill, Pam Cruz PCP.   Total time spent:30 Minutes Time spent includes review of chart, medications, test results, and follow up plan with the patient.   [place] Controlled Substance Database was reviewed by me.  This patient was seen by Pam Pons, FNP-C in collaboration with Dr. Verneta Cruz as a part of collaborative care agreement.   Pam Yurchak R. Bobbi Burow, MSN, FNP-C Internal medicine

## 2024-02-21 ENCOUNTER — Encounter: Payer: Self-pay | Admitting: Nurse Practitioner

## 2024-03-04 DIAGNOSIS — M79674 Pain in right toe(s): Secondary | ICD-10-CM | POA: Diagnosis not present

## 2024-03-04 DIAGNOSIS — L84 Corns and callosities: Secondary | ICD-10-CM | POA: Diagnosis not present

## 2024-03-04 DIAGNOSIS — M79675 Pain in left toe(s): Secondary | ICD-10-CM | POA: Diagnosis not present

## 2024-03-04 DIAGNOSIS — B351 Tinea unguium: Secondary | ICD-10-CM | POA: Diagnosis not present

## 2024-03-14 ENCOUNTER — Encounter: Payer: Self-pay | Admitting: Nurse Practitioner

## 2024-03-24 ENCOUNTER — Other Ambulatory Visit: Payer: Self-pay | Admitting: Nurse Practitioner

## 2024-03-24 DIAGNOSIS — I10 Essential (primary) hypertension: Secondary | ICD-10-CM

## 2024-03-25 DIAGNOSIS — H35051 Retinal neovascularization, unspecified, right eye: Secondary | ICD-10-CM | POA: Diagnosis not present

## 2024-03-25 DIAGNOSIS — H353211 Exudative age-related macular degeneration, right eye, with active choroidal neovascularization: Secondary | ICD-10-CM | POA: Diagnosis not present

## 2024-03-25 DIAGNOSIS — H54511A Low vision right eye category 1, normal vision left eye: Secondary | ICD-10-CM | POA: Diagnosis not present

## 2024-03-25 DIAGNOSIS — Z961 Presence of intraocular lens: Secondary | ICD-10-CM | POA: Diagnosis not present

## 2024-03-25 DIAGNOSIS — H40053 Ocular hypertension, bilateral: Secondary | ICD-10-CM | POA: Diagnosis not present

## 2024-04-23 ENCOUNTER — Telehealth: Payer: Self-pay | Admitting: Nurse Practitioner

## 2024-04-23 DIAGNOSIS — H40052 Ocular hypertension, left eye: Secondary | ICD-10-CM | POA: Diagnosis not present

## 2024-04-23 DIAGNOSIS — Z961 Presence of intraocular lens: Secondary | ICD-10-CM | POA: Diagnosis not present

## 2024-04-23 DIAGNOSIS — H35372 Puckering of macula, left eye: Secondary | ICD-10-CM | POA: Diagnosis not present

## 2024-04-23 DIAGNOSIS — H353212 Exudative age-related macular degeneration, right eye, with inactive choroidal neovascularization: Secondary | ICD-10-CM | POA: Diagnosis not present

## 2024-04-23 NOTE — Telephone Encounter (Signed)
 Left vm to confirm 04/30/24 appointment-Toni

## 2024-04-29 DIAGNOSIS — L299 Pruritus, unspecified: Secondary | ICD-10-CM | POA: Diagnosis not present

## 2024-04-29 DIAGNOSIS — H6983 Other specified disorders of Eustachian tube, bilateral: Secondary | ICD-10-CM | POA: Diagnosis not present

## 2024-04-29 DIAGNOSIS — L723 Sebaceous cyst: Secondary | ICD-10-CM | POA: Diagnosis not present

## 2024-04-30 ENCOUNTER — Encounter: Payer: Self-pay | Admitting: Nurse Practitioner

## 2024-04-30 ENCOUNTER — Telehealth: Payer: Self-pay | Admitting: Nurse Practitioner

## 2024-04-30 ENCOUNTER — Ambulatory Visit (INDEPENDENT_AMBULATORY_CARE_PROVIDER_SITE_OTHER): Payer: 59 | Admitting: Nurse Practitioner

## 2024-04-30 VITALS — BP 120/74 | HR 60 | Temp 97.4°F | Resp 16 | Ht <= 58 in | Wt 75.4 lb

## 2024-04-30 DIAGNOSIS — Z0000 Encounter for general adult medical examination without abnormal findings: Secondary | ICD-10-CM

## 2024-04-30 DIAGNOSIS — I10 Essential (primary) hypertension: Secondary | ICD-10-CM

## 2024-04-30 DIAGNOSIS — F411 Generalized anxiety disorder: Secondary | ICD-10-CM | POA: Diagnosis not present

## 2024-04-30 DIAGNOSIS — Z1231 Encounter for screening mammogram for malignant neoplasm of breast: Secondary | ICD-10-CM | POA: Diagnosis not present

## 2024-04-30 MED ORDER — AMLODIPINE BESYLATE 5 MG PO TABS: 5.0000 mg | ORAL_TABLET | Freq: Every day | ORAL | 2 refills | Status: AC

## 2024-04-30 MED ORDER — CLONAZEPAM 0.5 MG PO TABS: 0.7500 mg | ORAL_TABLET | Freq: Every day | ORAL | 0 refills | Status: DC

## 2024-04-30 NOTE — Telephone Encounter (Signed)
 Pt lvm to r/s today's wellness visit because she has not had labs done. Called patient back, lvm to come to appointment per Alyssa-Toni

## 2024-04-30 NOTE — Progress Notes (Signed)
 [facility] [address] [place], [place] [address]  Internal MEDICINE  Office Visit Note  Patient Name: Pam Cruz  [id number]  [id number]  Date of Service: 04/30/2024  Chief Complaint  Patient presents with   Gastroesophageal Reflux   Hypertension   Medicare Wellness    HPI Smrithi presents for a medicare annual wellness visit.  Well-appearing 89 y.o. female with hypertension, COPD, hypothyroidism, and anxiety.  Routine CRC screening: discontinued, aged out.  Routine mammogram: overdue for mammogram Labs: due for labs, patient reminded to have labs drawn.  New or worsening pain: none  Other concerns: none      04/30/2024   11:09 AM 04/30/2023   11:40 AM 03/27/2022   11:04 AM  MMSE - Mini Mental State Exam  Orientation to time 5 5 5   Orientation to Place 5 5 5   Registration 3 3 3   Attention/ Calculation 5 5 5   Recall 3 3 3   Language- name 2 objects 2 2 2   Language- repeat 1 1 1   Language- follow 3 step command 3 3 3   Language- read & follow direction 1 1 1   Write a sentence 1 1 1   Copy design 1 1 1   Total score 30 30 30     Functional Status Survey: Is the patient deaf or have difficulty hearing?: No Does the patient have difficulty seeing, even when wearing glasses/contacts?: Yes Does the patient have difficulty concentrating, remembering, or making decisions?: No Does the patient have difficulty walking or climbing stairs?: No Does the patient have difficulty dressing or bathing?: No Does the patient have difficulty doing errands alone such as visiting a doctor's office or shopping?: No     09/19/2021   12:00 PM 03/27/2022   11:02 AM 12/24/2022    1:36 PM 04/30/2023   11:36 AM 04/30/2024   11:07 AM  Fall Risk  Falls in the past year? 0 1 0 0 0  Was there an injury with Fall?  0  0 0  Fall Risk Category Calculator  1  0 0  Fall Risk Category (Retired)  Low      (RETIRED) Patient Fall Risk Level Low fall risk       Patient at Risk for Falls  Due to No Fall Risks   No Fall Risks No Fall Risks  Fall risk Follow up Falls evaluation completed    Falls evaluation completed Falls evaluation completed     Data saved with a previous flowsheet row definition       04/30/2024   11:07 AM  Depression screen PHQ 2/9  Decreased Interest 0  Down, Depressed, Hopeless 0  PHQ - 2 Score 0        No data to display            Current Medication: Outpatient Encounter Medications as of 04/30/2024  Medication Sig   acetaminophen  (TYLENOL ) 500 MG tablet Take 500 mg by mouth every 6 (six) hours as needed (pain).    amLODipine  (NORVASC ) 5 MG tablet Take 1 tablet (5 mg total) by mouth daily.   bimatoprost (LUMIGAN) 0.01 % SOLN INSTILL 1 DROP IN LEFT EYE AT BEDTIME   clobetasol  cream (TEMOVATE ) 0.05 % Apply to itchy rash twice daily until improved. Avoid applying to face, groin, and axilla. Use as directed. Long-term use can cause thinning of the skin.   clonazePAM  (KLONOPIN ) 0.5 MG tablet Take 1.5 tablets (0.75 mg total) by mouth at bedtime.   cloNIDine  (CATAPRES ) 0.1 MG tablet TAKE  1 TABLET BY MOUTH TWICE  DAILY MAY TAKE EXTRA TABLET IF  NEEDED , MAXIMUM 3 TABLETS A DAY   desipramine  (NORPRAMIN ) 25 MG tablet Take 1 tablet (25 mg total) by mouth daily.   Fluticasone -Umeclidin-Vilant (TRELEGY ELLIPTA ) 100-62.5-25 MCG/ACT AEPB Inhale 1 puff into the lungs daily.   hydrALAZINE  (APRESOLINE ) 25 MG tablet Take 1 tablet (25 mg total) by mouth 3 (three) times daily.   levothyroxine  (SYNTHROID ) 50 MCG tablet Take 1 tablet (50 mcg total) by mouth daily.   losartan  (COZAAR ) 50 MG tablet TAKE 1 TABLET BY MOUTH TWICE  DAILY   Multiple Vitamins-Minerals (PRESERVISION AREDS 2+MULTI VIT) CAPS Take 1 capsule by mouth in the morning and at bedtime.   omeprazole (PRILOSEC) 20 MG capsule Take 20 mg by mouth 2 (two) times daily before a meal.    Polyethyl Glycol-Propyl Glycol (SYSTANE) 0.4-0.3 % SOLN Apply to eye.   Polyethylene Glycol 400 (BLINK TEARS) 0.25 %  SOLN Apply to eye.   Psyllium (METAMUCIL) 28.3 % POWD Take by mouth.   tacrolimus  (PROTOPIC ) 0.1 % ointment Apply to itchy red patches QD PRN.   triamcinolone  cream (KENALOG ) 0.1 % Apply once a day to aa legs on Friday, Saturday and Sundays. Avoid applying to face, groin, and axilla. Use as directed. Long-term use can cause thinning of the skin.   [DISCONTINUED] amLODipine  (NORVASC ) 5 MG tablet TAKE 1 TABLET BY MOUTH DAILY   [DISCONTINUED] clonazePAM  (KLONOPIN ) 0.5 MG tablet Take 1.5 tablets (0.75 mg total) by mouth at bedtime.   No facility-administered encounter medications on file as of 04/30/2024.    Surgical History: Past Surgical History:  Procedure Laterality Date   COLONOSCOPY     cysto     LAPAROSCOPY     TOTAL HIP ARTHROPLASTY Right 08/17/2019   Procedure: TOTAL HIP ARTHROPLASTY;  Surgeon: Mardee Lynwood SQUIBB, MD;  Location: [facility] ORS;  Service: Orthopedics;  Laterality: Right;    Medical History: Past Medical History:  Diagnosis Date   Anxiety    Arthritis    COPD (chronic obstructive pulmonary disease) (HCC)    Dyspnea    Gastritis    GERD (gastroesophageal reflux disease)    Hypertension    Osteoporosis     Family History: Family History  Problem Relation Age of Onset   Hypertension Mother    Coronary artery disease Father    Heart disease Father    Breast cancer Neg Hx     Social History   Socioeconomic History   Marital status: Single    Spouse name: Not on file   Number of children: Not on file   Years of education: Not on file   Highest education level: Not on file  Occupational History   Not on file  Tobacco Use   Smoking status: Former    Current packs/day: 0.00    Types: Cigarettes    Quit date: 02/20/2001    Years since quitting: 23.2   Smokeless tobacco: Never   Tobacco comments:    quit 17 years ago  Vaping Use   Vaping status: Never Used  Substance and Sexual Activity   Alcohol use: Not Currently    Alcohol/week: 2.0 standard drinks of  alcohol    Types: 2 Glasses of wine per week    Comment: very rarely   Drug use: Never   Sexual activity: Not on file  Other Topics Concern   Not on file  Social History Narrative   Not on file   Social Drivers of Health  Financial Resource Strain: High Risk (11/29/2023)   Received [facility]   Overall Financial Resource Strain (CARDIA)    Difficulty of Paying Living Expenses: Hard  Food Insecurity: No Food Insecurity (11/29/2023)   Received from [facility]   Hunger Vital Sign    Within the past 12 months, you worried that your food would run out before you got the money to buy more.: Never true    Within the past 12 months, the food you bought just didn't last and you didn't have money to get more.: Never true  Transportation Needs: No Transportation Needs (11/29/2023)   Received from [facility] - Transportation    In the past 12 months, has lack of transportation kept you from medical appointments or from getting medications?: No    Lack of Transportation (Non-Medical): No  Physical Activity: Not on file  Stress: Not on file  Social Connections: Unknown (02/12/2022)   Received from [facility]   Social Network    Social Network: Not on file  Intimate Partner Violence: Unknown (02/09/2022)   Received from [facility]   HITS    Physically Hurt: Not on file    Insult or Talk Down To: Not on file    Threaten Physical Harm: Not on file    Scream or Curse: Not on file      Review of Systems  Constitutional:  Negative for chills, fatigue and unexpected weight change.  HENT:  Positive for postnasal drip. Negative for congestion, rhinorrhea, sneezing and sore throat.   Eyes:  Negative for redness.  Respiratory:  Negative for cough, chest tightness and shortness of breath.   Cardiovascular:  Negative for chest pain and palpitations.  Gastrointestinal:  Negative for abdominal pain, constipation, diarrhea,  nausea and vomiting.  Genitourinary:  Negative for dysuria and frequency.  Musculoskeletal:  Negative for arthralgias, back pain, joint swelling and neck pain.  Skin:  Negative for rash.  Neurological: Negative.  Negative for tremors and numbness.  Hematological:  Negative for adenopathy. Does not bruise/bleed easily.  Psychiatric/Behavioral:  Negative for behavioral problems (Depression), sleep disturbance and suicidal ideas. The patient is not nervous/anxious.     Vital Signs: BP 120/74   Pulse 60   Temp (!) 97.4 F (36.3 C)   Resp 16   Ht 4' 8 (1.422 m)   Wt 75 lb 6.4 oz (34.2 kg)   SpO2 97%   BMI 16.90 kg/m    Physical Exam Vitals reviewed.  Constitutional:      General: She is not in acute distress.    Appearance: Normal appearance. She is not ill-appearing.  HENT:     Head: Normocephalic and atraumatic.     Right Ear: Tympanic membrane, ear canal and external ear normal. There is no impacted cerumen.     Left Ear: Tympanic membrane, ear canal and external ear normal. There is no impacted cerumen.     Nose: Nose normal. No congestion or rhinorrhea.     Mouth/Throat:     Mouth: Mucous membranes are moist.     Pharynx: Oropharynx is clear. No oropharyngeal exudate or posterior oropharyngeal erythema.  Eyes:     Extraocular Movements: Extraocular movements intact.     Conjunctiva/sclera: Conjunctivae normal.     Pupils: Pupils are equal, round, and reactive to light.  Neck:     Vascular: No carotid bruit.  Cardiovascular:     Rate and Rhythm: Normal rate and regular rhythm.  Pulses: Normal pulses.     Heart sounds: Normal heart sounds.  Pulmonary:     Effort: Pulmonary effort is normal.     Breath sounds: Normal breath sounds.  Chest:  Breasts:    Breasts are symmetrical.     Right: Normal. No swelling, bleeding, inverted nipple, mass, nipple discharge, skin change or tenderness.     Left: Normal. No swelling, bleeding, inverted nipple, mass, nipple  discharge, skin change or tenderness.  Abdominal:     General: Bowel sounds are normal. There is no distension.     Palpations: Abdomen is soft. There is no mass.     Tenderness: There is no abdominal tenderness. There is no guarding or rebound.     Hernia: No hernia is present.  Musculoskeletal:        General: Normal range of motion.     Cervical back: Normal range of motion.     Right lower leg: No edema.     Left lower leg: No edema.  Lymphadenopathy:     Cervical: No cervical adenopathy.     Upper Body:     Right upper body: No supraclavicular, axillary or pectoral adenopathy.     Left upper body: No supraclavicular, axillary or pectoral adenopathy.  Skin:    General: Skin is warm and dry.     Capillary Refill: Capillary refill takes less than 2 seconds.     Coloration: Skin is not jaundiced.  Neurological:     General: No focal deficit present.     Mental Status: She is alert and oriented to person, place, and time.     Sensory: No sensory deficit.     Motor: No weakness.     Gait: Gait normal.  Psychiatric:        Mood and Affect: Mood normal.        Behavior: Behavior normal.        Thought Content: Thought content normal.        Judgment: Judgment normal.        Assessment/Plan: 1. Encounter for subsequent annual wellness visit (AWV) in Medicare patient (Primary) Age-appropriate preventive screenings and vaccinations discussed. Routine labs for health maintenance previously ordered, patient reminded to have labs done. PHM updated.    2. Essential hypertension Stable, continue amlodipine  as prescribed.  - amLODipine  (NORVASC ) 5 MG tablet; Take 1 tablet (5 mg total) by mouth daily.  Dispense: 100 tablet; Refill: 2  3. Encounter for screening mammogram for malignant neoplasm of breast Routine mammogram ordered.  - MM 3D SCREENING MAMMOGRAM BILATERAL BREAST; Future  4. GAD (generalized anxiety disorder) Continue clonazepam  as prescribed. Follow up in 3 months  for additional refills.  - clonazePAM  (KLONOPIN ) 0.5 MG tablet; Take 1.5 tablets (0.75 mg total) by mouth at bedtime.  Dispense: 135 tablet; Refill: 0     General Counseling: tashunda vandezande understanding of the findings of todays visit and agrees with plan of treatment. I have discussed any further diagnostic evaluation that may be needed or ordered today. We also reviewed her medications today. she has been encouraged to call the office with any questions or concerns that should arise related to todays visit.    Orders Placed This Encounter  Procedures   MM 3D SCREENING MAMMOGRAM BILATERAL BREAST    Meds ordered this encounter  Medications   amLODipine  (NORVASC ) 5 MG tablet    Sig: Take 1 tablet (5 mg total) by mouth daily.    Dispense:  100 tablet    Refill:  2    Please send a replace/new response with 100-Day Supply if appropriate to maximize member benefit. Requesting 1 year supply.   clonazePAM  (KLONOPIN ) 0.5 MG tablet    Sig: Take 1.5 tablets (0.75 mg total) by mouth at bedtime.    Dispense:  135 tablet    Refill:  0    For next fill    Return in about 3 months (around 07/22/2024) for F/U, anxiety med refill, Chao Blazejewski PCP, needs mammo scheduled, prefers afternoons..   Total time spent:30 Minutes Time spent includes review of chart, medications, test results, and follow up plan with the patient.   [place] Controlled Substance Database was reviewed by me.  This patient was seen by Mardy Maxin, FNP-C in collaboration with Dr. Sigrid Bathe as a part of collaborative care agreement.  Llesenia Fogal R. Maxin, MSN, FNP-C Internal medicine

## 2024-04-30 NOTE — Telephone Encounter (Signed)
 Lvm notifying patient of mammogram appointment date, arrival time, location-Toni

## 2024-05-14 ENCOUNTER — Encounter

## 2024-05-15 ENCOUNTER — Telehealth: Payer: Self-pay

## 2024-05-15 NOTE — Progress Notes (Signed)
   05/15/2024  Patient ID: Arland LULLA Louder, female   DOB: 1935-06-16, 89 y.o.   MRN: [id number] This patient is appearing on a report for being at risk of failing the adherence measure for identified medications this calendar year.   Medication Adherence Summary (STAR/HEDIS Monitoring): Adherence Category: hypertension (ACEi/ARB)    Drug Name: Losartan  50 mg  Last Fill or Sold Date: 11/27/2023  Days' Supply: 100 - I called [facility] to confirm medication has only been filled once this year.  - Per chart review, patient should still be on medication.     Notes: ? Adherence data pulled from pharmacy claims portal Dr. Annemarie. ? Plan: MyChart message sent to patient.  Dorcas Solian, PharmD Clinical Pharmacist Cell: [phone]

## 2024-05-27 ENCOUNTER — Ambulatory Visit
Admission: RE | Admit: 2024-05-27 | Discharge: 2024-05-27 | Disposition: A | Discharge status: Home / Self Care | Source: Ambulatory Visit | Attending: Nurse Practitioner | Admitting: Nurse Practitioner

## 2024-05-27 DIAGNOSIS — Z1231 Encounter for screening mammogram for malignant neoplasm of breast: Secondary | ICD-10-CM | POA: Diagnosis not present

## 2024-05-31 ENCOUNTER — Encounter: Payer: Self-pay | Admitting: Nurse Practitioner

## 2024-06-09 ENCOUNTER — Telehealth: Payer: Self-pay

## 2024-06-12 DIAGNOSIS — E871 Hypo-osmolality and hyponatremia: Secondary | ICD-10-CM | POA: Diagnosis not present

## 2024-06-12 DIAGNOSIS — E559 Vitamin D deficiency, unspecified: Secondary | ICD-10-CM | POA: Diagnosis not present

## 2024-06-12 DIAGNOSIS — I10 Essential (primary) hypertension: Secondary | ICD-10-CM | POA: Diagnosis not present

## 2024-06-12 DIAGNOSIS — E039 Hypothyroidism, unspecified: Secondary | ICD-10-CM | POA: Diagnosis not present

## 2024-06-13 LAB — CBC WITH DIFFERENTIAL/PLATELET
Basophils Absolute: 0 x10E3/uL (ref 0.0–0.2)
Basos: 1 %
EOS (ABSOLUTE): 0.1 x10E3/uL (ref 0.0–0.4)
Eos: 2 %
Hematocrit: 39 % (ref 34.0–46.6)
Hemoglobin: 12.9 g/dL (ref 11.1–15.9)
Immature Grans (Abs): 0 x10E3/uL (ref 0.0–0.1)
Immature Granulocytes: 0 %
Lymphocytes Absolute: 1.4 x10E3/uL (ref 0.7–3.1)
Lymphs: 27 %
MCH: 31.7 pg (ref 26.6–33.0)
MCHC: 33.1 g/dL (ref 31.5–35.7)
MCV: 96 fL (ref 79–97)
Monocytes Absolute: 0.5 x10E3/uL (ref 0.1–0.9)
Monocytes: 10 %
Neutrophils Absolute: 3.2 x10E3/uL (ref 1.4–7.0)
Neutrophils: 59 %
Platelets: 311 x10E3/uL (ref 150–450)
RBC: 4.07 x10E6/uL (ref 3.77–5.28)
RDW: 12.8 % (ref 11.7–15.4)
WBC: 5.2 x10E3/uL (ref 3.4–10.8)

## 2024-06-13 LAB — CMP14+EGFR
ALT: 24 IU/L (ref 0–32)
AST: 31 IU/L (ref 0–40)
Albumin: 4.8 g/dL — ABNORMAL HIGH (ref 3.7–4.7)
Alkaline Phosphatase: 96 IU/L (ref 44–121)
BUN/Creatinine Ratio: 20 (ref 12–28)
BUN: 19 mg/dL (ref 8–27)
Bilirubin Total: 0.5 mg/dL (ref 0.0–1.2)
CO2: 24 mmol/L (ref 20–29)
Calcium: 10.1 mg/dL (ref 8.7–10.3)
Chloride: 103 mmol/L (ref 96–106)
Creatinine, Ser: 0.93 mg/dL (ref 0.57–1.00)
Globulin, Total: 2.2 g/dL (ref 1.5–4.5)
Glucose: 92 mg/dL (ref 70–99)
Potassium: 5 mmol/L (ref 3.5–5.2)
Sodium: 141 mmol/L (ref 134–144)
Total Protein: 7 g/dL (ref 6.0–8.5)
eGFR: 59 mL/min/1.73 — ABNORMAL LOW (ref >59)

## 2024-06-13 LAB — LIPID PANEL
Chol/HDL Ratio: 2.3 ratio (ref 0.0–4.4)
Cholesterol, Total: 206 mg/dL — ABNORMAL HIGH (ref 100–199)
HDL: 89 mg/dL (ref >39)
LDL Chol Calc (NIH): 107 mg/dL — ABNORMAL HIGH (ref 0–99)
Triglycerides: 54 mg/dL (ref 0–149)
VLDL Cholesterol Cal: 10 mg/dL (ref 5–40)

## 2024-06-13 LAB — TSH+FREE T4
Free T4: 1.29 ng/dL (ref 0.82–1.77)
TSH: 4.54 u[IU]/mL — ABNORMAL HIGH (ref 0.450–4.500)

## 2024-06-17 NOTE — Telephone Encounter (Signed)
 Pt message to alyssa and as per alyssa she can discuss with pt at next visit

## 2024-08-04 ENCOUNTER — Ambulatory Visit: Admitting: Nurse Practitioner

## 2024-08-04 ENCOUNTER — Encounter: Payer: Self-pay | Admitting: Nurse Practitioner

## 2024-08-04 VITALS — BP 138/70 | HR 82 | Temp 98.0°F | Resp 16 | Ht <= 58 in | Wt 75.0 lb

## 2024-08-04 DIAGNOSIS — I10 Essential (primary) hypertension: Secondary | ICD-10-CM

## 2024-08-04 DIAGNOSIS — E782 Mixed hyperlipidemia: Secondary | ICD-10-CM | POA: Diagnosis not present

## 2024-08-04 DIAGNOSIS — E039 Hypothyroidism, unspecified: Secondary | ICD-10-CM

## 2024-08-04 DIAGNOSIS — F411 Generalized anxiety disorder: Secondary | ICD-10-CM | POA: Diagnosis not present

## 2024-08-04 MED ORDER — CLONAZEPAM 0.5 MG PO TABS: 0.7500 mg | ORAL_TABLET | Freq: Every day | ORAL | 0 refills | Status: AC

## 2024-08-04 NOTE — Progress Notes (Signed)
 [facility] [address] [place], [place] [address]  Internal MEDICINE  Office Visit Note  Patient Name: Pam Cruz  [id number]  [id number]  Date of Service: 08/04/2024  Chief Complaint  Patient presents with   Follow-up   Gastroesophageal Reflux   Hypertension    HPI Pam Cruz presents for a follow-up visit for anxiety, hypertension, hypothyroidism and high cholesterol.  Anxiety -- takes clonazepam  regularly as needed. Due for refills. Current dose is effective.  Hypertension -- controlled with amlodipine  and losartan   Hypothyroidism -- takes levothyroxine  daily High cholesterol -- not currently on any cholesterol medication.  Lab results: slightly elevated LDL 107, slightly elevated TSH 4.540, eGFR 59. The rest of the labs are grossly normal.    Current Medication: Outpatient Encounter Medications as of 08/04/2024  Medication Sig   acetaminophen  (TYLENOL ) 500 MG tablet Take 500 mg by mouth every 6 (six) hours as needed (pain).    amLODipine  (NORVASC ) 5 MG tablet Take 1 tablet (5 mg total) by mouth daily.   azelastine (ASTELIN) 0.1 % nasal spray SMARTSIG:1-2 Spray(s) Both Nares Twice Daily   bimatoprost (LUMIGAN) 0.01 % SOLN INSTILL 1 DROP IN LEFT EYE AT BEDTIME   clobetasol  cream (TEMOVATE ) 0.05 % Apply to itchy rash twice daily until improved. Avoid applying to face, groin, and axilla. Use as directed. Long-term use can cause thinning of the skin.   cloNIDine  (CATAPRES ) 0.1 MG tablet TAKE 1 TABLET BY MOUTH TWICE  DAILY MAY TAKE EXTRA TABLET IF  NEEDED , MAXIMUM 3 TABLETS A DAY   desipramine  (NORPRAMIN ) 25 MG tablet Take 1 tablet (25 mg total) by mouth daily.   Fluticasone -Umeclidin-Vilant (TRELEGY ELLIPTA ) 100-62.5-25 MCG/ACT AEPB Inhale 1 puff into the lungs daily.   hydrALAZINE  (APRESOLINE ) 25 MG tablet Take 1 tablet (25 mg total) by mouth 3 (three) times daily.   levothyroxine  (SYNTHROID ) 50 MCG tablet Take 1 tablet (50 mcg total) by mouth daily.   losartan   (COZAAR ) 50 MG tablet TAKE 1 TABLET BY MOUTH TWICE  DAILY   Multiple Vitamins-Minerals (PRESERVISION AREDS 2+MULTI VIT) CAPS Take 1 capsule by mouth in the morning and at bedtime.   omeprazole (PRILOSEC) 20 MG capsule Take 20 mg by mouth 2 (two) times daily before a meal.    Polyethyl Glycol-Propyl Glycol (SYSTANE) 0.4-0.3 % SOLN Apply to eye.   Polyethylene Glycol 400 (BLINK TEARS) 0.25 % SOLN Apply to eye.   Psyllium (METAMUCIL) 28.3 % POWD Take by mouth.   triamcinolone  cream (KENALOG ) 0.1 % Apply once a day to aa legs on Friday, Saturday and Sundays. Avoid applying to face, groin, and axilla. Use as directed. Long-term use can cause thinning of the skin.   [DISCONTINUED] clonazePAM  (KLONOPIN ) 0.5 MG tablet Take 1.5 tablets (0.75 mg total) by mouth at bedtime.   [DISCONTINUED] tacrolimus  (PROTOPIC ) 0.1 % ointment Apply to itchy red patches QD PRN.   clonazePAM  (KLONOPIN ) 0.5 MG tablet Take 1.5 tablets (0.75 mg total) by mouth at bedtime.   No facility-administered encounter medications on file as of 08/04/2024.    Surgical History: Past Surgical History:  Procedure Laterality Date   COLONOSCOPY     cysto     LAPAROSCOPY     TOTAL HIP ARTHROPLASTY Right 08/17/2019   Procedure: TOTAL HIP ARTHROPLASTY;  Surgeon: Mardee Lynwood SQUIBB, MD;  Location: [facility] ORS;  Service: Orthopedics;  Laterality: Right;    Medical History: Past Medical History:  Diagnosis Date   Anxiety    Arthritis    COPD (chronic obstructive  pulmonary disease) (HCC)    Dyspnea    Gastritis    GERD (gastroesophageal reflux disease)    Hypertension    Osteoporosis     Family History: Family History  Problem Relation Age of Onset   Hypertension Mother    Coronary artery disease Father    Heart disease Father    Breast cancer Neg Hx     Social History   Socioeconomic History   Marital status: Single    Spouse name: Not on file   Number of children: Not on file   Years of education: Not on file   Highest  education level: Not on file  Occupational History   Not on file  Tobacco Use   Smoking status: Former    Current packs/day: 0.00    Types: Cigarettes    Quit date: 02/20/2001    Years since quitting: 23.4   Smokeless tobacco: Never   Tobacco comments:    quit 17 years ago  Vaping Use   Vaping status: Never Used  Substance and Sexual Activity   Alcohol use: Not Currently    Alcohol/week: 2.0 standard drinks of alcohol    Types: 2 Glasses of wine per week    Comment: very rarely   Drug use: Never   Sexual activity: Not on file  Other Topics Concern   Not on file  Social History Narrative   Not on file   Social Drivers of Health   Financial Resource Strain: High Risk (11/29/2023)   Received from [facility]   Overall Financial Resource Strain (CARDIA)    Difficulty of Paying Living Expenses: Hard  Food Insecurity: No Food Insecurity (11/29/2023)   Received from [facility]   Hunger Vital Sign    Within the past 12 months, you worried that your food would run out before you got the money to buy more.: Never true    Within the past 12 months, the food you bought just didn't last and you didn't have money to get more.: Never true  Transportation Needs: No Transportation Needs (11/29/2023)   Received from [facility] - Transportation    In the past 12 months, has lack of transportation kept you from medical appointments or from getting medications?: No    Lack of Transportation (Non-Medical): No  Physical Activity: Not on file  Stress: Not on file  Social Connections: Unknown (02/12/2022)   Received from [facility]   Social Network    Social Network: Not on file  Intimate Partner Violence: Unknown (02/09/2022)   Received from [facility]   HITS    Physically Hurt: Not on file    Insult or Talk Down To: Not on file    Threaten Physical Harm: Not on file    Scream or Curse: Not on file      Review of  Systems  Constitutional:  Negative for chills, fatigue and unexpected weight change.  HENT:  Negative for congestion, postnasal drip, rhinorrhea, sneezing and sore throat.   Eyes:  Negative for redness.  Respiratory:  Negative for cough, chest tightness, shortness of breath and wheezing.   Cardiovascular: Negative.  Negative for chest pain and palpitations.  Gastrointestinal:  Positive for constipation. Negative for abdominal pain, diarrhea, nausea and vomiting.  Genitourinary:  Negative for dysuria and frequency.  Musculoskeletal:  Negative for arthralgias, back pain, joint swelling and neck pain.  Skin:  Negative for rash.  Neurological: Negative.  Negative for tremors  and numbness.  Hematological:  Negative for adenopathy. Does not bruise/bleed easily.  Psychiatric/Behavioral:  Negative for behavioral problems (Depression), self-injury, sleep disturbance and suicidal ideas. The patient is nervous/anxious.     Vital Signs: BP 138/70   Pulse 82   Temp 98 F (36.7 C)   Resp 16   Ht 4' 6 (1.372 m)   Wt 75 lb (34 kg)   SpO2 97%   BMI 18.08 kg/m    Physical Exam Vitals reviewed.  Constitutional:      General: She is not in acute distress.    Appearance: Normal appearance. She is underweight. She is not ill-appearing.  HENT:     Head: Normocephalic and atraumatic.  Eyes:     Pupils: Pupils are equal, round, and reactive to light.  Cardiovascular:     Rate and Rhythm: Normal rate and regular rhythm.  Pulmonary:     Effort: Pulmonary effort is normal. No respiratory distress.  Neurological:     Mental Status: She is alert and oriented to person, place, and time.  Psychiatric:        Mood and Affect: Mood normal.        Behavior: Behavior normal.        Assessment/Plan: 1. Essential hypertension (Primary) Stable, continue amlodipine , clonidine , hydralazine , and losartan  as prescribed.   2. Hypothyroidism, unspecified type Continue levothyroxine  as prescribed.  3.  Mixed hyperlipidemia Not currently on statin therapy, continue low cholesterol diet.   4. GAD (generalized anxiety disorder) Continue prn clonazepam  as prescribed.  - clonazePAM  (KLONOPIN ) 0.5 MG tablet; Take 1.5 tablets (0.75 mg total) by mouth at bedtime.  Dispense: 135 tablet; Refill: 0   General Counseling: shaton lore understanding of the findings of todays visit and agrees with plan of treatment. I have discussed any further diagnostic evaluation that may be needed or ordered today. We also reviewed her medications today. she has been encouraged to call the office with any questions or concerns that should arise related to todays visit.    No orders of the defined types were placed in this encounter.   Meds ordered this encounter  Medications   clonazePAM  (KLONOPIN ) 0.5 MG tablet    Sig: Take 1.5 tablets (0.75 mg total) by mouth at bedtime.    Dispense:  135 tablet    Refill:  0    For next fill    Return in about 3 months (around 10/28/2024) for F/U, anxiety med refill, Seng Fouts PCP.   Total time spent:30 Minutes Time spent includes review of chart, medications, test results, and follow up plan with the patient.   [place] Controlled Substance Database was reviewed by me.  This patient was seen by Mardy Maxin, FNP-C in collaboration with Dr. Sigrid Bathe as a part of collaborative care agreement.   Oakley Orban R. Maxin, MSN, FNP-C Internal medicine

## 2024-08-29 ENCOUNTER — Encounter: Payer: Self-pay | Admitting: Nurse Practitioner

## 2024-08-29 DIAGNOSIS — E782 Mixed hyperlipidemia: Secondary | ICD-10-CM | POA: Insufficient documentation

## 2024-09-08 ENCOUNTER — Other Ambulatory Visit: Payer: Self-pay | Admitting: Nurse Practitioner

## 2024-09-08 DIAGNOSIS — I10 Essential (primary) hypertension: Secondary | ICD-10-CM

## 2024-09-22 ENCOUNTER — Ambulatory Visit: Admitting: Dermatology

## 2024-09-24 ENCOUNTER — Ambulatory Visit: Payer: 59 | Admitting: Dermatology

## 2024-09-24 ENCOUNTER — Other Ambulatory Visit: Payer: Self-pay

## 2024-09-24 ENCOUNTER — Emergency Department

## 2024-09-24 ENCOUNTER — Emergency Department
Admission: EM | Admit: 2024-09-24 | Discharge: 2024-09-24 | Disposition: A | Discharge status: Home / Self Care | Attending: Emergency Medicine | Admitting: Emergency Medicine

## 2024-09-24 ENCOUNTER — Encounter: Payer: Self-pay | Admitting: Intensive Care

## 2024-09-24 DIAGNOSIS — R519 Headache, unspecified: Secondary | ICD-10-CM | POA: Diagnosis not present

## 2024-09-24 DIAGNOSIS — Y92000 Kitchen of unspecified non-institutional (private) residence as  the place of occurrence of the external cause: Secondary | ICD-10-CM | POA: Insufficient documentation

## 2024-09-24 DIAGNOSIS — J449 Chronic obstructive pulmonary disease, unspecified: Secondary | ICD-10-CM | POA: Diagnosis not present

## 2024-09-24 DIAGNOSIS — I10 Essential (primary) hypertension: Secondary | ICD-10-CM | POA: Diagnosis not present

## 2024-09-24 DIAGNOSIS — S40012A Contusion of left shoulder, initial encounter: Secondary | ICD-10-CM | POA: Diagnosis not present

## 2024-09-24 DIAGNOSIS — Z96642 Presence of left artificial hip joint: Secondary | ICD-10-CM | POA: Diagnosis not present

## 2024-09-24 DIAGNOSIS — W01198A Fall on same level from slipping, tripping and stumbling with subsequent striking against other object, initial encounter: Secondary | ICD-10-CM | POA: Insufficient documentation

## 2024-09-24 DIAGNOSIS — E039 Hypothyroidism, unspecified: Secondary | ICD-10-CM | POA: Insufficient documentation

## 2024-09-24 DIAGNOSIS — S4992XA Unspecified injury of left shoulder and upper arm, initial encounter: Secondary | ICD-10-CM | POA: Diagnosis present

## 2024-09-24 DIAGNOSIS — S7002XA Contusion of left hip, initial encounter: Secondary | ICD-10-CM | POA: Diagnosis not present

## 2024-09-24 DIAGNOSIS — W19XXXA Unspecified fall, initial encounter: Secondary | ICD-10-CM

## 2024-09-24 LAB — COMPREHENSIVE METABOLIC PANEL WITH GFR
ALT: 27 U/L (ref 0–44)
AST: 41 U/L (ref 15–41)
Albumin: 4.3 g/dL (ref 3.5–5.0)
Alkaline Phosphatase: 122 U/L (ref 38–126)
Anion gap: 11 (ref 5–15)
BUN: 17 mg/dL (ref 8–23)
CO2: 24 mmol/L (ref 22–32)
Calcium: 9.9 mg/dL (ref 8.9–10.3)
Chloride: 105 mmol/L (ref 98–111)
Creatinine, Ser: 0.81 mg/dL (ref 0.44–1.00)
GFR, Estimated: >60 mL/min (ref >60)
Glucose, Bld: 107 mg/dL — ABNORMAL HIGH (ref 70–99)
Potassium: 3.7 mmol/L (ref 3.5–5.1)
Sodium: 140 mmol/L (ref 135–145)
Total Bilirubin: 0.4 mg/dL (ref 0.0–1.2)
Total Protein: 6.7 g/dL (ref 6.5–8.1)

## 2024-09-24 LAB — CBC WITH DIFFERENTIAL/PLATELET
Abs Immature Granulocytes: 0.02 K/uL (ref 0.00–0.07)
Basophils Absolute: 0 K/uL (ref 0.0–0.1)
Basophils Relative: 1 %
Eosinophils Absolute: 0 K/uL (ref 0.0–0.5)
Eosinophils Relative: 1 %
HCT: 34.3 % — ABNORMAL LOW (ref 36.0–46.0)
Hemoglobin: 11.4 g/dL — ABNORMAL LOW (ref 12.0–15.0)
Immature Granulocytes: 1 %
Lymphocytes Relative: 18 %
Lymphs Abs: 0.8 K/uL (ref 0.7–4.0)
MCH: 31 pg (ref 26.0–34.0)
MCHC: 33.2 g/dL (ref 30.0–36.0)
MCV: 93.2 fL (ref 80.0–100.0)
Monocytes Absolute: 0.4 K/uL (ref 0.1–1.0)
Monocytes Relative: 10 %
Neutro Abs: 3 K/uL (ref 1.7–7.7)
Neutrophils Relative %: 69 %
Platelets: 260 K/uL (ref 150–400)
RBC: 3.68 MIL/uL — ABNORMAL LOW (ref 3.87–5.11)
RDW: 13.2 % (ref 11.5–15.5)
WBC: 4.3 K/uL (ref 4.0–10.5)
nRBC: 0 % (ref 0.0–0.2)

## 2024-09-24 MED ORDER — ACETAMINOPHEN 325 MG PO TABS
650.0000 mg | ORAL_TABLET | Freq: Once | ORAL | Status: DC
  Filled 2024-09-24: qty 2

## 2024-09-24 MED ORDER — LIDOCAINE 5 % EX PTCH
1.0000 | MEDICATED_PATCH | CUTANEOUS | Status: DC
  Administered 2024-09-24: 1 via TRANSDERMAL
  Filled 2024-09-24: qty 1

## 2024-09-24 NOTE — ED Triage Notes (Signed)
 Patient had fall on Monday due to slippers she had on, causing her to slide and fall to floor. Fell to the floor and slid across kitchen. Head hit refrigerator and c/o left shoulder and hip pain.   A&O x4

## 2024-09-24 NOTE — Discharge Instructions (Signed)
 Your CT scans, blood work, and x-rays are normal.  Please follow-up with your outpatient provider.  You may take Tylenol  per package instructions to help with your symptoms.  Please return for any new, worsening, or changing symptoms or other concerns.  It was a pleasure caring for you today.

## 2024-09-24 NOTE — ED Provider Notes (Signed)
 [facility] Provider Note    Event Date/Time   First MD Initiated Contact with Patient 09/24/24 1209     (approximate)   History   Fall   HPI  Pam Cruz is a 89 y.o. female with a past medical history of generalized anxiety disorder, COPD, hypertension, hyperlipidemia presents today for evaluation of head ache and left hip pain after a fall.  Patient reports that 3 days ago she was wearing slippers that were slippery and she had a mechanical fall in the kitchen and hit her head on the refrigerator and also struck her left shoulder and left hip.  She denies LOC.  She was able to get herself up without difficulty and has been ambulatory since the event.  She reports that she continues to have pain in her head, left shoulder, and left hip since the event so she came to be evaluated.  She has not had any vomiting.  She is not anticoagulated.  No numbness or tingling or weakness.  No chest pain or shortness breath.  No preceding symptoms.  No abdominal pain, nausea, vomiting, diarrhea.  She has not taken anything for her symptoms.  Patient Active Problem List   Diagnosis Date Noted   Mixed hyperlipidemia 08/29/2024   GAD (generalized anxiety disorder) 09/13/2023   Neutropenic fever 01/29/2020   Hyponatremia 01/19/2020   COPD (chronic obstructive pulmonary disease) (HCC)    Anxiety    Gastroenteritis    Hypothyroidism    Right bundle branch block (RBBB) determined by electrocardiography 01/06/2020   Chronic intractable headache 01/06/2020   H/O total hip arthroplasty 08/17/2019   Essential hypertension 06/24/2018   GERD (gastroesophageal reflux disease) 12/10/2014   History of IBS 12/10/2014          Physical Exam   Triage Vital Signs: ED Triage Vitals  Encounter Vitals Group     BP 09/24/24 1111 (!) 194/90     Girls Systolic BP Percentile --      Girls Diastolic BP Percentile --      Boys Systolic BP Percentile --      Boys Diastolic BP  Percentile --      Pulse Rate 09/24/24 1111 74     Resp 09/24/24 1111 20     Temp 09/24/24 1111 97.8 F (36.6 C)     Temp Source 09/24/24 1111 Oral     SpO2 09/24/24 1111 100 %     Weight 09/24/24 1111 77 lb (34.9 kg)     Height 09/24/24 1111 4' 6 (1.372 m)     Head Circumference --      Peak Flow --      Pain Score 09/24/24 1129 6     Pain Loc --      Pain Education --      Exclude from Growth Chart --     Most recent vital signs: Vitals:   09/24/24 1111  BP: (!) 194/90  Pulse: 74  Resp: 20  Temp: 97.8 F (36.6 C)  SpO2: 100%    Physical Exam Vitals and nursing note reviewed.  Constitutional:      General: Awake and alert. No acute distress.    Appearance: Normal appearance. The patient is normal weight.  HENT:     Head: Normocephalic and atraumatic.     Mouth: Mucous membranes are moist.  Eyes:     General: PERRL. Normal EOMs        Right eye: No discharge.  Left eye: No discharge.     Conjunctiva/sclera: Conjunctivae normal.  Cardiovascular:     Rate and Rhythm: Normal rate and regular rhythm.     Pulses: Normal pulses.  Pulmonary:     Effort: Pulmonary effort is normal. No respiratory distress.     Breath sounds: Normal breath sounds.  Abdominal:     Abdomen is soft. There is no abdominal tenderness. No rebound or guarding. No distention. Musculoskeletal:        General: No swelling. Normal range of motion.     Cervical back: Normal range of motion and neck supple. Pelvis stable.  Passive and active range of motion intact without pain.  Negative logroll bilaterally. Normal range of motion of bilateral upper extremities with active and passive range of motion.  Mild tenderness to lateral shoulder joint line.  Negative arm drop. Skin:    General: Skin is warm and dry.     Capillary Refill: Capillary refill takes less than 2 seconds.     Findings: No rash.  Neurological:     Mental Status: The patient is awake and alert.   Neurological: GCS 15  alert and oriented x3 Normal speech, no expressive or receptive aphasia or dysarthria Cranial nerves II through XII intact Normal visual fields 5 out of 5 strength in all 4 extremities with intact sensation throughout No extremity drift Normal finger-to-nose testing, no limb or truncal ataxia    ED Results / Procedures / Treatments   Labs (all labs ordered are listed, but only abnormal results are displayed) Labs Reviewed  CBC WITH DIFFERENTIAL/PLATELET - Abnormal; Notable for the following components:      Result Value   RBC 3.68 (*)    Hemoglobin 11.4 (*)    HCT 34.3 (*)    All other components within normal limits  COMPREHENSIVE METABOLIC PANEL WITH GFR - Abnormal; Notable for the following components:   Glucose, Bld 107 (*)    All other components within normal limits     EKG     RADIOLOGY     PROCEDURES:  Critical Care performed:   Procedures   MEDICATIONS ORDERED IN ED: Medications  lidocaine  (LIDODERM ) 5 % 1 patch (1 patch Transdermal Patch Applied 09/24/24 1430)  acetaminophen  (TYLENOL ) tablet 650 mg (650 mg Oral Not Given 09/24/24 1421)     IMPRESSION / MDM / ASSESSMENT AND PLAN / ED COURSE  I reviewed the triage vital signs and the nursing notes.   Differential diagnosis includes, but is not limited to, contusion, fracture, concussion, intracranial hemorrhage.  Patient is awake and alert, hemodynamically stable and afebrile.  She is nontoxic in appearance.  Labs were obtained in triage and are overall quite reassuring.  CT head and neck obtained per [place] criteria are negative for any acute findings.  X-ray of her shoulder and hip also obtained and are negative for any acute findings.  She has been ambulatory since the fall, do not suspect occult fracture.  She has full active and passive range of motion of all 4 extremities with minimal discomfort.  She declined Tylenol  to extend the Lidoderm  patch.  She is ambulatory around the emergency  department.  We discussed all findings and return precautions.  We also discussed symptomatic management.  Patient understands and agrees with plan.  She was discharged in stable condition.   Patient's presentation is most consistent with acute complicated illness / injury requiring diagnostic workup.   Clinical Course as of 09/24/24 1440  Thu Sep 24, 2024  1406  Patient ambulated out of the room to ask when she can go home [JP]    Clinical Course User Index [JP] Seva Chancy E, PA-C     FINAL CLINICAL IMPRESSION(S) / ED DIAGNOSES   Final diagnoses:  Fall, initial encounter  Contusion of left shoulder, initial encounter  Contusion of left hip, initial encounter     Rx / DC Orders   ED Discharge Orders     None        Note:  This document was prepared using Dragon voice recognition software and may include unintentional dictation errors.   Rhyder Koegel E, PA-C 09/24/24 1440    Waymond Lorelle Cummins, MD 09/24/24 [phone]

## 2024-09-25 ENCOUNTER — Telehealth: Payer: Self-pay | Admitting: Nurse Practitioner

## 2024-09-25 NOTE — Telephone Encounter (Signed)
 Lvm to schedule ED follow up-Toni

## 2024-10-06 ENCOUNTER — Ambulatory Visit: Admitting: Nurse Practitioner

## 2024-10-06 ENCOUNTER — Ambulatory Visit: Admitting: Internal Medicine

## 2024-10-06 ENCOUNTER — Encounter: Payer: Self-pay | Admitting: Nurse Practitioner

## 2024-10-06 VITALS — BP 130/88 | HR 74 | Temp 98.1°F | Resp 16 | Ht <= 58 in | Wt 76.2 lb

## 2024-10-06 DIAGNOSIS — S0990XA Unspecified injury of head, initial encounter: Secondary | ICD-10-CM

## 2024-10-06 DIAGNOSIS — S7002XD Contusion of left hip, subsequent encounter: Secondary | ICD-10-CM | POA: Diagnosis not present

## 2024-10-06 DIAGNOSIS — Z9181 History of falling: Secondary | ICD-10-CM | POA: Diagnosis not present

## 2024-10-06 NOTE — Progress Notes (Signed)
 [facility] [address] [place], [place] [address]  Internal MEDICINE  Office Visit Note  Patient Name: Pam Cruz  [id number]  [id number]  Date of Service: 10/06/2024  Chief Complaint  Patient presents with   Gastroesophageal Reflux   Hypertension   Follow-up    Ed f/u     HPI Pam Cruz presents for a follow-up visit for recent fall with ED visit.  --recent ED visit after falling at home due to wearing worn out slippers --all the imaging was reviewed and she did not sustain any acute fractures.  --noted bruising of the left hip, left side of the head and skin tears to the left forearm and right anterior lower leg.  These are all healing well with no signs of infection.     Current Medication: Outpatient Encounter Medications as of 10/06/2024  Medication Sig   acetaminophen  (TYLENOL ) 500 MG tablet Take 500 mg by mouth every 6 (six) hours as needed (pain).    amLODipine  (NORVASC ) 5 MG tablet Take 1 tablet (5 mg total) by mouth daily.   azelastine (ASTELIN) 0.1 % nasal spray SMARTSIG:1-2 Spray(s) Both Nares Twice Daily   bimatoprost (LUMIGAN) 0.01 % SOLN INSTILL 1 DROP IN LEFT EYE AT BEDTIME   clobetasol  cream (TEMOVATE ) 0.05 % Apply to itchy rash twice daily until improved. Avoid applying to face, groin, and axilla. Use as directed. Long-term use can cause thinning of the skin.   clonazePAM  (KLONOPIN ) 0.5 MG tablet Take 1.5 tablets (0.75 mg total) by mouth at bedtime.   cloNIDine  (CATAPRES ) 0.1 MG tablet TAKE 1 TABLET BY MOUTH TWICE  DAILY MAY TAKE EXTRA TABLET IF  NEEDED , MAXIMUM 3 TABLETS A DAY   desipramine  (NORPRAMIN ) 25 MG tablet Take 1 tablet (25 mg total) by mouth daily.   Fluticasone -Umeclidin-Vilant (TRELEGY ELLIPTA ) 100-62.5-25 MCG/ACT AEPB Inhale 1 puff into the lungs daily.   hydrALAZINE  (APRESOLINE ) 25 MG tablet Take 1 tablet (25 mg total) by mouth 3 (three) times daily.   levothyroxine  (SYNTHROID ) 50 MCG tablet Take 1 tablet (50 mcg total) by mouth  daily.   losartan  (COZAAR ) 50 MG tablet TAKE 1 TABLET BY MOUTH TWICE  DAILY   Multiple Vitamins-Minerals (PRESERVISION AREDS 2+MULTI VIT) CAPS Take 1 capsule by mouth in the morning and at bedtime.   omeprazole (PRILOSEC) 20 MG capsule Take 20 mg by mouth 2 (two) times daily before a meal.    Polyethyl Glycol-Propyl Glycol (SYSTANE) 0.4-0.3 % SOLN Apply to eye.   Polyethylene Glycol 400 (BLINK TEARS) 0.25 % SOLN Apply to eye.   Psyllium (METAMUCIL) 28.3 % POWD Take by mouth.   triamcinolone  cream (KENALOG ) 0.1 % Apply once a day to aa legs on Friday, Saturday and Sundays. Avoid applying to face, groin, and axilla. Use as directed. Long-term use can cause thinning of the skin.   No facility-administered encounter medications on file as of 10/06/2024.    Surgical History: Past Surgical History:  Procedure Laterality Date   COLONOSCOPY     cysto     LAPAROSCOPY     TOTAL HIP ARTHROPLASTY Right 08/17/2019   Procedure: TOTAL HIP ARTHROPLASTY;  Surgeon: Mardee Lynwood SQUIBB, MD;  Location: [facility] ORS;  Service: Orthopedics;  Laterality: Right;    Medical History: Past Medical History:  Diagnosis Date   Anxiety    Arthritis    COPD (chronic obstructive pulmonary disease) (HCC)    Dyspnea    Gastritis    GERD (gastroesophageal reflux disease)    Hypertension  Osteoporosis     Family History: Family History  Problem Relation Age of Onset   Hypertension Mother    Coronary artery disease Father    Heart disease Father    Breast cancer Neg Hx     Social History   Socioeconomic History   Marital status: Single    Spouse name: Not on file   Number of children: Not on file   Years of education: Not on file   Highest education level: Not on file  Occupational History   Not on file  Tobacco Use   Smoking status: Former    Current packs/day: 0.00    Types: Cigarettes    Quit date: 02/20/2001    Years since quitting: 23.6   Smokeless tobacco: Never   Tobacco comments:    quit 17  years ago  Vaping Use   Vaping status: Never Used  Substance and Sexual Activity   Alcohol use: Not Currently    Alcohol/week: 2.0 standard drinks of alcohol    Types: 2 Glasses of wine per week    Comment: very rarely   Drug use: Never   Sexual activity: Not on file  Other Topics Concern   Not on file  Social History Narrative   Not on file   Social Drivers of Health   Tobacco Use: Medium Risk (10/06/2024)   Patient History    Smoking Tobacco Use: Former    Smokeless Tobacco Use: Never    Passive Exposure: Not on file  Financial Resource Strain: High Risk (08/19/2024)   [facility] from [facility]   Overall Financial Resource Strain (CARDIA)    Difficulty of Paying Living Expenses: Hard  Food Insecurity: No Food Insecurity (08/19/2024)   Received from [facility]   Epic    Within the past 12 months, you worried that your food would run out before you got the money to buy more.: Never true    Within the past 12 months, the food you bought just didn't last and you didn't have money to get more.: Never true  Transportation Needs: No Transportation Needs (08/19/2024)   Received from [facility] - Transportation    In the past 12 months, has lack of transportation kept you from medical appointments or from getting medications?: No    Lack of Transportation (Non-Medical): No  Physical Activity: Not on file  Stress: Not on file  Social Connections: Not on file  Intimate Partner Violence: Not on file  Depression (PHQ2-9): Low Risk (08/04/2024)   Depression (PHQ2-9)    PHQ-2 Score: 0  Alcohol Screen: Low Risk (09/19/2021)   Alcohol Screen    Last Alcohol Screening Score (AUDIT): 1  Housing: Low Risk  (08/19/2024)   Received from [facility]   Epic    In the last 12 months, was there a time when you were not able to pay the mortgage or rent on time?: No    In the past 12 months, how many times have you  moved where you were living?: 0    At any time in the past 12 months, were you homeless or living in a shelter (including now)?: No  Utilities: Not At Risk (08/19/2024)   Received from [facility]   Epic    In the past 12 months has the electric, gas, oil, or water company threatened to shut off services in your home?: No  Health Literacy: Not on file  Review of Systems  Constitutional:  Negative for chills, fatigue and unexpected weight change.  HENT:  Negative for congestion, postnasal drip, rhinorrhea, sneezing and sore throat.   Eyes:  Negative for redness.  Respiratory:  Negative for cough, chest tightness, shortness of breath and wheezing.   Cardiovascular: Negative.  Negative for chest pain and palpitations.  Gastrointestinal:  Positive for constipation. Negative for abdominal pain, diarrhea, nausea and vomiting.  Genitourinary:  Negative for dysuria and frequency.  Musculoskeletal:  Negative for arthralgias, back pain, joint swelling and neck pain.  Skin:  Negative for rash.  Neurological: Negative.  Negative for tremors and numbness.  Hematological:  Negative for adenopathy. Does not bruise/bleed easily.  Psychiatric/Behavioral:  Negative for behavioral problems (Depression), self-injury, sleep disturbance and suicidal ideas. The patient is nervous/anxious.     Vital Signs: BP 130/88   Pulse 74   Temp 98.1 F (36.7 C)   Resp 16   Ht 4' 6 (1.372 m)   Wt 76 lb 3.2 oz (34.6 kg)   SpO2 95%   BMI 18.37 kg/m    Physical Exam Vitals reviewed.  Constitutional:      General: She is not in acute distress.    Appearance: Normal appearance. She is underweight. She is not ill-appearing.  HENT:     Head: Normocephalic and atraumatic.  Eyes:     Pupils: Pupils are equal, round, and reactive to light.  Cardiovascular:     Rate and Rhythm: Normal rate and regular rhythm.  Pulmonary:     Effort: Pulmonary effort is normal. No respiratory distress.   Neurological:     Mental Status: She is alert and oriented to person, place, and time.  Psychiatric:        Mood and Affect: Mood normal.        Behavior: Behavior normal.        Assessment/Plan: 1. Minor traumatic injury of head with normal mental status (Primary) Imaging of head on Ct scan was normal. No signs of concussion on clinical exam. No issues.   2. Contusion of left hip, subsequent encounter Resolved, no bruise noted.   3. History of recent fall Recent fall, patient counseled about appropriate footwear in the home and has switched out her worn out slippers for better ones with a solid bottom that is less likely to cause her to slip or trip    General Counseling: alexica schlossberg understanding of the findings of todays visit and agrees with plan of treatment. I have discussed any further diagnostic evaluation that may be needed or ordered today. We also reviewed her medications today. she has been encouraged to call the office with any questions or concerns that should arise related to todays visit.    No orders of the defined types were placed in this encounter.   No orders of the defined types were placed in this encounter.   Return if symptoms worsen or fail to improve.   Total time spent:30 Minutes Time spent includes review of chart, medications, test results, and follow up plan with the patient.   [place] Controlled Substance Database was reviewed by me.  This patient was seen by Mardy Maxin, FNP-C in collaboration with Dr. Sigrid Bathe as a part of collaborative care agreement.   Quamaine Webb R. Maxin, MSN, FNP-C Internal medicine

## 2024-10-07 ENCOUNTER — Other Ambulatory Visit: Payer: Self-pay | Admitting: Nurse Practitioner

## 2024-10-07 DIAGNOSIS — Z79899 Other long term (current) drug therapy: Secondary | ICD-10-CM

## 2024-10-27 ENCOUNTER — Ambulatory Visit: Admitting: Nurse Practitioner

## 2024-11-09 ENCOUNTER — Other Ambulatory Visit: Payer: Self-pay | Admitting: Nurse Practitioner

## 2024-11-09 DIAGNOSIS — F411 Generalized anxiety disorder: Secondary | ICD-10-CM

## 2024-11-09 DIAGNOSIS — Z79899 Other long term (current) drug therapy: Secondary | ICD-10-CM

## 2024-11-20 ENCOUNTER — Other Ambulatory Visit: Payer: Self-pay

## 2024-11-20 ENCOUNTER — Telehealth: Payer: Self-pay

## 2024-11-20 DIAGNOSIS — Z79899 Other long term (current) drug therapy: Secondary | ICD-10-CM

## 2024-11-20 MED ORDER — DESIPRAMINE HCL 25 MG PO TABS: 25.0000 mg | ORAL_TABLET | Freq: Every day | ORAL | 0 refills | Status: AC

## 2024-11-20 NOTE — Telephone Encounter (Signed)
 Lmom that we sent 10 tab to [facility]

## 2025-02-02 ENCOUNTER — Encounter: Admitting: Dermatology

## 2025-05-03 ENCOUNTER — Ambulatory Visit: Admitting: Nurse Practitioner
